# Patient Record
Sex: Female | Born: 1945 | Race: Black or African American | Hispanic: No | Smoking: Former smoker
Health system: Southern US, Community
[De-identification: ages and names within clinical notes are randomized; demographics above are authoritative.]

## PROBLEM LIST (undated history)

## (undated) DIAGNOSIS — J449 Chronic obstructive pulmonary disease, unspecified: Secondary | ICD-10-CM

## (undated) DIAGNOSIS — J45909 Unspecified asthma, uncomplicated: Secondary | ICD-10-CM

## (undated) DIAGNOSIS — K219 Gastro-esophageal reflux disease without esophagitis: Secondary | ICD-10-CM

## (undated) DIAGNOSIS — I10 Essential (primary) hypertension: Secondary | ICD-10-CM

## (undated) DIAGNOSIS — I639 Cerebral infarction, unspecified: Secondary | ICD-10-CM

## (undated) HISTORY — PX: TUBAL LIGATION: SHX77

---

## 2010-10-10 ENCOUNTER — Inpatient Hospital Stay: Payer: Self-pay | Admitting: Internal Medicine

## 2010-10-10 DIAGNOSIS — I517 Cardiomegaly: Secondary | ICD-10-CM

## 2014-11-18 DIAGNOSIS — I10 Essential (primary) hypertension: Secondary | ICD-10-CM | POA: Insufficient documentation

## 2014-11-18 DIAGNOSIS — I639 Cerebral infarction, unspecified: Secondary | ICD-10-CM | POA: Insufficient documentation

## 2014-11-19 DIAGNOSIS — J4520 Mild intermittent asthma, uncomplicated: Secondary | ICD-10-CM | POA: Insufficient documentation

## 2014-11-19 DIAGNOSIS — E782 Mixed hyperlipidemia: Secondary | ICD-10-CM | POA: Insufficient documentation

## 2014-11-19 DIAGNOSIS — G451 Carotid artery syndrome (hemispheric): Secondary | ICD-10-CM | POA: Insufficient documentation

## 2015-05-09 DIAGNOSIS — Z87891 Personal history of nicotine dependence: Secondary | ICD-10-CM | POA: Diagnosis not present

## 2015-05-09 DIAGNOSIS — J4520 Mild intermittent asthma, uncomplicated: Secondary | ICD-10-CM | POA: Diagnosis not present

## 2015-05-09 DIAGNOSIS — Z8673 Personal history of transient ischemic attack (TIA), and cerebral infarction without residual deficits: Secondary | ICD-10-CM | POA: Diagnosis not present

## 2015-05-09 DIAGNOSIS — Z880 Allergy status to penicillin: Secondary | ICD-10-CM | POA: Diagnosis not present

## 2015-05-09 DIAGNOSIS — E782 Mixed hyperlipidemia: Secondary | ICD-10-CM | POA: Diagnosis not present

## 2015-05-09 DIAGNOSIS — Z79899 Other long term (current) drug therapy: Secondary | ICD-10-CM | POA: Diagnosis not present

## 2015-05-09 DIAGNOSIS — R0600 Dyspnea, unspecified: Secondary | ICD-10-CM | POA: Diagnosis not present

## 2015-05-09 DIAGNOSIS — Z7951 Long term (current) use of inhaled steroids: Secondary | ICD-10-CM | POA: Diagnosis not present

## 2015-05-09 DIAGNOSIS — I6330 Cerebral infarction due to thrombosis of unspecified cerebral artery: Secondary | ICD-10-CM | POA: Diagnosis not present

## 2015-05-09 DIAGNOSIS — I10 Essential (primary) hypertension: Secondary | ICD-10-CM | POA: Diagnosis not present

## 2015-05-09 DIAGNOSIS — R0609 Other forms of dyspnea: Secondary | ICD-10-CM | POA: Diagnosis not present

## 2015-05-09 DIAGNOSIS — Z6841 Body Mass Index (BMI) 40.0 and over, adult: Secondary | ICD-10-CM | POA: Diagnosis not present

## 2015-05-09 DIAGNOSIS — Z7902 Long term (current) use of antithrombotics/antiplatelets: Secondary | ICD-10-CM | POA: Diagnosis not present

## 2015-05-09 DIAGNOSIS — G451 Carotid artery syndrome (hemispheric): Secondary | ICD-10-CM | POA: Diagnosis not present

## 2015-05-10 DIAGNOSIS — R0609 Other forms of dyspnea: Secondary | ICD-10-CM | POA: Diagnosis not present

## 2015-05-10 DIAGNOSIS — G4736 Sleep related hypoventilation in conditions classified elsewhere: Secondary | ICD-10-CM | POA: Diagnosis not present

## 2015-05-10 DIAGNOSIS — J4520 Mild intermittent asthma, uncomplicated: Secondary | ICD-10-CM | POA: Diagnosis not present

## 2015-05-10 DIAGNOSIS — G4733 Obstructive sleep apnea (adult) (pediatric): Secondary | ICD-10-CM | POA: Diagnosis not present

## 2015-05-18 DIAGNOSIS — R0602 Shortness of breath: Secondary | ICD-10-CM | POA: Diagnosis not present

## 2015-05-18 DIAGNOSIS — Z8673 Personal history of transient ischemic attack (TIA), and cerebral infarction without residual deficits: Secondary | ICD-10-CM | POA: Diagnosis not present

## 2015-05-18 DIAGNOSIS — I10 Essential (primary) hypertension: Secondary | ICD-10-CM | POA: Diagnosis not present

## 2015-05-18 DIAGNOSIS — I272 Other secondary pulmonary hypertension: Secondary | ICD-10-CM | POA: Diagnosis not present

## 2015-05-18 DIAGNOSIS — I2510 Atherosclerotic heart disease of native coronary artery without angina pectoris: Secondary | ICD-10-CM | POA: Diagnosis not present

## 2015-05-18 DIAGNOSIS — R0600 Dyspnea, unspecified: Secondary | ICD-10-CM | POA: Diagnosis not present

## 2015-05-18 DIAGNOSIS — E782 Mixed hyperlipidemia: Secondary | ICD-10-CM | POA: Diagnosis not present

## 2015-05-24 ENCOUNTER — Emergency Department (HOSPITAL_BASED_OUTPATIENT_CLINIC_OR_DEPARTMENT_OTHER)
Admission: EM | Admit: 2015-05-24 | Discharge: 2015-05-24 | Disposition: A | Discharge status: Home / Self Care | Payer: PPO | Attending: Emergency Medicine | Admitting: Emergency Medicine

## 2015-05-24 ENCOUNTER — Emergency Department (HOSPITAL_BASED_OUTPATIENT_CLINIC_OR_DEPARTMENT_OTHER): Payer: PPO

## 2015-05-24 ENCOUNTER — Encounter (HOSPITAL_BASED_OUTPATIENT_CLINIC_OR_DEPARTMENT_OTHER): Payer: Self-pay | Admitting: *Deleted

## 2015-05-24 DIAGNOSIS — Z8719 Personal history of other diseases of the digestive system: Secondary | ICD-10-CM | POA: Insufficient documentation

## 2015-05-24 DIAGNOSIS — R0789 Other chest pain: Secondary | ICD-10-CM | POA: Diagnosis not present

## 2015-05-24 DIAGNOSIS — I10 Essential (primary) hypertension: Secondary | ICD-10-CM | POA: Diagnosis not present

## 2015-05-24 DIAGNOSIS — Z8673 Personal history of transient ischemic attack (TIA), and cerebral infarction without residual deficits: Secondary | ICD-10-CM | POA: Diagnosis not present

## 2015-05-24 DIAGNOSIS — Z79899 Other long term (current) drug therapy: Secondary | ICD-10-CM | POA: Diagnosis not present

## 2015-05-24 DIAGNOSIS — R062 Wheezing: Secondary | ICD-10-CM | POA: Diagnosis not present

## 2015-05-24 DIAGNOSIS — R0602 Shortness of breath: Secondary | ICD-10-CM | POA: Diagnosis not present

## 2015-05-24 DIAGNOSIS — Z91040 Latex allergy status: Secondary | ICD-10-CM | POA: Diagnosis not present

## 2015-05-24 DIAGNOSIS — R05 Cough: Secondary | ICD-10-CM | POA: Diagnosis not present

## 2015-05-24 DIAGNOSIS — Z7902 Long term (current) use of antithrombotics/antiplatelets: Secondary | ICD-10-CM | POA: Diagnosis not present

## 2015-05-24 HISTORY — DX: Gastro-esophageal reflux disease without esophagitis: K21.9

## 2015-05-24 HISTORY — DX: Essential (primary) hypertension: I10

## 2015-05-24 HISTORY — DX: Cerebral infarction, unspecified: I63.9

## 2015-05-24 LAB — BASIC METABOLIC PANEL
Anion gap: 8 (ref 5–15)
BUN: 12 mg/dL (ref 6–20)
CHLORIDE: 99 mmol/L — AB (ref 101–111)
CO2: 27 mmol/L (ref 22–32)
Calcium: 8.6 mg/dL — ABNORMAL LOW (ref 8.9–10.3)
Creatinine, Ser: 0.83 mg/dL (ref 0.44–1.00)
GFR calc Af Amer: >60 mL/min (ref >60)
GFR calc non Af Amer: >60 mL/min (ref >60)
Glucose, Bld: 100 mg/dL — ABNORMAL HIGH (ref 65–99)
POTASSIUM: 3.7 mmol/L (ref 3.5–5.1)
SODIUM: 134 mmol/L — AB (ref 135–145)

## 2015-05-24 LAB — CBC WITH DIFFERENTIAL/PLATELET
Basophils Absolute: 0.1 10*3/uL (ref 0.0–0.1)
Basophils Relative: 1 %
EOS ABS: 0.3 10*3/uL (ref 0.0–0.7)
Eosinophils Relative: 5 %
HCT: 40 % (ref 36.0–46.0)
HEMOGLOBIN: 13.1 g/dL (ref 12.0–15.0)
LYMPHS PCT: 30 %
Lymphs Abs: 1.9 10*3/uL (ref 0.7–4.0)
MCH: 30.2 pg (ref 26.0–34.0)
MCHC: 32.8 g/dL (ref 30.0–36.0)
MCV: 92.2 fL (ref 78.0–100.0)
Monocytes Absolute: 0.9 10*3/uL (ref 0.1–1.0)
Monocytes Relative: 13 %
NEUTROS ABS: 3.4 10*3/uL (ref 1.7–7.7)
NEUTROS PCT: 51 %
Platelets: 365 10*3/uL (ref 150–400)
RBC: 4.34 MIL/uL (ref 3.87–5.11)
RDW: 11.8 % (ref 11.5–15.5)
WBC: 6.6 10*3/uL (ref 4.0–10.5)

## 2015-05-24 LAB — TROPONIN I

## 2015-05-24 MED ORDER — ALBUTEROL SULFATE HFA 108 (90 BASE) MCG/ACT IN AERS
2.0000 | INHALATION_SPRAY | RESPIRATORY_TRACT | Status: DC | PRN
  Administered 2015-05-24: 2 via RESPIRATORY_TRACT
  Filled 2015-05-24: qty 6.7

## 2015-05-24 MED ORDER — PREDNISONE 20 MG PO TABS: ORAL_TABLET | ORAL | Status: DC

## 2015-05-24 MED ORDER — ALBUTEROL SULFATE (2.5 MG/3ML) 0.083% IN NEBU
5.0000 mg | INHALATION_SOLUTION | Freq: Once | RESPIRATORY_TRACT | Status: AC
  Administered 2015-05-24: 5 mg via RESPIRATORY_TRACT
  Filled 2015-05-24: qty 6

## 2015-05-24 MED ORDER — METHYLPREDNISOLONE SODIUM SUCC 125 MG IJ SOLR
125.0000 mg | Freq: Once | INTRAMUSCULAR | Status: AC
  Administered 2015-05-24: 125 mg via INTRAVENOUS
  Filled 2015-05-24: qty 2

## 2015-05-24 NOTE — Progress Notes (Signed)
Patient stated that using the spacer with the albuterol MDI helped her get more of the medicine.
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
## 2015-05-24 NOTE — Discharge Instructions (Signed)
Take the prescribed medication as directed.  Continue using inhaler as needed. Follow-up with your primary care physician. Return to the ED for new or worsening symptoms.
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
## 2015-05-28 DIAGNOSIS — G4733 Obstructive sleep apnea (adult) (pediatric): Secondary | ICD-10-CM | POA: Diagnosis not present

## 2015-05-28 DIAGNOSIS — M810 Age-related osteoporosis without current pathological fracture: Secondary | ICD-10-CM | POA: Diagnosis not present

## 2015-05-28 DIAGNOSIS — G458 Other transient cerebral ischemic attacks and related syndromes: Secondary | ICD-10-CM | POA: Diagnosis not present

## 2015-05-28 DIAGNOSIS — R05 Cough: Secondary | ICD-10-CM | POA: Diagnosis not present

## 2015-05-28 DIAGNOSIS — I272 Other secondary pulmonary hypertension: Secondary | ICD-10-CM | POA: Diagnosis not present

## 2015-06-06 DIAGNOSIS — Z91040 Latex allergy status: Secondary | ICD-10-CM | POA: Diagnosis not present

## 2015-06-06 DIAGNOSIS — E782 Mixed hyperlipidemia: Secondary | ICD-10-CM | POA: Diagnosis not present

## 2015-06-06 DIAGNOSIS — I10 Essential (primary) hypertension: Secondary | ICD-10-CM | POA: Diagnosis not present

## 2015-06-06 DIAGNOSIS — I272 Other secondary pulmonary hypertension: Secondary | ICD-10-CM | POA: Diagnosis not present

## 2015-06-06 DIAGNOSIS — Z880 Allergy status to penicillin: Secondary | ICD-10-CM | POA: Diagnosis not present

## 2015-06-06 DIAGNOSIS — R0600 Dyspnea, unspecified: Secondary | ICD-10-CM | POA: Diagnosis not present

## 2015-06-06 DIAGNOSIS — Z7951 Long term (current) use of inhaled steroids: Secondary | ICD-10-CM | POA: Diagnosis not present

## 2015-06-06 DIAGNOSIS — I6330 Cerebral infarction due to thrombosis of unspecified cerebral artery: Secondary | ICD-10-CM | POA: Diagnosis not present

## 2015-06-06 DIAGNOSIS — Z87891 Personal history of nicotine dependence: Secondary | ICD-10-CM | POA: Diagnosis not present

## 2015-06-06 DIAGNOSIS — R0609 Other forms of dyspnea: Secondary | ICD-10-CM | POA: Diagnosis not present

## 2015-06-06 DIAGNOSIS — Z8673 Personal history of transient ischemic attack (TIA), and cerebral infarction without residual deficits: Secondary | ICD-10-CM | POA: Diagnosis not present

## 2015-06-06 DIAGNOSIS — Z79899 Other long term (current) drug therapy: Secondary | ICD-10-CM | POA: Diagnosis not present

## 2015-06-06 DIAGNOSIS — Z888 Allergy status to other drugs, medicaments and biological substances status: Secondary | ICD-10-CM | POA: Diagnosis not present

## 2015-06-06 DIAGNOSIS — Z7982 Long term (current) use of aspirin: Secondary | ICD-10-CM | POA: Diagnosis not present

## 2015-06-20 ENCOUNTER — Emergency Department (HOSPITAL_BASED_OUTPATIENT_CLINIC_OR_DEPARTMENT_OTHER)
Admission: EM | Admit: 2015-06-20 | Discharge: 2015-06-20 | Disposition: A | Discharge status: Home / Self Care | Payer: PPO | Attending: Emergency Medicine | Admitting: Emergency Medicine

## 2015-06-20 ENCOUNTER — Encounter (HOSPITAL_BASED_OUTPATIENT_CLINIC_OR_DEPARTMENT_OTHER): Payer: Self-pay

## 2015-06-20 DIAGNOSIS — Z7902 Long term (current) use of antithrombotics/antiplatelets: Secondary | ICD-10-CM | POA: Insufficient documentation

## 2015-06-20 DIAGNOSIS — I10 Essential (primary) hypertension: Secondary | ICD-10-CM | POA: Insufficient documentation

## 2015-06-20 DIAGNOSIS — Z8673 Personal history of transient ischemic attack (TIA), and cerebral infarction without residual deficits: Secondary | ICD-10-CM | POA: Diagnosis not present

## 2015-06-20 DIAGNOSIS — J9801 Acute bronchospasm: Secondary | ICD-10-CM | POA: Diagnosis not present

## 2015-06-20 DIAGNOSIS — R0602 Shortness of breath: Secondary | ICD-10-CM | POA: Diagnosis not present

## 2015-06-20 DIAGNOSIS — Z91040 Latex allergy status: Secondary | ICD-10-CM | POA: Diagnosis not present

## 2015-06-20 DIAGNOSIS — Z8719 Personal history of other diseases of the digestive system: Secondary | ICD-10-CM | POA: Insufficient documentation

## 2015-06-20 DIAGNOSIS — R05 Cough: Secondary | ICD-10-CM | POA: Diagnosis not present

## 2015-06-20 DIAGNOSIS — Z79899 Other long term (current) drug therapy: Secondary | ICD-10-CM | POA: Insufficient documentation

## 2015-06-20 MED ORDER — ALBUTEROL SULFATE (2.5 MG/3ML) 0.083% IN NEBU
2.5000 mg | INHALATION_SOLUTION | Freq: Once | RESPIRATORY_TRACT | Status: AC
  Administered 2015-06-20: 2.5 mg via RESPIRATORY_TRACT
  Filled 2015-06-20: qty 3

## 2015-06-20 MED ORDER — PREDNISONE 20 MG PO TABS: ORAL_TABLET | ORAL | Status: DC

## 2015-06-20 MED ORDER — ALBUTEROL SULFATE HFA 108 (90 BASE) MCG/ACT IN AERS
2.0000 | INHALATION_SPRAY | RESPIRATORY_TRACT | Status: DC | PRN
  Administered 2015-06-20: 2 via RESPIRATORY_TRACT
  Filled 2015-06-20: qty 6.7

## 2015-06-20 MED ORDER — IPRATROPIUM-ALBUTEROL 0.5-2.5 (3) MG/3ML IN SOLN
3.0000 mL | Freq: Once | RESPIRATORY_TRACT | Status: AC
  Administered 2015-06-20: 3 mL via RESPIRATORY_TRACT
  Filled 2015-06-20: qty 3

## 2015-06-20 NOTE — Discharge Instructions (Signed)
Bronchospasm, Adult  A bronchospasm is a spasm or tightening of the airways going into the lungs. During a bronchospasm breathing becomes more difficult because the airways get smaller. When this happens there can be coughing, a whistling sound when breathing (wheezing), and difficulty breathing. Bronchospasm is often associated with asthma, but not all patients who experience a bronchospasm have asthma.  CAUSES   A bronchospasm is caused by inflammation or irritation of the airways. The inflammation or irritation may be triggered by:   · Allergies (such as to animals, pollen, food, or mold). Allergens that cause bronchospasm may cause wheezing immediately after exposure or many hours later.    · Infection. Viral infections are believed to be the most common cause of bronchospasm.    · Exercise.    · Irritants (such as pollution, cigarette smoke, strong odors, aerosol sprays, and paint fumes).    · Weather changes. Winds increase molds and pollens in the air. Rain refreshes the air by washing irritants out. Cold air may cause inflammation.    · Stress and emotional upset.    SIGNS AND SYMPTOMS   · Wheezing.    · Excessive nighttime coughing.    · Frequent or severe coughing with a simple cold.    · Chest tightness.    · Shortness of breath.    DIAGNOSIS   Bronchospasm is usually diagnosed through a history and physical exam. Tests, such as chest X-rays, are sometimes done to look for other conditions.  TREATMENT   · Inhaled medicines can be given to open up your airways and help you breathe. The medicines can be given using either an inhaler or a nebulizer machine.  · Corticosteroid medicines may be given for severe bronchospasm, usually when it is associated with asthma.  HOME CARE INSTRUCTIONS   · Always have a plan prepared for seeking medical care. Know when to call your health care provider and local emergency services (911 in the [place] Know where you can access local emergency care.  · Only take medicines as  directed by your health care provider.  · If you were prescribed an inhaler or nebulizer machine, ask your health care provider to explain how to use it correctly. Always use a spacer with your inhaler if you were given one.  · It is necessary to remain calm during an attack. Try to relax and breathe more slowly.   · Control your home environment in the following ways:      Change your heating and air conditioning filter at least once a month.      Limit your use of fireplaces and wood stoves.    Do not smoke and do not allow smoking in your home.      Avoid exposure to perfumes and fragrances.      Get rid of pests (such as roaches and mice) and their droppings.      Throw away plants if you see mold on them.      Keep your house clean and dust free.      Replace carpet with wood, tile, or vinyl flooring. Carpet can trap dander and dust.      Use allergy-proof pillows, mattress covers, and box spring covers.      Wash bed sheets and blankets every week in hot water and dry them in a dryer.      Use blankets that are made of polyester or cotton.      Wash hands frequently.  SEEK MEDICAL CARE IF:   · You have muscle aches.    · You have chest pain.    · The sputum changes from clear or   white to yellow, green, gray, or bloody.    · The sputum you cough up gets thicker.    · There are problems that may be related to the medicine you are given, such as a rash, itching, swelling, or trouble breathing.    SEEK IMMEDIATE MEDICAL CARE IF:   · You have worsening wheezing and coughing even after taking your prescribed medicines.    · You have increased difficulty breathing.    · You develop severe chest pain.  MAKE SURE YOU:   · Understand these instructions.  · Will watch your condition.  · Will get help right away if you are not doing well or get worse.     This information is not intended to replace advice given to you by your health care provider. Make sure you discuss any questions you have with your health care  provider.     Document Released: 03/20/2003 Document Revised: 04/07/2014 Document Reviewed: 09/06/2012  Elsevier Interactive Patient Education ©2016 [employer]

## 2015-06-20 NOTE — ED Notes (Signed)
Pt c/o non productive cough with SOB x 2months, states has used her inhaler with no relief;

## 2015-06-20 NOTE — ED Provider Notes (Signed)
CSN: [id number]     Arrival date & time 06/20/15  0408 History   First MD Initiated Contact with Patient 06/20/15 0654     Chief Complaint  Patient presents with  . Shortness of Breath     (Consider location/radiation/quality/duration/timing/severity/associated sxs/prior Treatment) HPI This is a 70 year old female who has had a nonproductive cough and shortness of breath over the past 3 months. She was seen here a month ago for wheezing and shortness of breath and was treated with albuterol on a prednisone taper. Her breathing acutely worsened yesterday and she was not getting relief with her albuterol inhaler. She suspects her out inhaler is out. Symptoms were moderate to severe. She was evaluated by respiratory therapy who found her to have increased work of breathing and wheezing. She was given a total of 3 neb treatments with significant improvement and she has now comfortable and without wheezing. She states her chest is sore from coughing.  Past Medical History  Diagnosis Date  . Hypertension   . GERD (gastroesophageal reflux disease)   . Stroke [facility](H[facility])    Past Surgical History  Procedure Laterality Date  . Tubal ligation     No family history on file. Social History  Substance Use Topics  . Smoking status: Never Smoker   . Smokeless tobacco: None  . Alcohol Use: No   OB History    No data available     Review of Systems  All other systems reviewed and are negative.   Allergies  Latex  Home Medications   Prior to Admission medications   Medication Sig Start Date End Date Taking? Authorizing Provider  ALBUTEROL IN Inhale into the lungs.    Historical Provider, MD  AmLODIPine Besylate (NORVASC PO) Take by mouth.    Historical Provider, MD  Clopidogrel Bisulfate (PLAVIX PO) Take by mouth.    Historical Provider, MD  LOSARTAN POTASSIUM PO Take by mouth.    Historical Provider, MD  predniSONE (DELTASONE) 20 MG tablet Take 40 mg by mouth daily for 3 days, then 20mg  by  mouth daily for 3 days, then 10mg  daily for 3 days 05/24/15   Garlon HatchetLisa M Sanders, PA-C  tiotropium (SPIRIVA) 18 MCG inhalation capsule Place 18 mcg into inhaler and inhale daily.    Historical Provider, MD   BP 156/63 mmHg  Pulse 78  Temp(Src) 98.2 F (36.8 C) (Oral)  Resp 20  Ht 5\' 6"  (1.676 m)  Wt 268 lb (121.564 kg)  BMI 43.28 kg/m2  SpO2 95%   Physical Exam  General: Well-developed, well-nourished female in no acute distress; appearance consistent with age of record HENT: normocephalic; atraumatic Eyes: pupils equal, round and reactive to light; extraocular muscles intact Neck: supple Heart: regular rate and rhythm Lungs: clear to auscultation bilaterally Abdomen: soft; nondistended Extremities: No deformity; full range of motion; pulses normal Neurologic: Awake, alert and oriented; motor function intact in all extremities and symmetric; no facial droop Skin: Warm and dry Psychiatric: Normal mood and affect    ED Course  Procedures (including critical care time)   EKG Interpretation   Date/Time:  Wednesday June 20 2015 06:13:32 EDT Ventricular Rate:  82 PR Interval:  149 QRS Duration: 90 QT Interval:  385 QTC Calculation: 450 R Axis:   21 Text Interpretation:  Sinus rhythm Low voltage, precordial leads No  significant change was found Confirmed by Read DriversMOLPUS  MD, Jonny RuizJOHN ([id number]) on  06/20/2015 6:24:00 AM      MDM     Paula LibraJohn Case Vassell, MD 06/20/15  0701 

## 2015-06-26 DIAGNOSIS — I272 Other secondary pulmonary hypertension: Secondary | ICD-10-CM | POA: Diagnosis not present

## 2015-06-26 DIAGNOSIS — J449 Chronic obstructive pulmonary disease, unspecified: Secondary | ICD-10-CM | POA: Diagnosis not present

## 2015-06-26 DIAGNOSIS — J4520 Mild intermittent asthma, uncomplicated: Secondary | ICD-10-CM | POA: Diagnosis not present

## 2015-07-10 DIAGNOSIS — R0600 Dyspnea, unspecified: Secondary | ICD-10-CM | POA: Diagnosis not present

## 2015-07-10 DIAGNOSIS — R0602 Shortness of breath: Secondary | ICD-10-CM | POA: Diagnosis not present

## 2015-07-10 DIAGNOSIS — I5030 Unspecified diastolic (congestive) heart failure: Secondary | ICD-10-CM | POA: Diagnosis not present

## 2015-07-10 DIAGNOSIS — J449 Chronic obstructive pulmonary disease, unspecified: Secondary | ICD-10-CM | POA: Diagnosis not present

## 2015-07-10 DIAGNOSIS — I10 Essential (primary) hypertension: Secondary | ICD-10-CM | POA: Diagnosis not present

## 2015-07-10 DIAGNOSIS — G4733 Obstructive sleep apnea (adult) (pediatric): Secondary | ICD-10-CM | POA: Diagnosis not present

## 2015-07-10 DIAGNOSIS — I272 Other secondary pulmonary hypertension: Secondary | ICD-10-CM | POA: Diagnosis not present

## 2015-08-22 DIAGNOSIS — J449 Chronic obstructive pulmonary disease, unspecified: Secondary | ICD-10-CM | POA: Diagnosis not present

## 2015-08-22 DIAGNOSIS — I10 Essential (primary) hypertension: Secondary | ICD-10-CM | POA: Diagnosis not present

## 2015-08-22 DIAGNOSIS — E782 Mixed hyperlipidemia: Secondary | ICD-10-CM | POA: Diagnosis not present

## 2015-08-22 DIAGNOSIS — I272 Other secondary pulmonary hypertension: Secondary | ICD-10-CM | POA: Diagnosis not present

## 2015-08-22 DIAGNOSIS — I6330 Cerebral infarction due to thrombosis of unspecified cerebral artery: Secondary | ICD-10-CM | POA: Diagnosis not present

## 2015-08-22 DIAGNOSIS — I5033 Acute on chronic diastolic (congestive) heart failure: Secondary | ICD-10-CM | POA: Diagnosis not present

## 2015-09-10 DIAGNOSIS — G4733 Obstructive sleep apnea (adult) (pediatric): Secondary | ICD-10-CM | POA: Diagnosis not present

## 2015-10-05 DIAGNOSIS — K1120 Sialoadenitis, unspecified: Secondary | ICD-10-CM | POA: Diagnosis not present

## 2015-11-11 ENCOUNTER — Emergency Department (HOSPITAL_BASED_OUTPATIENT_CLINIC_OR_DEPARTMENT_OTHER)
Admission: EM | Admit: 2015-11-11 | Discharge: 2015-11-12 | Disposition: A | Discharge status: Home / Self Care | Payer: PPO | Attending: Emergency Medicine | Admitting: Emergency Medicine

## 2015-11-11 ENCOUNTER — Emergency Department (HOSPITAL_BASED_OUTPATIENT_CLINIC_OR_DEPARTMENT_OTHER): Payer: PPO

## 2015-11-11 ENCOUNTER — Encounter (HOSPITAL_BASED_OUTPATIENT_CLINIC_OR_DEPARTMENT_OTHER): Payer: Self-pay | Admitting: *Deleted

## 2015-11-11 DIAGNOSIS — I10 Essential (primary) hypertension: Secondary | ICD-10-CM | POA: Diagnosis not present

## 2015-11-11 DIAGNOSIS — J4520 Mild intermittent asthma, uncomplicated: Secondary | ICD-10-CM | POA: Diagnosis not present

## 2015-11-11 DIAGNOSIS — Z91040 Latex allergy status: Secondary | ICD-10-CM | POA: Diagnosis not present

## 2015-11-11 DIAGNOSIS — R0602 Shortness of breath: Secondary | ICD-10-CM | POA: Diagnosis not present

## 2015-11-11 LAB — CBC WITH DIFFERENTIAL/PLATELET
BASOS ABS: 0 10*3/uL (ref 0.0–0.1)
BASOS PCT: 0 %
EOS ABS: 0.2 10*3/uL (ref 0.0–0.7)
EOS PCT: 3 %
HEMATOCRIT: 37.7 % (ref 36.0–46.0)
HEMOGLOBIN: 12.3 g/dL (ref 12.0–15.0)
LYMPHS ABS: 2.4 10*3/uL (ref 0.7–4.0)
LYMPHS PCT: 35 %
MCH: 29.7 pg (ref 26.0–34.0)
MCHC: 32.6 g/dL (ref 30.0–36.0)
MCV: 91.1 fL (ref 78.0–100.0)
MONOS PCT: 11 %
Monocytes Absolute: 0.7 10*3/uL (ref 0.1–1.0)
Neutro Abs: 3.5 10*3/uL (ref 1.7–7.7)
Neutrophils Relative %: 51 %
Platelets: 289 10*3/uL (ref 150–400)
RBC: 4.14 MIL/uL (ref 3.87–5.11)
RDW: 12.4 % (ref 11.5–15.5)
WBC: 6.8 10*3/uL (ref 4.0–10.5)

## 2015-11-11 LAB — BASIC METABOLIC PANEL
ANION GAP: 6 (ref 5–15)
BUN: 13 mg/dL (ref 6–20)
CALCIUM: 8.8 mg/dL — AB (ref 8.9–10.3)
CO2: 27 mmol/L (ref 22–32)
Chloride: 105 mmol/L (ref 101–111)
Creatinine, Ser: 0.81 mg/dL (ref 0.44–1.00)
GFR calc non Af Amer: >60 mL/min (ref >60)
Glucose, Bld: 109 mg/dL — ABNORMAL HIGH (ref 65–99)
Potassium: 3.5 mmol/L (ref 3.5–5.1)
SODIUM: 138 mmol/L (ref 135–145)

## 2015-11-11 LAB — BRAIN NATRIURETIC PEPTIDE: B NATRIURETIC PEPTIDE 5: 27.8 pg/mL (ref 0.0–100.0)

## 2015-11-11 LAB — D-DIMER, QUANTITATIVE (NOT AT ARMC): D DIMER QUANT: 0.68 ug{FEU}/mL — AB (ref 0.00–0.50)

## 2015-11-11 LAB — TROPONIN I

## 2015-11-11 MED ORDER — ALBUTEROL SULFATE (2.5 MG/3ML) 0.083% IN NEBU
5.0000 mg | INHALATION_SOLUTION | Freq: Once | RESPIRATORY_TRACT | Status: AC
  Administered 2015-11-11: 5 mg via RESPIRATORY_TRACT
  Filled 2015-11-11: qty 6

## 2015-11-11 MED ORDER — IPRATROPIUM BROMIDE 0.02 % IN SOLN
0.5000 mg | Freq: Once | RESPIRATORY_TRACT | Status: AC
  Administered 2015-11-11: 0.5 mg via RESPIRATORY_TRACT
  Filled 2015-11-11: qty 2.5

## 2015-11-11 MED ORDER — IOPAMIDOL (ISOVUE-370) INJECTION 76%
100.0000 mL | Freq: Once | INTRAVENOUS | Status: AC | PRN
  Administered 2015-11-11: 100 mL via INTRAVENOUS

## 2015-11-11 MED ORDER — ALBUTEROL (5 MG/ML) CONTINUOUS INHALATION SOLN
10.0000 mg/h | INHALATION_SOLUTION | RESPIRATORY_TRACT | Status: DC
  Administered 2015-11-11: 10 mg/h via RESPIRATORY_TRACT
  Filled 2015-11-11: qty 20

## 2015-11-11 MED ORDER — ONDANSETRON HCL 4 MG/2ML IJ SOLN
4.0000 mg | Freq: Once | INTRAMUSCULAR | Status: AC
  Administered 2015-11-11: 4 mg via INTRAVENOUS
  Filled 2015-11-11: qty 2

## 2015-11-11 NOTE — ED Triage Notes (Signed)
Pt states that she felt like her neck and face were swelling last p.m. +nausea. Starting to feel the same tonight and decided to come in. +nausea as well

## 2015-11-11 NOTE — ED Notes (Signed)
EKG being done and [place], RRT in to assess.

## 2015-11-11 NOTE — ED Notes (Signed)
PA at bedside.

## 2015-11-11 NOTE — ED Provider Notes (Signed)
[facility] Provider Note   CSN: [id number] Arrival date & time: 11/11/15  2008  First Provider Contact:   First MD Initiated Contact with Patient 11/11/15 2024     By signing my name below, I, Autumn Young, attest that this documentation has been prepared under the direction and in the presence of Melburn Hake, PA-C. Electronically Signed: Angelene Giovanni, ED Scribe. 11/11/15. 8:38 PM.   History   Chief Complaint Chief Complaint  Patient presents with  . Shortness of Breath    HPI Comments: Autumn Young is a 70 y.o. female with a hx of Asthma, hypertension, GERD, and stroke who presents to the Emergency Department complaining of ongoing gradually worsening moderate shortness of breath onset 4 days ago. She notes that her symptoms are worse on exertion. She reports associated chest congestion and nausea. She states that she had associated neck swelling with facial swelling as well that occurred last night but notes it has improved today. Denies any new medications or foods. She adds that although her legs are swollen, it is gradually improving. No alleviating factors noted. She reports that she has tried albuterol inhaler with no relief. She denies that she is a current smoker. Pt states that she has not taken her Lasix for approx one month ago per advise of her PCP due to side effect. No new medications. She denies any fever, HA, neck stiffness, cough, lightheadedness, dizziness, chest pain, palpitations, abdominal pain, vomiting, or diarrhea, urinary sxs, numbness, tingling. Denies any recent hospitalization, surgery or long flights or car rides.   The history is provided by the patient. No language interpreter was used.    Past Medical History:  Diagnosis Date  . GERD (gastroesophageal reflux disease)   . Hypertension   . Stroke [facility])     There are no active problems to display for this patient.   Past Surgical History:  Procedure Laterality Date  . TUBAL  LIGATION      OB History    No data available       Home Medications    Prior to Admission medications   Medication Sig Start Date End Date Taking? Authorizing Provider  ALBUTEROL IN Inhale into the lungs.    Historical Provider, MD  AmLODIPine Besylate (NORVASC PO) Take by mouth.    Historical Provider, MD  Clopidogrel Bisulfate (PLAVIX PO) Take by mouth.    Historical Provider, MD  LOSARTAN POTASSIUM PO Take by mouth.    Historical Provider, MD  predniSONE (DELTASONE) 20 MG tablet Take 40 mg by mouth daily for 3 days, then  by mouth daily for 3 days, then  daily for 3 days 06/20/15   [name], MD  tiotropium (SPIRIVA) 18 MCG inhalation capsule Place 18 mcg into inhaler and inhale daily.    Historical Provider, MD    Family History History reviewed. No pertinent family history.  Social History Social History  Substance Use Topics  . Smoking status: Never Smoker  . Smokeless tobacco: Never Used  . Alcohol use No     Allergies   Latex   Review of Systems Review of Systems  Constitutional: Negative for fever.  HENT: Positive for congestion and facial swelling.   Respiratory: Positive for shortness of breath. Negative for cough.   Cardiovascular: Negative for chest pain.  Gastrointestinal: Positive for nausea. Negative for abdominal pain, diarrhea and vomiting.  Neurological: Negative for dizziness and light-headedness.  All other systems reviewed and are negative.    Physical Exam Updated Vital Signs  BP 166/68 (BP Location: Left Arm)   Pulse 71   Temp 97.6 F (36.4 C) (Oral)   Resp 18   Ht 5\' 6"  (1.676 m)   Wt 135.2 kg   SpO2 94%   BMI 48.10 kg/m   Physical Exam  Constitutional: She is oriented to person, place, and time. She appears well-developed and well-nourished.  Non-toxic appearance. She does not have a sickly appearance. She does not appear ill.  HENT:  Head: Normocephalic and atraumatic.  Mouth/Throat: Uvula is midline, oropharynx is  clear and moist and mucous membranes are normal. No oropharyngeal exudate, posterior oropharyngeal edema, posterior oropharyngeal erythema or tonsillar abscesses.  No facial or neck swelling noted.   Eyes: Conjunctivae are normal.  Neck: Normal range of motion. Neck supple.  Cardiovascular: Normal rate, regular rhythm, normal heart sounds and intact distal pulses.   Pulmonary/Chest: Effort normal. No accessory muscle usage or stridor. No respiratory distress. She has decreased breath sounds. She has wheezes. She has no rhonchi. She has no rales.  Decreased breath sounds throughout with mild end expiratory wheezes noted in basilar lobes.   Abdominal: Soft. Bowel sounds are normal. She exhibits no distension. There is no tenderness.  Musculoskeletal: Normal range of motion.  Mild non-pitting edema to BLE   Lymphadenopathy:    She has no cervical adenopathy.  Neurological: She is alert and oriented to person, place, and time.  Speech clear without dysarthria.  Skin: Skin is warm and dry.  Psychiatric: She has a normal mood and affect. Her behavior is normal.     ED Treatments / Results  DIAGNOSTIC STUDIES: Oxygen Saturation is 96% on RA, normal by my interpretation.    COORDINATION OF CARE: 8:31 PM- Pt advised of plan for treatment and pt agrees. Pt will receive breathing treatment. She will also receive chest x-ray for further evaluation.    Labs (all labs ordered are listed, but only abnormal results are displayed) Labs Reviewed  BASIC METABOLIC PANEL - Abnormal; Notable for the following:       Result Value   Glucose, Bld 109 (*)    Calcium 8.8 (*)    All other components within normal limits  D-DIMER, QUANTITATIVE (NOT AT [facility]) - Abnormal; Notable for the following:    D-Dimer, Quant 0.68 (*)    All other components within normal limits  CBC WITH DIFFERENTIAL/PLATELET  TROPONIN I  BRAIN NATRIURETIC PEPTIDE    EKG  EKG Interpretation  Date/Time:  Sunday November 11 2015  20:31:32 EDT Ventricular Rate:  63 PR Interval:    QRS Duration: 90 QT Interval:  416 QTC Calculation: 426 R Axis:   32 Text Interpretation:  Sinus rhythm Low voltage, precordial leads No significant change. Confirmed by Fayrene FearingJAMES  MD, MARK ([id number]) on 11/11/2015 8:35:47 PM       Radiology Dg Chest 2 View  Result Date: 11/11/2015 CLINICAL DATA:  Increased shortness of breath for 4 days. Chest soreness. EXAM: CHEST  2 VIEW COMPARISON:  05/24/2015 FINDINGS: Tortuous thoracic aorta. Mild enlargement of the cardiopericardial silhouette, without edema. Mild central airway thickening. Lung volumes normal. Mild thoracic spondylosis.  No airspace opacity identified. IMPRESSION: 1. Airway thickening is present, suggesting bronchitis or reactive airways disease. 2. Mild enlargement of the cardiopericardial silhouette, without edema. Electronically Signed   By: Gaylyn RongWalter  Liebkemann M.D.   On: 11/11/2015 20:53   Ct Angio Chest Pe W And/or Wo Contrast  Result Date: 11/11/2015 CLINICAL DATA:  Acute onset of shortness of breath, worse with exertion.  Chest congestion and nausea. Neck swelling and facial swelling. Lower extremity edema. Initial encounter. EXAM: CT ANGIOGRAPHY CHEST WITH CONTRAST TECHNIQUE: Multidetector CT imaging of the chest was performed using the standard protocol during bolus administration of intravenous contrast. Multiplanar CT image reconstructions and MIPs were obtained to evaluate the vascular anatomy. CONTRAST:  100 mL of Isovue 370 IV contrast COMPARISON:  Chest radiograph performed earlier today at 8:44 p.m. FINDINGS: There is no evidence of significant pulmonary embolus. Mild bibasilar atelectasis is noted. The lungs are otherwise clear. There is no evidence of significant focal consolidation, pleural effusion or pneumothorax. No masses are identified; no abnormal focal contrast enhancement is seen. The mediastinum is unremarkable in appearance. Scattered coronary artery calcification is  noted. No mediastinal lymphadenopathy is seen. No pericardial effusion is identified. The great vessels are grossly unremarkable in appearance. No axillary lymphadenopathy is seen. The visualized portions of the thyroid gland are unremarkable in appearance. The visualized portions of the liver and spleen are unremarkable. No acute osseous abnormalities are seen. Mild degenerative change is noted along the lower cervical spine. Review of the MIP images confirms the above findings. IMPRESSION: 1. No evidence of significant pulmonary embolus. 2. Mild bibasilar atelectasis noted.  Lungs otherwise clear. 3. Scattered coronary artery calcifications seen. Electronically Signed   By: Roanna Raider M.D.   On: 11/11/2015 23:42    Procedures Procedures (including critical care time)  Medications Ordered in ED Medications  albuterol (PROVENTIL,VENTOLIN) solution continuous neb (0 mg/hr Nebulization Stopped 11/11/15 2302)  albuterol (PROVENTIL) (2.5 MG/3ML) 0.083% nebulizer solution 5 mg (5 mg Nebulization Given 11/11/15 2046)  ipratropium (ATROVENT) nebulizer solution 0.5 mg (0.5 mg Nebulization Given 11/11/15 2157)  iopamidol (ISOVUE-370) 76 % injection 100 mL (100 mLs Intravenous Contrast Given 11/11/15 2307)  ondansetron (ZOFRAN) injection 4 mg (4 mg Intravenous Given 11/11/15 2330)     Initial Impression / Assessment and Plan / ED Course  Melburn Hake, PA-C has reviewed the triage vital signs and the nursing notes.  Pertinent labs & imaging results that were available during my care of the patient were reviewed by me and considered in my medical decision making (see chart for details).  Clinical Course    Patient presents with shortness of breath for the past 4 days with associated nausea, chest congestion and reported facial swelling that occurred last night but has improved today. History of asthma. No relief with albuterol inhaler at home. Denies fever or chest pain. VSS. Exam revealed decreased  breath sounds throughout with mild end expiratory wheezes noted and basilar lobes. No facial or neck swelling noted. Mild nonpitting edema noted to bilateral lower extremities. Remaining exam unremarkable. Patient given albuterol neb treatment in the ED.  EKG showed sinus rhythm, no significant changes from prior. Negative troponin. Chest x-ray showed airway thickening and mild enlargement of cardiac silhouette without edema. D-dimer 0.68, full order CT anterior chest for further evaluation of PE. BNP 27.8. Remaining labs unremarkable. On reevaluation patient reports her shortness of breath has improved. Reevaluation of lungs reveals mild improvement of airflow in bilateral lobes however there she still continues to have decreased breath sounds throughout. Pt given continuous neb tx of albuterol with atrovent. CT Angio negative. .Reevaluation patient reports her shortness of breath has completely resolved.  Lung exam revealed signiificant improvement of air flow in bilateral lobes, no wheezing. O2 sat 95% on RA. Patient able to tolerate PO. I suspect patient's symptoms are likely due to asthma exacerbation. I have a low suspicion for ACS,  PE, dissection, or other acute cardiac event at this time.  Plan to discharge patient home with symptomatic treatment advised her to continue using her albuterol inhaler as needed. Advised patient to follow up with PCP. Discussed return precautions.  Final Clinical Impressions(s) / ED Diagnoses   Final diagnoses:  Asthma, mild intermittent, uncomplicated   ,I personally performed the services described in this documentation, which was scribed in my presence. The recorded information has been reviewed and is accurate.   New Prescriptions New Prescriptions   No medications on file     Barrett Henle, [place] 11/12/15 0035    Rolland Porter, MD 11/13/15 1153

## 2015-11-11 NOTE — ED Notes (Signed)
Pt noted to be gagging and spitting into emesis bag, not producing any emesis. [place], PA in to see pt.

## 2015-11-12 NOTE — Discharge Instructions (Signed)
Continue taking your home medications as prescribed. Continue using your albuterol inhaler as needed for shortness of breath or wheezing. Please follow up with a primary care provider from the Resource Guide provided below in one week for reevaluation. Please return to the Emergency Department if symptoms worsen or new onset of fever, difficulty breathing, coughing up blood, vomiting, unable to keep fluids down, chest pain, lightheadedness, dizziness, syncope.

## 2015-11-28 ENCOUNTER — Ambulatory Visit: Payer: PPO | Attending: Critical Care Medicine | Admitting: Critical Care Medicine

## 2015-11-28 ENCOUNTER — Encounter: Payer: Self-pay | Admitting: Critical Care Medicine

## 2015-11-28 VITALS — BP 150/86 | HR 64 | Temp 98.3°F | Resp 18 | Wt 302.0 lb

## 2015-11-28 DIAGNOSIS — J209 Acute bronchitis, unspecified: Secondary | ICD-10-CM

## 2015-11-28 DIAGNOSIS — Z79899 Other long term (current) drug therapy: Secondary | ICD-10-CM | POA: Insufficient documentation

## 2015-11-28 DIAGNOSIS — J42 Unspecified chronic bronchitis: Secondary | ICD-10-CM | POA: Insufficient documentation

## 2015-11-28 DIAGNOSIS — J4489 Other specified chronic obstructive pulmonary disease: Secondary | ICD-10-CM

## 2015-11-28 DIAGNOSIS — J449 Chronic obstructive pulmonary disease, unspecified: Secondary | ICD-10-CM | POA: Diagnosis not present

## 2015-11-28 DIAGNOSIS — J4540 Moderate persistent asthma, uncomplicated: Secondary | ICD-10-CM

## 2015-11-28 DIAGNOSIS — G4733 Obstructive sleep apnea (adult) (pediatric): Secondary | ICD-10-CM

## 2015-11-28 MED ORDER — BUDESONIDE-FORMOTEROL FUMARATE 160-4.5 MCG/ACT IN AERO: 2.0000 | INHALATION_SPRAY | Freq: Two times a day (BID) | RESPIRATORY_TRACT | 12 refills | Status: DC

## 2015-11-28 MED ORDER — IPRATROPIUM-ALBUTEROL 0.5-2.5 (3) MG/3ML IN SOLN: 3.0000 mL | Freq: Once | RESPIRATORY_TRACT | Status: DC

## 2015-11-28 MED FILL — SYMBICORT 160-4.5 MCG INH: 160-4.5 | 20 days supply | Qty: 10 | Fill #0

## 2015-11-28 NOTE — Assessment & Plan Note (Signed)
Severe OSA on recent study Plan Start cpap with autotitration

## 2015-11-28 NOTE — Assessment & Plan Note (Signed)
Copd with AECOPD, improved airway inflammation  Plan Cont inhaled meds Taper prednisone Cont spiriva/duoneb Needs cpap

## 2015-11-28 NOTE — Progress Notes (Signed)
Subjective:    Patient ID: Autumn Young, female    DOB: 1945-06-21, 70 y.o.   MRN: [id number]  hosp f/u for asthma flare .  Dx of this x 1 year.  In ED twice in 6 mo for asthma. Also hx of osa, never got cpap   Asthma  She complains of chest tightness, cough, difficulty breathing, hoarse voice, shortness of breath, sputum production and wheezing. There is no frequent throat clearing or hemoptysis. This is a recurrent problem. The current episode started more than 1 year ago. The problem occurs 2 to 4 times per day. The problem has been gradually worsening. The cough is productive of sputum, productive of brown sputum, productive, hacking and hoarse. Associated symptoms include dyspnea on exertion, ear pain, heartburn, PND and a sore throat. Pertinent negatives include no fever, nasal congestion, orthopnea or postnasal drip. Her symptoms are aggravated by exposure to fumes, lying down, change in weather, pollen, emotional stress, climbing stairs and eating. Her symptoms are alleviated by beta-agonist and oral steroids. She reports moderate improvement on treatment. Her past medical history is significant for asthma.    Past Medical History:  Diagnosis Date  . GERD (gastroesophageal reflux disease)   . Hypertension   . Stroke [facility])      No family history on file.   Social History   Social History  . Marital status: Divorced    Spouse name: N/A  . Number of children: N/A  . Years of education: N/A   Occupational History  . Not on file.   Social History Main Topics  . Smoking status: Never Smoker  . Smokeless tobacco: Never Used  . Alcohol use No  . Drug use: No  . Sexual activity: Not on file   Other Topics Concern  . Not on file   Social History Narrative  . No narrative on file     Allergies  Allergen Reactions  . Simvastatin Swelling  . Latex     rash  . Penicillins Rash     Outpatient Medications Prior to Visit  Medication Sig Dispense Refill  . ALBUTEROL IN  Inhale into the lungs.    . AmLODIPine Besylate (NORVASC PO) Take by mouth.    . Clopidogrel Bisulfate (PLAVIX PO) Take 75 mg by mouth daily.     Marland Kitchen LOSARTAN POTASSIUM PO Take by mouth.    . predniSONE (DELTASONE) 20 MG tablet Take 40 mg by mouth daily for 3 days, then 20mg  by mouth daily for 3 days, then 10mg  daily for 3 days 12 tablet 0  . tiotropium (SPIRIVA) 18 MCG inhalation capsule Place 18 mcg into inhaler and inhale daily.     No facility-administered medications prior to visit.      Review of Systems  Constitutional: Positive for fatigue. Negative for fever.  HENT: Positive for ear pain, hoarse voice and sore throat. Negative for postnasal drip.   Respiratory: Positive for apnea, cough, sputum production, chest tightness, shortness of breath and wheezing. Negative for hemoptysis.        Hx of osa, never got cpap  Cardiovascular: Positive for dyspnea on exertion and PND.  Gastrointestinal: Positive for heartburn.       Objective:   Physical Exam Vitals:   11/28/15 1045  BP: (!) 150/86  Pulse: 64  Resp: 18  Temp: 98.3 F (36.8 C)  TempSrc: Oral  SpO2: 96%  Weight: (!) 137 kg (302 lb)    Gen: Pleasant, obese , in no distress,  normal affect  ENT: No lesions,  mouth clear,  oropharynx clear, no postnasal drip  Neck: No JVD, no TMG, no carotid bruits  Lungs: No use of accessory muscles, no dullness to percussion, distant BS, poor airflow  Cardiovascular: RRR, heart sounds normal, no murmur or gallops, no peripheral edema  Abdomen: soft and NT, no HSM,  BS normal  Musculoskeletal: No deformities, no cyanosis or clubbing  Neuro: alert, non focal  Skin: Warm, no lesions or rashes  No results found.        Assessment & Plan:  I personally reviewed all images and lab data in [facility] system as well as any outside material available during this office visit and agree with the  radiology impressions.   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  Diagnoses and all orders for this visit:  COPD with chronic bronchitis (HCC)  Acute bronchitis, unspecified organism -     Discontinue: ipratropium-albuterol (DUONEB) 0.5-2.5 (3) MG/3ML nebulizer solution 3 mL; Take 3 mLs by nebulization once.  OSA (obstructive sleep apnea) -     For home use only DME continuous positive airway pressure (CPAP)  Asthma, moderate persistent, uncomplicated  COPD (chronic obstructive pulmonary disease) with chronic bronchitis (HCC)  Other orders -     budesonide-formoterol (SYMBICORT) 160-4.5 MCG/ACT inhaler; Inhale 2 puffs into the lungs 2 (two) times daily.

## 2015-11-28 NOTE — Patient Instructions (Signed)
Stop spiriva Use albuterol as needed Use Duoneb in nebulizer every 6 hours as needed [facility] No more prednisone No other changes Return 12/26/15

## 2015-11-28 NOTE — Progress Notes (Signed)
Patient is here for HFU  Patient denies pain at this time.  Patient has not taken medicaiton today.

## 2015-12-19 ENCOUNTER — Encounter: Payer: Self-pay | Admitting: Critical Care Medicine

## 2015-12-19 ENCOUNTER — Other Ambulatory Visit: Payer: Self-pay | Admitting: Critical Care Medicine

## 2015-12-19 DIAGNOSIS — G4733 Obstructive sleep apnea (adult) (pediatric): Secondary | ICD-10-CM

## 2015-12-19 NOTE — Progress Notes (Signed)
Sleep Study from [facility]  IDENTIFICATION  File Name: [id number] Last Name: Young First Name: Autumn P. Rec. Date/Time: 09/10/2015 22:20 MR#: [id number] MSLT: No  Date of Birth: 10/14/1945 Height: 66.0 Age: 70 yrs6 mos Weight: 280.0 Sex: F BMI: 45.3   Sleep study interpreting Phys.: Heidi L. Lucina Mellow, MD Fellow: Joette Catching MD  Referring Phys.: MUDDASIR Theador Hawthorne Date Dictated: September 12, 2015  HISTORY   This is a CPAP titration study.The patient is a 8 yrs6 mos old, who  is being evaluated for a sleep-related breathing disorder.   Other Known Medical Issues:LATEX,CVA 2016,HEMISPHERIC CARTOID ARTERY  SYNDROME,MIXED HYPERLIPIDEMIA,mild intermittent asthma without  complication,exertional dyspnea,chronic cough,htn,tremor,leg  numbness,morbid obesity,gerd,night swats,ca disease,pulmonary htn.  Medications:pantroprozole,albuterol,Spiriva,Plavix,hyzaar,Advair,norvasc Epworth Sleepiness Scale (total score)1 Routine Bedtime:2300 Routine Rise Time:0430 Previous Night'sBedtime: 2235 Rise Time:0445Typical -Sleep Daytime Activity  Caffeine Intake:NO  Naps:YES/1 HRS(total minutes)  Alcohol:NO Exercise: YES/PM(time of day) Anything happen out of the ordinary:NO Do you need to be up by a certain time tomorrow?: How do you feel tonight?:GOOD Any nasal congestion?NO  PROCEDURE   A multi-channel polysomnogram was recorded digitally and stored using a  [employer] polygraph.The input montage provided multiple recording  channels of central, temporal and occipital EEG, EOG, submental EMG, arm  and leg limb surface EMG, airflow from nasal pressure and nasal/oral  thermocouple, intercostal EMG, chest and abdominal movement via  respiratory impedance plethysmography belts, end tidal CO2 via a BCI  capnograph sampled through a nasal cannula, arterial blood oxygen via a  finger probe, and EKG via a modified Lead I.The polysomnograph was  reviewed in  multiple montages.Time locked digital video was recorded  with the polysomnogram and selected segments reviewed.The study was  scored using the AASM 2016 guidelines.  STUDY RESULTS Sleep Description: The study started at 09/10/2015 22:20 and ended at 09/11/2015 7:07.At the  time of initiation of the study the heart was 62 bpm, respiratory rate was  16 bpm, end tidal CO2 was 44 torr and the oxygen saturation was 94%.  The total recording time was 371.5 minutes with a total sleep time of 327  minutes.The patient's sleep latency was 8.5 minute(s) with 36.0  minute(s) of wake time recorded after sleep onset.The REM latency was88  minutes. The sleep efficiency was 88.0%. Total wake time during the night was44.5 minute(s), which was 12.0% of  the total recording time.The sleep stage percentages are as follows:  Stage N1=6.4%, Stage N2= 62.5%, Stage N3=7.2%, and Stage R (REM  sleep)= 23.9%.The overall sleep architecture showed the majority of slow  wave sleep to be in the early part of the night and the majority of the  REM sleep to be in the latter part of the night.  Respiratory The patient was titrated to CPAP levels of5.0,6.0,7.0,9.0, 11.0,  13.0, 14.0, cm H20. At the final pressure of CPAP 14 no significant flow  limitation and apneas were noted.  The patient spent 54 minute(s) in the supine position, and 273.0 minute(s)  in the lateral position. The study included 0 minutes of REM supine  sleep.  The AHI on PAP pressure of5.0was 6.1 per hour; on PAP pressure of 6.0 was 0 per hour; on PAP pressure of7.0 was20.5 per hour; on PAP  pressure of9.0 was 9.9 per hour; on PAP pressure of 11.0 was 6.4 per  hour; on PAP pressure of 13.0 was 7.1 per hour; on PAP pressure of 14.0  was 0 per hour.  For the entire night, the patient had a total of 40 respiratory event(s)  for an AHI of 7.3 per hour. There were 9 obstructive apnea(s), 0  mixed  apnea(s), 1 central apnea(s) and 30 hypopnea(s).16 event(s) occurred in  Stage REM, 24 event(s) were noted in NREM.Total AI was1.8. These  respiratory events were associated with arousals and oxygen desaturation  to a low of 86%.The maximal end tidal CO2 during sleep was51.6 torr. A total of 0 RERAs were noted for a RDI of 7.3.Cheyne Stokes was not  noted.   The patient spent 249 minutes in NREM sleep with 24 events during NREM  sleep.The NREM AHI is5.8.   The patient spent 78 minute(s) in REM sleep, of which 0 minutes were  supine and 78 minutes were lateral. 16 event(s) occurred during REM sleep.   The REM AHI is 12.3, REM supine AHI is 0 , and REM lateral AHI is12.3.  The average O2 saturation (SpO2) for the night was 92.4.For 95.3% of the  night at saturation over 90% was monitored, for 4.6% of the night the  saturation ranged between 80% and 90% and 0.0% of the night was spent at a  saturation between 50% and 80%.The total time spent with O2 saturation  =<88% was 5.1 minutes.  The average end tidal C02 for the night was38.0 torr, with a total of  0.6 minutes (0.2% of the night) spent at level of 50 torr or above.  Limb Movements There were a total of 33 periodic limb movement events with a PLM Index of   6.1, 5 of which were associated with arousals, which calculated to 0.9  event(s) per hour during sleep.29 spontaneous arousal(s) were noted with  an index of 5.3 arousal(s) per hour of sleep.   Other Associated Events The average heart rate in sleep was 59 with the average in REM sleep 56  and NREM 59. The peak heart rate in sleep was 80. The patient had no  events of gastroesophageal reflux, cardiac arrhythmias or epileptiform  activity on routine review of the study.The patient had no events  suggestive of parasomnia disorder and no events of bruxism were noted.   IMPRESSION:  1. The patient was recently documented to have  sleep apnea (AHI = 33; [facility]  PSG 05/11/2015). In this present PAP titration study, CPAP 14 cm H20 with  expiratory pressure relief (EPR) of 3 corrected respiration.  2. Episodes of Nocturnal coughing noted.    RECOMMENDATIONS:  1. CPAP 14 with EPR (expiratory pressure relief) =3. Heated humidity for  nasal dryness. Mask: [employer]: SIMPLUS SIZE MED FULL FACE MASK.  2. The patient should be questioned about potential etiologies of  Nocturnal coughing such as GERD, asthma, post-nasal drip, or others.  _____________________________________________M.D.  Heidi L. Lucina Mellowoth, MD   Diplomate of Sleep Medicine, ABPN

## 2016-04-29 DIAGNOSIS — G5602 Carpal tunnel syndrome, left upper limb: Secondary | ICD-10-CM | POA: Diagnosis not present

## 2016-04-29 DIAGNOSIS — M79645 Pain in left finger(s): Secondary | ICD-10-CM | POA: Diagnosis not present

## 2016-06-27 DIAGNOSIS — I10 Essential (primary) hypertension: Secondary | ICD-10-CM | POA: Diagnosis not present

## 2016-06-27 DIAGNOSIS — Z79899 Other long term (current) drug therapy: Secondary | ICD-10-CM | POA: Diagnosis not present

## 2016-06-27 DIAGNOSIS — J42 Unspecified chronic bronchitis: Secondary | ICD-10-CM | POA: Diagnosis not present

## 2016-06-27 DIAGNOSIS — R05 Cough: Secondary | ICD-10-CM | POA: Diagnosis not present

## 2016-06-27 DIAGNOSIS — Z87891 Personal history of nicotine dependence: Secondary | ICD-10-CM | POA: Diagnosis not present

## 2016-06-27 DIAGNOSIS — M7989 Other specified soft tissue disorders: Secondary | ICD-10-CM | POA: Diagnosis not present

## 2016-06-27 DIAGNOSIS — R945 Abnormal results of liver function studies: Secondary | ICD-10-CM | POA: Diagnosis not present

## 2016-06-27 DIAGNOSIS — E785 Hyperlipidemia, unspecified: Secondary | ICD-10-CM | POA: Diagnosis not present

## 2016-06-27 DIAGNOSIS — R079 Chest pain, unspecified: Secondary | ICD-10-CM | POA: Diagnosis not present

## 2016-06-27 DIAGNOSIS — R600 Localized edema: Secondary | ICD-10-CM | POA: Diagnosis not present

## 2016-06-27 DIAGNOSIS — Z7902 Long term (current) use of antithrombotics/antiplatelets: Secondary | ICD-10-CM | POA: Diagnosis not present

## 2016-06-27 DIAGNOSIS — J449 Chronic obstructive pulmonary disease, unspecified: Secondary | ICD-10-CM | POA: Diagnosis not present

## 2016-06-27 DIAGNOSIS — I4581 Long QT syndrome: Secondary | ICD-10-CM | POA: Diagnosis not present

## 2016-06-27 DIAGNOSIS — I272 Pulmonary hypertension, unspecified: Secondary | ICD-10-CM | POA: Diagnosis not present

## 2016-06-27 DIAGNOSIS — Z8673 Personal history of transient ischemic attack (TIA), and cerebral infarction without residual deficits: Secondary | ICD-10-CM | POA: Diagnosis not present

## 2016-06-27 DIAGNOSIS — R0602 Shortness of breath: Secondary | ICD-10-CM | POA: Diagnosis not present

## 2016-06-27 DIAGNOSIS — G459 Transient cerebral ischemic attack, unspecified: Secondary | ICD-10-CM | POA: Diagnosis not present

## 2016-06-27 DIAGNOSIS — R7989 Other specified abnormal findings of blood chemistry: Secondary | ICD-10-CM | POA: Diagnosis not present

## 2016-06-27 DIAGNOSIS — Z7982 Long term (current) use of aspirin: Secondary | ICD-10-CM | POA: Diagnosis not present

## 2016-06-28 DIAGNOSIS — I272 Pulmonary hypertension, unspecified: Secondary | ICD-10-CM | POA: Diagnosis not present

## 2016-06-28 DIAGNOSIS — R7989 Other specified abnormal findings of blood chemistry: Secondary | ICD-10-CM | POA: Diagnosis not present

## 2016-06-28 DIAGNOSIS — I10 Essential (primary) hypertension: Secondary | ICD-10-CM | POA: Diagnosis not present

## 2016-06-28 DIAGNOSIS — R079 Chest pain, unspecified: Secondary | ICD-10-CM | POA: Diagnosis not present

## 2016-06-28 DIAGNOSIS — M7989 Other specified soft tissue disorders: Secondary | ICD-10-CM | POA: Diagnosis not present

## 2016-06-28 DIAGNOSIS — J449 Chronic obstructive pulmonary disease, unspecified: Secondary | ICD-10-CM | POA: Diagnosis not present

## 2016-06-28 DIAGNOSIS — R0602 Shortness of breath: Secondary | ICD-10-CM | POA: Diagnosis not present

## 2016-06-28 DIAGNOSIS — Z8673 Personal history of transient ischemic attack (TIA), and cerebral infarction without residual deficits: Secondary | ICD-10-CM | POA: Diagnosis not present

## 2016-07-14 DIAGNOSIS — I272 Pulmonary hypertension, unspecified: Secondary | ICD-10-CM | POA: Diagnosis not present

## 2016-07-14 DIAGNOSIS — G459 Transient cerebral ischemic attack, unspecified: Secondary | ICD-10-CM | POA: Diagnosis not present

## 2016-07-14 DIAGNOSIS — I10 Essential (primary) hypertension: Secondary | ICD-10-CM | POA: Diagnosis not present

## 2016-07-14 DIAGNOSIS — J449 Chronic obstructive pulmonary disease, unspecified: Secondary | ICD-10-CM | POA: Diagnosis not present

## 2016-10-15 DIAGNOSIS — I6330 Cerebral infarction due to thrombosis of unspecified cerebral artery: Secondary | ICD-10-CM | POA: Diagnosis not present

## 2016-10-15 DIAGNOSIS — I5032 Chronic diastolic (congestive) heart failure: Secondary | ICD-10-CM | POA: Diagnosis not present

## 2016-10-15 DIAGNOSIS — I272 Pulmonary hypertension, unspecified: Secondary | ICD-10-CM | POA: Diagnosis not present

## 2016-10-15 DIAGNOSIS — I10 Essential (primary) hypertension: Secondary | ICD-10-CM | POA: Diagnosis not present

## 2016-10-15 DIAGNOSIS — J449 Chronic obstructive pulmonary disease, unspecified: Secondary | ICD-10-CM | POA: Diagnosis not present

## 2016-12-03 ENCOUNTER — Emergency Department (HOSPITAL_BASED_OUTPATIENT_CLINIC_OR_DEPARTMENT_OTHER): Payer: PPO

## 2016-12-03 ENCOUNTER — Encounter (HOSPITAL_BASED_OUTPATIENT_CLINIC_OR_DEPARTMENT_OTHER): Payer: Self-pay | Admitting: Emergency Medicine

## 2016-12-03 ENCOUNTER — Emergency Department (HOSPITAL_BASED_OUTPATIENT_CLINIC_OR_DEPARTMENT_OTHER)
Admission: EM | Admit: 2016-12-03 | Discharge: 2016-12-03 | Disposition: A | Discharge status: Home / Self Care | Payer: PPO | Attending: Emergency Medicine | Admitting: Emergency Medicine

## 2016-12-03 DIAGNOSIS — I110 Hypertensive heart disease with heart failure: Secondary | ICD-10-CM | POA: Diagnosis not present

## 2016-12-03 DIAGNOSIS — I5032 Chronic diastolic (congestive) heart failure: Secondary | ICD-10-CM | POA: Diagnosis not present

## 2016-12-03 DIAGNOSIS — J181 Lobar pneumonia, unspecified organism: Secondary | ICD-10-CM | POA: Diagnosis not present

## 2016-12-03 DIAGNOSIS — Z7982 Long term (current) use of aspirin: Secondary | ICD-10-CM | POA: Insufficient documentation

## 2016-12-03 DIAGNOSIS — J449 Chronic obstructive pulmonary disease, unspecified: Secondary | ICD-10-CM | POA: Diagnosis not present

## 2016-12-03 DIAGNOSIS — Z91040 Latex allergy status: Secondary | ICD-10-CM | POA: Insufficient documentation

## 2016-12-03 DIAGNOSIS — Z7902 Long term (current) use of antithrombotics/antiplatelets: Secondary | ICD-10-CM | POA: Insufficient documentation

## 2016-12-03 DIAGNOSIS — J189 Pneumonia, unspecified organism: Secondary | ICD-10-CM

## 2016-12-03 DIAGNOSIS — R0602 Shortness of breath: Secondary | ICD-10-CM | POA: Diagnosis not present

## 2016-12-03 DIAGNOSIS — Z79899 Other long term (current) drug therapy: Secondary | ICD-10-CM | POA: Insufficient documentation

## 2016-12-03 DIAGNOSIS — R079 Chest pain, unspecified: Secondary | ICD-10-CM | POA: Diagnosis not present

## 2016-12-03 LAB — BASIC METABOLIC PANEL
ANION GAP: 6 (ref 5–15)
BUN: 6 mg/dL (ref 6–20)
CALCIUM: 8.3 mg/dL — AB (ref 8.9–10.3)
CO2: 30 mmol/L (ref 22–32)
Chloride: 103 mmol/L (ref 101–111)
Creatinine, Ser: 0.67 mg/dL (ref 0.44–1.00)
GFR calc non Af Amer: >60 mL/min (ref >60)
Glucose, Bld: 100 mg/dL — ABNORMAL HIGH (ref 65–99)
POTASSIUM: 3.5 mmol/L (ref 3.5–5.1)
Sodium: 139 mmol/L (ref 135–145)

## 2016-12-03 LAB — CBC
HCT: 38.2 % (ref 36.0–46.0)
HEMOGLOBIN: 12.5 g/dL (ref 12.0–15.0)
MCH: 29.8 pg (ref 26.0–34.0)
MCHC: 32.7 g/dL (ref 30.0–36.0)
MCV: 91 fL (ref 78.0–100.0)
Platelets: 251 10*3/uL (ref 150–400)
RBC: 4.2 MIL/uL (ref 3.87–5.11)
RDW: 12.4 % (ref 11.5–15.5)
WBC: 5.3 10*3/uL (ref 4.0–10.5)

## 2016-12-03 LAB — BRAIN NATRIURETIC PEPTIDE: B Natriuretic Peptide: 51.5 pg/mL (ref 0.0–100.0)

## 2016-12-03 LAB — D-DIMER, QUANTITATIVE: D-Dimer, Quant: 0.47 ug/mL-FEU (ref 0.00–0.50)

## 2016-12-03 LAB — TROPONIN I: Troponin I: <0.03 ng/mL (ref <0.03)

## 2016-12-03 MED ORDER — ACETAMINOPHEN 325 MG PO TABS
650.0000 mg | ORAL_TABLET | Freq: Once | ORAL | Status: AC
Start: 2016-12-03 — End: 2016-12-03
  Administered 2016-12-03: 650 mg via ORAL
  Filled 2016-12-03: qty 2

## 2016-12-03 MED ORDER — DOXYCYCLINE HYCLATE 100 MG PO CAPS: 100.0000 mg | ORAL_CAPSULE | Freq: Two times a day (BID) | ORAL | 0 refills | Status: DC

## 2016-12-03 MED FILL — DOXYCYCLINE HYCLATE 100 MG: 100 | 7 days supply | Qty: 14 | Fill #0

## 2016-12-03 NOTE — ED Provider Notes (Signed)
[facility]-EMERGENCY DEPT Provider Note   CSN: [id number] Arrival date & time: 12/03/16  1112     History   Chief Complaint Chief Complaint  Patient presents with  . Shortness of Breath    HPI Autumn Young is a 71 y.o. female.  The history is provided by the patient. No language interpreter was used.  Shortness of Breath    Autumn Young is a 71 y.o. female who presents to the Emergency Department complaining of sob.  She presents for evaluation of shortness of breath ongoing for the last 2 weeks. She reports 2 years of intermittent chest discomfort. Over the last 2 weeks she feels like she is sick with flulike symptoms. She has heaviness in her central chest and radiates to bilateral hands. She also reports 2 weeks of left lower extremity swelling and discomfort. No fevers, vomiting, abdominal pain, diarrhea. Past Medical History:  Diagnosis Date  . GERD (gastroesophageal reflux disease)   . Hypertension   . Stroke [facility])     Patient Active Problem List   Diagnosis Date Noted  . OSA (obstructive sleep apnea) 11/28/2015  . COPD (chronic obstructive pulmonary disease) with chronic bronchitis (HCC) 06/26/2015  . Pulmonary hypertension (HCC) 06/26/2015  . 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 Mild intermittent asthma without complication 11/19/2014  . Mixed hyperlipidemia 11/19/2014  . CVA (cerebral vascular accident) (HCC) 11/18/2014  . Essential hypertension 11/18/2014    Past Surgical History:  Procedure Laterality Date  . TUBAL LIGATION      OB History    No data available       Home Medications    Prior to Admission medications   Medication Sig Start Date End Date Taking? Authorizing Provider  losartan (COZAAR) 25 MG tablet Take 25 mg by mouth daily.   Yes [provider]  albuterol (PROVENTIL HFA) 108 (90 Base) MCG/ACT inhaler Inhale 2 puffs into the lungs every 6 (six) hours as needed. 09/03/15   [provider]  aspirin 81 MG EC  tablet Take 81 mg by mouth daily. 08/27/15   [provider]  budesonide-formoterol (SYMBICORT) 160-4.5 MCG/ACT inhaler Inhale 2 puffs into the lungs 2 (two) times daily. 11/28/15 11/27/16  Storm Frisk, MD  clopidogrel (PLAVIX) 75 MG tablet Take 75 mg by mouth daily. 11/26/15   [provider]  doxycycline (VIBRAMYCIN) 100 MG capsule Take 1 capsule (100 mg total) by mouth 2 (two) times daily. 12/03/16   Tilden Fossa, MD  losartan-hydrochlorothiazide [facility]) 50-12.5 MG tablet Take 1 tablet by mouth daily. 08/22/15 08/21/16  [provider]  pantoprazole (PROTONIX) 40 MG tablet Take 40 mg by mouth daily. 09/27/15   [provider]  Red Yeast Rice Extract (RED YEAST RICE PO) Take 1 tablet by mouth at bedtime.    [provider]    Family History History reviewed. No pertinent family history.  Social History Social History  Substance Use Topics  . Smoking status: Never Smoker  . Smokeless tobacco: Never Used  . Alcohol use No     Allergies   Simvastatin; Lasix [furosemide]; Latex; and Penicillins   Review of Systems Review of Systems  Respiratory: Positive for shortness of breath.   All other systems reviewed and are negative.    Physical Exam Updated Vital Signs BP (!) 187/79 (BP Location: Left Arm)   Pulse (!) 55   Temp 98.6 F (37 C) (Oral)   Resp 17   Ht 5\' 6"  (1.676 m)   Wt 133.4 kg (294  lb)   SpO2 96%   BMI 47.45 kg/m   Physical Exam  Constitutional: She is oriented to person, place, and time. She appears well-developed and well-nourished.  HENT:  Head: Normocephalic and atraumatic.  Cardiovascular: Normal rate and regular rhythm.   Murmur heard. Pulmonary/Chest: Effort normal and breath sounds normal. No respiratory distress.  Abdominal: Soft. There is no tenderness. There is no rebound and no guarding.  Musculoskeletal:  Bilateral lower extremity edema, left greater than right.  Neurological: She is alert and  oriented to person, place, and time.  Skin: Skin is warm and dry.  Psychiatric: She has a normal mood and affect. Her behavior is normal.  Nursing note and vitals reviewed.    ED Treatments / Results  Labs (all labs ordered are listed, but only abnormal results are displayed) Labs Reviewed  BASIC METABOLIC PANEL - Abnormal; Notable for the following:       Result Value   Glucose, Bld 100 (*)    Calcium 8.3 (*)    All other components within normal limits  CBC  TROPONIN I  D-DIMER, QUANTITATIVE (NOT AT [facility])  BRAIN NATRIURETIC PEPTIDE    EKG  EKG Interpretation  Date/Time:  Wednesday December 03 2016 11:18:51 EDT Ventricular Rate:  59 PR Interval:  166 QRS Duration: 76 QT Interval:  432 QTC Calculation: 427 R Axis:   16 Text Interpretation:  Sinus bradycardia with sinus arrhythmia Otherwise normal ECG Confirmed by Tilden Fossa [id number]) on 12/03/2016 11:20:05 AM       Radiology Dg Chest 2 View  Result Date: 12/03/2016 CLINICAL DATA:  One week of bilateral arm pain, chest pain, and shortness of breath. History of COPD, previous CVA, pulmonary hypertension, never smoked. EXAM: CHEST  2 VIEW COMPARISON:  Chest x-ray and chest CT scanning of June 27, 2016 FINDINGS: The lungs are adequately inflated. There is subtle increased density in the right infrahilar region. The central pulmonary vascularity is prominent and there is mild cephalization of the upper lobe pulmonary vascularity. The cardiac silhouette is enlarged. There is no pleural effusion. There is mild tortuosity of the descending thoracic aorta. The bony thorax exhibits no acute abnormality. IMPRESSION: Findings consistent with low-grade CHF. Hazy increased density in the right infrahilar region likely reflects subsegmental atelectasis or early pneumonia. Followup PA and lateral chest X-ray is recommended in 3-4 weeks following trial of antibiotic therapy to ensure resolution and exclude underlying malignancy.  Electronically Signed   By: David  [place] M.D.   On: 12/03/2016 11:49    Procedures Procedures (including critical care time)  Medications Ordered in ED Medications  acetaminophen (TYLENOL) tablet 650 mg (650 mg Oral Given 12/03/16 1422)     Initial Impression / Assessment and Plan / ED Course  I have reviewed the triage vital signs and the nursing notes.  Pertinent labs & imaging results that were available during my care of the patient were reviewed by me and considered in my medical decision making (see chart for details).     atient here for evaluation of shortness of breath and bilateral arm heaviness. She is in no distress in the emergency department. She does have known chronic CHF but is not currently on any diuretics due to intolerances and allergies. Chest x-ray is concerning for mild CHF with possible pneumonia. Given patient's flulike symptoms will treat for potential community acquired pneumonia. Discussed with patient dietary changes and avoiding sodium to help with her fluid status. Also discussed importance of cardiology follow-up for medication management. Outpatient  follow-up and return precautions discussed.  Presentation is not c/w ACS, dissection, PE, sepsis.   Final Clinical Impressions(s) / ED Diagnoses   Final diagnoses:  Community acquired pneumonia of right middle lobe of lung (HCC)  Chronic diastolic congestive heart failure (HCC)    New Prescriptions Discharge Medication List as of 12/03/2016  2:13 PM    START taking these medications   Details  doxycycline (VIBRAMYCIN) 100 MG capsule Take 1 capsule (100 mg total) by mouth 2 (two) times daily., Starting Wed 12/03/2016, Print         Tilden Fossaees, Nickisha Hum, MD 12/04/16 [phone]

## 2016-12-03 NOTE — ED Triage Notes (Signed)
Patient states that she has pain to her bilkateral arms and her chest x 2 days. Reports that she is also SOB - patient is SOB upon arrival to the triage.

## 2017-01-21 DIAGNOSIS — I272 Pulmonary hypertension, unspecified: Secondary | ICD-10-CM | POA: Diagnosis not present

## 2017-01-21 DIAGNOSIS — J449 Chronic obstructive pulmonary disease, unspecified: Secondary | ICD-10-CM | POA: Diagnosis not present

## 2017-01-21 DIAGNOSIS — I10 Essential (primary) hypertension: Secondary | ICD-10-CM | POA: Diagnosis not present

## 2017-05-20 DIAGNOSIS — I5033 Acute on chronic diastolic (congestive) heart failure: Secondary | ICD-10-CM | POA: Diagnosis not present

## 2017-05-20 DIAGNOSIS — I10 Essential (primary) hypertension: Secondary | ICD-10-CM | POA: Diagnosis not present

## 2017-06-23 DIAGNOSIS — E669 Obesity, unspecified: Secondary | ICD-10-CM | POA: Diagnosis not present

## 2017-06-23 DIAGNOSIS — Z6841 Body Mass Index (BMI) 40.0 and over, adult: Secondary | ICD-10-CM | POA: Diagnosis not present

## 2017-07-17 DIAGNOSIS — I110 Hypertensive heart disease with heart failure: Secondary | ICD-10-CM | POA: Diagnosis not present

## 2017-07-17 DIAGNOSIS — I10 Essential (primary) hypertension: Secondary | ICD-10-CM | POA: Diagnosis not present

## 2017-07-17 DIAGNOSIS — I5033 Acute on chronic diastolic (congestive) heart failure: Secondary | ICD-10-CM | POA: Diagnosis not present

## 2017-07-21 DIAGNOSIS — Z6841 Body Mass Index (BMI) 40.0 and over, adult: Secondary | ICD-10-CM | POA: Diagnosis not present

## 2017-07-21 DIAGNOSIS — E669 Obesity, unspecified: Secondary | ICD-10-CM | POA: Diagnosis not present

## 2017-08-04 DIAGNOSIS — G4733 Obstructive sleep apnea (adult) (pediatric): Secondary | ICD-10-CM | POA: Diagnosis not present

## 2017-08-10 ENCOUNTER — Other Ambulatory Visit: Payer: Self-pay

## 2017-08-10 ENCOUNTER — Encounter (HOSPITAL_BASED_OUTPATIENT_CLINIC_OR_DEPARTMENT_OTHER): Payer: Self-pay | Admitting: Emergency Medicine

## 2017-08-10 ENCOUNTER — Emergency Department (HOSPITAL_BASED_OUTPATIENT_CLINIC_OR_DEPARTMENT_OTHER): Payer: PPO

## 2017-08-10 ENCOUNTER — Observation Stay (HOSPITAL_BASED_OUTPATIENT_CLINIC_OR_DEPARTMENT_OTHER)
Admission: EM | Admit: 2017-08-10 | Discharge: 2017-08-13 | Disposition: A | Discharge status: Home / Self Care | Payer: PPO | Attending: Internal Medicine | Admitting: Internal Medicine

## 2017-08-10 DIAGNOSIS — Z888 Allergy status to other drugs, medicaments and biological substances status: Secondary | ICD-10-CM | POA: Diagnosis not present

## 2017-08-10 DIAGNOSIS — I110 Hypertensive heart disease with heart failure: Secondary | ICD-10-CM | POA: Diagnosis not present

## 2017-08-10 DIAGNOSIS — J441 Chronic obstructive pulmonary disease with (acute) exacerbation: Secondary | ICD-10-CM | POA: Diagnosis not present

## 2017-08-10 DIAGNOSIS — M7989 Other specified soft tissue disorders: Secondary | ICD-10-CM | POA: Diagnosis not present

## 2017-08-10 DIAGNOSIS — G4733 Obstructive sleep apnea (adult) (pediatric): Secondary | ICD-10-CM | POA: Diagnosis not present

## 2017-08-10 DIAGNOSIS — R0602 Shortness of breath: Secondary | ICD-10-CM | POA: Diagnosis not present

## 2017-08-10 DIAGNOSIS — Z880 Allergy status to penicillin: Secondary | ICD-10-CM | POA: Diagnosis not present

## 2017-08-10 DIAGNOSIS — I272 Pulmonary hypertension, unspecified: Secondary | ICD-10-CM | POA: Diagnosis present

## 2017-08-10 DIAGNOSIS — R531 Weakness: Secondary | ICD-10-CM | POA: Diagnosis not present

## 2017-08-10 DIAGNOSIS — Z87891 Personal history of nicotine dependence: Secondary | ICD-10-CM | POA: Diagnosis not present

## 2017-08-10 DIAGNOSIS — I5032 Chronic diastolic (congestive) heart failure: Secondary | ICD-10-CM | POA: Insufficient documentation

## 2017-08-10 DIAGNOSIS — Z79899 Other long term (current) drug therapy: Secondary | ICD-10-CM | POA: Diagnosis not present

## 2017-08-10 DIAGNOSIS — I10 Essential (primary) hypertension: Secondary | ICD-10-CM | POA: Diagnosis present

## 2017-08-10 DIAGNOSIS — K219 Gastro-esophageal reflux disease without esophagitis: Secondary | ICD-10-CM | POA: Insufficient documentation

## 2017-08-10 DIAGNOSIS — I700 Atherosclerosis of aorta: Secondary | ICD-10-CM | POA: Diagnosis not present

## 2017-08-10 DIAGNOSIS — Z7902 Long term (current) use of antithrombotics/antiplatelets: Secondary | ICD-10-CM | POA: Insufficient documentation

## 2017-08-10 DIAGNOSIS — Z6841 Body Mass Index (BMI) 40.0 and over, adult: Secondary | ICD-10-CM | POA: Insufficient documentation

## 2017-08-10 DIAGNOSIS — Z8673 Personal history of transient ischemic attack (TIA), and cerebral infarction without residual deficits: Secondary | ICD-10-CM | POA: Insufficient documentation

## 2017-08-10 HISTORY — DX: Unspecified asthma, uncomplicated: J45.909

## 2017-08-10 HISTORY — DX: Chronic obstructive pulmonary disease, unspecified: J44.9

## 2017-08-10 LAB — CBC WITH DIFFERENTIAL/PLATELET
Basophils Absolute: 0 10*3/uL (ref 0.0–0.1)
Basophils Relative: 0 %
Eosinophils Absolute: 0.3 10*3/uL (ref 0.0–0.7)
Eosinophils Relative: 4 %
HEMATOCRIT: 36.7 % (ref 36.0–46.0)
HEMOGLOBIN: 12.4 g/dL (ref 12.0–15.0)
LYMPHS PCT: 33 %
Lymphs Abs: 2.4 10*3/uL (ref 0.7–4.0)
MCH: 31.3 pg (ref 26.0–34.0)
MCHC: 33.8 g/dL (ref 30.0–36.0)
MCV: 92.7 fL (ref 78.0–100.0)
MONO ABS: 0.6 10*3/uL (ref 0.1–1.0)
MONOS PCT: 9 %
NEUTROS ABS: 3.8 10*3/uL (ref 1.7–7.7)
NEUTROS PCT: 54 %
Platelets: 282 10*3/uL (ref 150–400)
RBC: 3.96 MIL/uL (ref 3.87–5.11)
RDW: 12.7 % (ref 11.5–15.5)
WBC: 7.1 10*3/uL (ref 4.0–10.5)

## 2017-08-10 LAB — COMPREHENSIVE METABOLIC PANEL
ALBUMIN: 3.8 g/dL (ref 3.5–5.0)
ALK PHOS: 55 U/L (ref 38–126)
ALT: 41 U/L (ref 14–54)
ANION GAP: 5 (ref 5–15)
AST: 50 U/L — ABNORMAL HIGH (ref 15–41)
BILIRUBIN TOTAL: 0.6 mg/dL (ref 0.3–1.2)
BUN: 13 mg/dL (ref 6–20)
CALCIUM: 8.7 mg/dL — AB (ref 8.9–10.3)
CO2: 25 mmol/L (ref 22–32)
Chloride: 104 mmol/L (ref 101–111)
Creatinine, Ser: 0.82 mg/dL (ref 0.44–1.00)
GLUCOSE: 101 mg/dL — AB (ref 65–99)
Potassium: 3.8 mmol/L (ref 3.5–5.1)
Sodium: 134 mmol/L — ABNORMAL LOW (ref 135–145)
Total Protein: 7.6 g/dL (ref 6.5–8.1)

## 2017-08-10 LAB — BRAIN NATRIURETIC PEPTIDE: B Natriuretic Peptide: 44.6 pg/mL (ref 0.0–100.0)

## 2017-08-10 LAB — TROPONIN I

## 2017-08-10 MED ORDER — IPRATROPIUM-ALBUTEROL 0.5-2.5 (3) MG/3ML IN SOLN
3.0000 mL | Freq: Once | RESPIRATORY_TRACT | Status: AC
Start: 2017-08-10 — End: 2017-08-10
  Administered 2017-08-10: 3 mL via RESPIRATORY_TRACT
  Filled 2017-08-10: qty 3

## 2017-08-10 MED ORDER — IOPAMIDOL (ISOVUE-370) INJECTION 76%
100.0000 mL | Freq: Once | INTRAVENOUS | Status: AC | PRN
  Administered 2017-08-10: 100 mL via INTRAVENOUS

## 2017-08-10 MED ORDER — IPRATROPIUM-ALBUTEROL 0.5-2.5 (3) MG/3ML IN SOLN
3.0000 mL | Freq: Once | RESPIRATORY_TRACT | Status: AC
  Administered 2017-08-10: 3 mL via RESPIRATORY_TRACT
  Filled 2017-08-10: qty 3

## 2017-08-10 MED ORDER — METHYLPREDNISOLONE SODIUM SUCC 125 MG IJ SOLR
125.0000 mg | Freq: Once | INTRAMUSCULAR | Status: AC
  Administered 2017-08-10: 125 mg via INTRAVENOUS
  Filled 2017-08-10: qty 2

## 2017-08-10 NOTE — ED Notes (Signed)
ED Provider at bedside. 

## 2017-08-10 NOTE — Plan of Care (Signed)
Hospital Accept Note:  Autumn Young is a 72 y.o. female with h/o COPD, OSA, CVA/TIA and HTN accepted from [facility] with suspected COPD exacerbation. Initial workup for PE given recent travel to [place] and lower extremity edema showed negative CTA PE study. She was given nebs and steroids prior to transfer.  Shari Heritage [name] 08/10/17 8:32 PM

## 2017-08-10 NOTE — ED Notes (Signed)
Patient transported to X-ray 

## 2017-08-10 NOTE — ED Triage Notes (Signed)
Pt presents with SOB x 1 week, using inhaler with no relief. Denies chest pain. RT at triage. Reports she has to sleep sitting up at night. A/O at triage.

## 2017-08-10 NOTE — ED Provider Notes (Signed)
[facility] EMERGENCY DEPARTMENT Provider Note   CSN: [id number] Arrival date & time: 08/10/17  1734     History   Chief Complaint Chief Complaint  Patient presents with  . Shortness of Breath    HPI Autumn Young is a 72 y.o. female.  HPI 72 year old female with past medical history as below here with shortness of breath.  The patient states that she recently traveled to [place].  Upon returning last week, she has developed progressive worsening shortness of breath.  It was initially only with exertion, but has since then become at rest as well.  It seems to worsen when she lays flat.  She is also had significant wheezing that temporarily response to her breathing treatments.  She has not had any chest pain.  She has noted swelling in her bilateral legs as well.  Denies any dietary indiscretions while traveling.  Denies any history of blood clots.  Symptoms are worse with any kind of movement.  Denies any alleviating factors.  No fevers or chills.  No sputum production.  Past Medical History:  Diagnosis Date  . Asthma   . GERD (gastroesophageal reflux disease)   . Hypertension   . Stroke [facility])     Patient Active Problem List   Diagnosis Date Noted  . COPD exacerbation (HCC) 08/10/2017  . OSA (obstructive sleep apnea) 11/28/2015  . COPD (chronic obstructive pulmonary disease) with chronic bronchitis (HCC) 06/26/2015  . Pulmonary hypertension (HCC) 06/26/2015  . 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 Mild intermittent asthma without complication 11/19/2014  . Mixed hyperlipidemia 11/19/2014  . CVA (cerebral vascular accident) (HCC) 11/18/2014  . Essential hypertension 11/18/2014    Past Surgical History:  Procedure Laterality Date  . TUBAL LIGATION       OB History   None      Home Medications    Prior to Admission medications   Medication Sig Start Date End Date Taking? Authorizing Provider  albuterol (PROVENTIL HFA) 108 (90 Base) MCG/ACT  inhaler Inhale 2 puffs into the lungs every 6 (six) hours as needed. 09/03/15   [provider]  aspirin 81 MG EC tablet Take 81 mg by mouth daily. 08/27/15   [provider]  budesonide-formoterol (SYMBICORT) 160-4.5 MCG/ACT inhaler Inhale 2 puffs into the lungs 2 (two) times daily. 11/28/15 11/27/16  Storm Frisk, MD  clopidogrel (PLAVIX) 75 MG tablet Take 75 mg by mouth daily. 11/26/15   [provider]  doxycycline (VIBRAMYCIN) 100 MG capsule Take 1 capsule (100 mg total) by mouth 2 (two) times daily. 12/03/16   Tilden Fossa, MD  losartan (COZAAR) 25 MG tablet Take 25 mg by mouth daily.    [provider]  losartan-hydrochlorothiazide (HYZAAR) 50-12.5 MG tablet Take 1 tablet by mouth daily. 08/22/15 08/21/16  [provider]  pantoprazole (PROTONIX) 40 MG tablet Take 40 mg by mouth daily. 09/27/15   [provider]  Red Yeast Rice Extract (RED YEAST RICE PO) Take 1 tablet by mouth at bedtime.    [provider]    Family History No family history on file.  Social History Social History   Tobacco Use  . Smoking status: Never Smoker  . Smokeless tobacco: Never Used  Substance Use Topics  . Alcohol use: No  . Drug use: No     Allergies   Simvastatin; Lasix [furosemide]; Latex; and Penicillins   Review of Systems Review of Systems  Constitutional: Positive for fatigue.  Respiratory: Positive for cough, shortness  of breath and wheezing.   Neurological: Positive for weakness.  All other systems reviewed and are negative.    Physical Exam Updated Vital Signs BP (!) 166/83   Pulse 71   Temp 98.1 F (36.7 C) (Oral)   Resp 17   Ht  (1.676 m)   Wt 128.4 kg (283 lb)   SpO2 90%   BMI 45.68 kg/m   Physical Exam  Constitutional: She is oriented to person, place, and time. She appears well-developed and well-nourished. No distress.  HENT:  Head: Normocephalic and atraumatic.  Eyes: Conjunctivae are normal.    Neck: Neck supple.  Cardiovascular: Normal rate, regular rhythm and normal heart sounds. Exam reveals no friction rub.  No murmur heard. Pulmonary/Chest: Effort normal. No respiratory distress. She has decreased breath sounds. She has no wheezes. She has no rales.  Abdominal: She exhibits no distension.  Musculoskeletal:       Right lower leg: She exhibits edema.       Left lower leg: She exhibits edema.  Neurological: She is alert and oriented to person, place, and time. She exhibits normal muscle tone.  Skin: Skin is warm. Capillary refill takes less than 2 seconds.  Psychiatric: She has a normal mood and affect.  Nursing note and vitals reviewed.    ED Treatments / Results  Labs (all labs ordered are listed, but only abnormal results are displayed) Labs Reviewed  COMPREHENSIVE METABOLIC PANEL - Abnormal; Notable for the following components:      Result Value   Sodium 134 (*)    Glucose, Bld 101 (*)    Calcium 8.7 (*)    AST 50 (*)    All other components within normal limits  CBC WITH DIFFERENTIAL/PLATELET  TROPONIN I  BRAIN NATRIURETIC PEPTIDE    EKG EKG Interpretation  Date/Time:  Monday Aug 10 2017 18:01:19 EDT Ventricular Rate:  64 PR Interval:    QRS Duration: 86 QT Interval:  432 QTC Calculation: 446 R Axis:   28 Text Interpretation:  Sinus rhythm Atrial premature complex Low voltage, precordial leads No significant change since last tracing Confirmed by Shaune Pollack [phone]) on 08/10/2017 6:03:51 PM   Radiology Dg Chest 2 View  Result Date: 08/10/2017 CLINICAL DATA:  Pt c/o sob x 1 week and swelling in lower extremities x 1 day. Hx of htn and stroke. EXAM: CHEST - 2 VIEW COMPARISON:  Chest x-ray dated 12/03/2016. FINDINGS: Stable cardiomegaly. Overall cardiomediastinal silhouette is stable given the slightly oblique patient positioning. Lungs are clear. No pleural effusion or pneumothorax seen. No acute or suspicious osseous finding. IMPRESSION: No active  cardiopulmonary disease. No evidence of pneumonia or pulmonary edema. Stable cardiomegaly. Electronically Signed   By: Bary Richard M.D.   On: 08/10/2017 18:29   Ct Angio Chest Pe W And/or Wo Contrast  Result Date: 08/10/2017 CLINICAL DATA:  Shortness of breath and bilateral lower extremity swelling EXAM: CT ANGIOGRAPHY CHEST WITH CONTRAST TECHNIQUE: Multidetector CT imaging of the chest was performed using the standard protocol during bolus administration of intravenous contrast. Multiplanar CT image reconstructions and MIPs were obtained to evaluate the vascular anatomy. CONTRAST:  ISOVUE-370 IOPAMIDOL (ISOVUE-370) INJECTION 76% COMPARISON:  CTA chest 06/27/2016 FINDINGS: Cardiovascular: --Pulmonary arteries: Satisfactory contrast bolus. Beam attenuation caused by patient body habitus limits assessment beyond the proximal segmental level.There is no pulmonary embolus. The main pulmonary artery is within normal limits for size. --Aorta: Limited opacification of the aorta due to bolus timing optimization for the pulmonary arteries. Conventional  3 vessel aortic branching pattern. The aortic course and caliber are normal. There is mildaortic atherosclerosis. --Heart: Normal size. No pericardial effusion. Mediastinum/Nodes: Small hiatal hernia. No mediastinal adenopathy. No axillary adenopathy. The visualized thyroid gland is normal. Lungs/Pleura: No pulmonary nodules or masses. No pleural effusion or pneumothorax. No focal airspace consolidation. No focal pleural abnormality. Upper Abdomen: Contrast bolus timing is not optimized for evaluation of the abdominal organs. Within this limitation, the visualized organs of the upper abdomen are normal. Musculoskeletal: No chest wall abnormality. No acute or significant osseous findings. Review of the MIP images confirms the above findings. IMPRESSION: 1. Beam attenuation caused by patient body habitus. Image quality, limiting assessment beyond the proximal  segmental pulmonary arteries. Within that limitation, there is no proximal pulmonary embolus. 2. No other acute thoracic abnormality. 3. Small hiatal hernia. 4. Mild aortic atherosclerosis (ICD10-I70.0). Electronically Signed   By: Deatra Robinson M.D.   On: 08/10/2017 19:15    Procedures Procedures (including critical care time)  Medications Ordered in ED Medications  ipratropium-albuterol (DUONEB) 0.5-2.5 (3) MG/3ML nebulizer solution 3 mL (3 mLs Nebulization Given 08/10/17 1838)  iopamidol (ISOVUE-370) 76 % injection 100 mL (100 mLs Intravenous Contrast Given 08/10/17 1851)  methylPREDNISolone sodium succinate (SOLU-MEDROL) 125 mg/2 mL injection 125 mg (125 mg Intravenous Given 08/10/17 2009)  ipratropium-albuterol (DUONEB) 0.5-2.5 (3) MG/3ML nebulizer solution 3 mL (3 mLs Nebulization Given 08/10/17 2018)     Initial Impression / Assessment and Plan / ED Course  I have reviewed the triage vital signs and the nursing notes.  Pertinent labs & imaging results that were available during my care of the patient were reviewed by me and considered in my medical decision making (see chart for details).     72 yo F with h/o COPD, obesity, here with cough and SOB. On exam, pt tachypneic, with diminished breath sounds bilaterally. She also has b/l pitting edema. DDx broad. Lab work reassuring. CXR clear. Suspect COPD exacerbation, possibly component of pulmonary edema, also must consider PE given her recent prolonged travel.  CT Angio neg. Pt with persistent wheezing, mild hypoxia upon ambualting in the ED. No CP, EKG is non-ischemic. Suspect COPD exacerbation. Will treat with nebs, steroids, and admit. She is also hypervolemic but has allergy to lasix, no pulm edema on CXR - will defer to hospitalist.  Final Clinical Impressions(s) / ED Diagnoses   Final diagnoses:  COPD exacerbation [facility])    ED Discharge Orders    None       Shaune Pollack, MD 08/11/17 [phone]

## 2017-08-10 NOTE — ED Notes (Signed)
Ambulated patient per physician order. Patient resting oxygen saturation was 91% with heart rate of 65. Patient had to stop walking due to increased WOB and tachypnea. Patient returned to room and placed back on monitor. Patient oxygen saturations were 89% upon returning to room.

## 2017-08-11 ENCOUNTER — Other Ambulatory Visit: Payer: Self-pay

## 2017-08-11 ENCOUNTER — Encounter (HOSPITAL_BASED_OUTPATIENT_CLINIC_OR_DEPARTMENT_OTHER): Payer: Self-pay | Admitting: Emergency Medicine

## 2017-08-11 DIAGNOSIS — I272 Pulmonary hypertension, unspecified: Secondary | ICD-10-CM | POA: Diagnosis not present

## 2017-08-11 DIAGNOSIS — I5032 Chronic diastolic (congestive) heart failure: Secondary | ICD-10-CM | POA: Diagnosis not present

## 2017-08-11 DIAGNOSIS — G4733 Obstructive sleep apnea (adult) (pediatric): Secondary | ICD-10-CM | POA: Diagnosis not present

## 2017-08-11 DIAGNOSIS — Z880 Allergy status to penicillin: Secondary | ICD-10-CM | POA: Diagnosis not present

## 2017-08-11 DIAGNOSIS — Z7902 Long term (current) use of antithrombotics/antiplatelets: Secondary | ICD-10-CM | POA: Diagnosis not present

## 2017-08-11 DIAGNOSIS — I110 Hypertensive heart disease with heart failure: Secondary | ICD-10-CM | POA: Diagnosis not present

## 2017-08-11 DIAGNOSIS — I700 Atherosclerosis of aorta: Secondary | ICD-10-CM | POA: Diagnosis not present

## 2017-08-11 DIAGNOSIS — I10 Essential (primary) hypertension: Secondary | ICD-10-CM | POA: Diagnosis not present

## 2017-08-11 DIAGNOSIS — Z79899 Other long term (current) drug therapy: Secondary | ICD-10-CM | POA: Diagnosis not present

## 2017-08-11 DIAGNOSIS — Z888 Allergy status to other drugs, medicaments and biological substances status: Secondary | ICD-10-CM | POA: Diagnosis not present

## 2017-08-11 DIAGNOSIS — Z8673 Personal history of transient ischemic attack (TIA), and cerebral infarction without residual deficits: Secondary | ICD-10-CM | POA: Diagnosis not present

## 2017-08-11 DIAGNOSIS — K219 Gastro-esophageal reflux disease without esophagitis: Secondary | ICD-10-CM | POA: Diagnosis not present

## 2017-08-11 DIAGNOSIS — Z6841 Body Mass Index (BMI) 40.0 and over, adult: Secondary | ICD-10-CM | POA: Diagnosis not present

## 2017-08-11 DIAGNOSIS — J441 Chronic obstructive pulmonary disease with (acute) exacerbation: Secondary | ICD-10-CM | POA: Diagnosis not present

## 2017-08-11 DIAGNOSIS — Z87891 Personal history of nicotine dependence: Secondary | ICD-10-CM | POA: Diagnosis not present

## 2017-08-11 MED ORDER — PREDNISONE 20 MG PO TABS
40.0000 mg | ORAL_TABLET | Freq: Every day | ORAL | Status: DC
  Administered 2017-08-11 – 2017-08-13 (×3): 40 mg via ORAL
  Filled 2017-08-11 (×3): qty 2

## 2017-08-11 MED ORDER — MOMETASONE FURO-FORMOTEROL FUM 200-5 MCG/ACT IN AERO
2.0000 | INHALATION_SPRAY | Freq: Two times a day (BID) | RESPIRATORY_TRACT | Status: DC
  Administered 2017-08-11 – 2017-08-13 (×5): 2 via RESPIRATORY_TRACT
  Filled 2017-08-11: qty 8.8

## 2017-08-11 MED ORDER — ONDANSETRON HCL 4 MG PO TABS: 4.0000 mg | ORAL_TABLET | Freq: Four times a day (QID) | ORAL | Status: DC | PRN

## 2017-08-11 MED ORDER — TIOTROPIUM BROMIDE MONOHYDRATE 18 MCG IN CAPS
18.0000 ug | ORAL_CAPSULE | Freq: Every day | RESPIRATORY_TRACT | Status: DC
  Administered 2017-08-11 – 2017-08-13 (×3): 18 ug via RESPIRATORY_TRACT
  Filled 2017-08-11: qty 5

## 2017-08-11 MED ORDER — CLOPIDOGREL BISULFATE 75 MG PO TABS
75.0000 mg | ORAL_TABLET | Freq: Every day | ORAL | Status: DC
  Administered 2017-08-11 – 2017-08-13 (×3): 75 mg via ORAL
  Filled 2017-08-11 (×3): qty 1

## 2017-08-11 MED ORDER — ENOXAPARIN SODIUM 80 MG/0.8ML ~~LOC~~ SOLN
70.0000 mg | Freq: Every day | SUBCUTANEOUS | Status: DC
  Administered 2017-08-11 – 2017-08-13 (×3): 70 mg via SUBCUTANEOUS
  Filled 2017-08-11 (×3): qty 0.8

## 2017-08-11 MED ORDER — LOSARTAN POTASSIUM 50 MG PO TABS
50.0000 mg | ORAL_TABLET | Freq: Every day | ORAL | Status: DC
  Administered 2017-08-11 – 2017-08-13 (×3): 50 mg via ORAL
  Filled 2017-08-11 (×3): qty 1

## 2017-08-11 MED ORDER — IPRATROPIUM-ALBUTEROL 0.5-2.5 (3) MG/3ML IN SOLN
3.0000 mL | Freq: Once | RESPIRATORY_TRACT | Status: AC
  Administered 2017-08-11: 3 mL via RESPIRATORY_TRACT
  Filled 2017-08-11: qty 3

## 2017-08-11 MED ORDER — AZITHROMYCIN 250 MG PO TABS
250.0000 mg | ORAL_TABLET | Freq: Every day | ORAL | Status: DC
  Administered 2017-08-12 – 2017-08-13 (×2): 250 mg via ORAL
  Filled 2017-08-11 (×2): qty 1

## 2017-08-11 MED ORDER — ALBUTEROL SULFATE (2.5 MG/3ML) 0.083% IN NEBU: 2.5000 mg | INHALATION_SOLUTION | RESPIRATORY_TRACT | Status: DC | PRN

## 2017-08-11 MED ORDER — AZITHROMYCIN 250 MG PO TABS
500.0000 mg | ORAL_TABLET | Freq: Every day | ORAL | Status: AC
  Administered 2017-08-11: 500 mg via ORAL
  Filled 2017-08-11: qty 2

## 2017-08-11 MED ORDER — CHLORHEXIDINE GLUCONATE 0.12 % MT SOLN
15.0000 mL | Freq: Two times a day (BID) | OROMUCOSAL | Status: DC
  Administered 2017-08-11 – 2017-08-13 (×5): 15 mL via OROMUCOSAL
  Filled 2017-08-11 (×3): qty 15

## 2017-08-11 MED ORDER — PANTOPRAZOLE SODIUM 40 MG PO TBEC
40.0000 mg | DELAYED_RELEASE_TABLET | Freq: Two times a day (BID) | ORAL | Status: DC
  Administered 2017-08-11 – 2017-08-13 (×5): 40 mg via ORAL
  Filled 2017-08-11 (×5): qty 1

## 2017-08-11 MED ORDER — ONDANSETRON HCL 4 MG/2ML IJ SOLN: 4.0000 mg | Freq: Four times a day (QID) | INTRAMUSCULAR | Status: DC | PRN

## 2017-08-11 MED ORDER — CHLORTHALIDONE 25 MG PO TABS
25.0000 mg | ORAL_TABLET | Freq: Every day | ORAL | Status: DC
  Administered 2017-08-11 – 2017-08-13 (×3): 25 mg via ORAL
  Filled 2017-08-11 (×3): qty 1

## 2017-08-11 MED ORDER — ORAL CARE MOUTH RINSE
15.0000 mL | Freq: Two times a day (BID) | OROMUCOSAL | Status: DC
  Administered 2017-08-12 (×2): 15 mL via OROMUCOSAL

## 2017-08-11 MED ORDER — ACETAMINOPHEN 325 MG PO TABS: 650.0000 mg | ORAL_TABLET | Freq: Four times a day (QID) | ORAL | Status: DC | PRN

## 2017-08-11 MED ORDER — ALBUTEROL SULFATE (2.5 MG/3ML) 0.083% IN NEBU
2.5000 mg | INHALATION_SOLUTION | Freq: Three times a day (TID) | RESPIRATORY_TRACT | Status: DC
  Filled 2017-08-11: qty 3

## 2017-08-11 MED ORDER — ACETAMINOPHEN 650 MG RE SUPP: 650.0000 mg | Freq: Four times a day (QID) | RECTAL | Status: DC | PRN

## 2017-08-11 NOTE — H&P (Signed)
History and Physical    Autumn Young ZOX:[id number] DOB: 01/08/1946 DOA: 08/10/2017  PCP: Patient, No Pcp Per  Patient coming from: Home  I have personally briefly reviewed patient's old medical records in [facility]  Chief Complaint: SOB  HPI: Autumn Young is a 72 y.o. female with medical history significant of COPD, OSA, HTN, stroke, mild PAH.  Patient presents to the ED at [facility] with c/o SOB.  The patient states that she recently traveled to [place].  Upon returning last week, she has developed progressive worsening shortness of breath.  It was initially only with exertion, but has since then become at rest as well.  It seems to worsen when she lays flat.  She is also had significant wheezing that temporarily response to her breathing treatments.  She has not had any chest pain.  She has noted swelling in her bilateral legs as well.  Denies any dietary indiscretions while traveling.  Denies any history of blood clots.  Symptoms are worse with any kind of movement.  Denies any alleviating factors.  No fevers or chills.  No sputum production.   ED Course: CTA neg for PE.  BNP only 40.  No pulm edema noted on imaging.  Poor air movement initially, improved after steroids and neb treatments.  Patient admitted to hospital for presumed COPD exacerbation.   Review of Systems: As per HPI otherwise 10 point review of systems negative.   Past Medical History:  Diagnosis Date  . Asthma   . COPD (chronic obstructive pulmonary disease) (HCC)   . GERD (gastroesophageal reflux disease)   . Hypertension   . Stroke [facility])     Past Surgical History:  Procedure Laterality Date  . TUBAL LIGATION       reports that she has never smoked. She has never used smokeless tobacco. She reports that she does not drink alcohol or use drugs.  Allergies  Allergen Reactions  . Simvastatin Swelling  . Lasix [Furosemide] Swelling  . Latex     rash  . Penicillins Rash    Has patient had a PCN  reaction causing immediate rash, facial/tongue/throat swelling, SOB or lightheadedness with hypotension: Unknown Has patient had a PCN reaction causing severe rash involving mucus membranes or skin necrosis: No Has patient had a PCN reaction that required hospitalization: No Has patient had a PCN reaction occurring within the last 10 years: No If all of the above answers are "NO", then may proceed with Cephalosporin use.     Family History  Problem Relation Age of Onset  . Cancer Mother   . Hypertension Father   . Cancer Sister   . Diabetes Sister   . Hypertension Sister   . Heart disease Sister   . Cancer Brother   . Hypertension Brother      Prior to Admission medications   Medication Sig Start Date End Date Taking? Authorizing Provider  albuterol (PROVENTIL HFA) 108 (90 Base) MCG/ACT inhaler Inhale 2 puffs into the lungs every 6 (six) hours as needed for wheezing.  09/03/15  Yes [provider]  budesonide-formoterol (SYMBICORT) 160-4.5 MCG/ACT inhaler Inhale 2 puffs into the lungs 2 (two) times daily. 11/28/15 08/11/17 Yes Storm Frisk, MD  chlorthalidone (HYGROTON) 25 MG tablet Take 25 mg by mouth daily.   Yes [provider]  clopidogrel (PLAVIX) 75 MG tablet Take 75 mg by mouth daily. 11/26/15  Yes [provider]  losartan (COZAAR) 50 MG tablet Take 50 mg by mouth daily.  Yes [provider]  pantoprazole (PROTONIX) 40 MG tablet Take 40 mg by mouth 2 (two) times daily.  09/27/15  Yes [provider]    Physical Exam: Vitals:   08/11/17 0053 08/11/17 0100 08/11/17 0145 08/11/17 0150  BP:  (!) 168/98 (!) 178/96 (!) 182/86  Pulse:  72  77  Resp:  (!) 22  (!) 22  Temp:    98.3 F (36.8 C)  TempSrc:    Oral  SpO2: 92% 94%  97%  Weight:    (!) 140.1 kg (308 lb 13.8 oz)  Height:     (1.676 m)    Constitutional: NAD, calm, comfortable Eyes: PERRL, lids and conjunctivae normal ENMT: Mucous membranes are moist. Posterior  pharynx clear of any exudate or lesions.Normal dentition.  Neck: normal, supple, no masses, no thyromegaly Respiratory: Prolonged expiratory phase, few wheezes Cardiovascular: Regular rate and rhythm, no murmurs / rubs / gallops. BLE 2+ edema. 2+ pedal pulses. No carotid bruits.  Abdomen: no tenderness, no masses palpated. No hepatosplenomegaly. Bowel sounds positive.  Musculoskeletal: no clubbing / cyanosis. No joint deformity upper and lower extremities. Good ROM, no contractures. Normal muscle tone.  Skin: no rashes, lesions, ulcers. No induration Neurologic: CN 2-12 grossly intact. Sensation intact, DTR normal. Strength 5/5 in all 4.  Psychiatric: Normal judgment and insight. Alert and oriented x 3. Normal mood.    Labs on Admission: I have personally reviewed following labs and imaging studies  CBC: Recent Labs  Lab 08/10/17 1751  WBC 7.1  NEUTROABS 3.8  HGB 12.4  HCT 36.7  MCV 92.7  PLT 282   Basic Metabolic Panel: Recent Labs  Lab 08/10/17 1751  NA 134*  K 3.8  CL 104  CO2 25  GLUCOSE 101*  BUN 13  CREATININE 0.82  CALCIUM 8.7*   GFR: Estimated Creatinine Clearance: 88.4 mL/min (by C-G formula based on SCr of 0.82 mg/dL). Liver Function Tests: Recent Labs  Lab 08/10/17 1751  AST 50*  ALT 41  ALKPHOS 55  BILITOT 0.6  PROT 7.6  ALBUMIN 3.8   No results for input(s): LIPASE, AMYLASE in the last 168 hours. No results for input(s): AMMONIA in the last 168 hours. Coagulation Profile: No results for input(s): INR, PROTIME in the last 168 hours. Cardiac Enzymes: Recent Labs  Lab 08/10/17 1751  TROPONINI <0.03   BNP (last 3 results) No results for input(s): PROBNP in the last 8760 hours. HbA1C: No results for input(s): HGBA1C in the last 72 hours. CBG: No results for input(s): GLUCAP in the last 168 hours. Lipid Profile: No results for input(s): CHOL, HDL, LDLCALC, TRIG, CHOLHDL, LDLDIRECT in the last 72 hours. Thyroid Function Tests: No results for  input(s): TSH, T4TOTAL, FREET4, T3FREE, THYROIDAB in the last 72 hours. Anemia Panel: No results for input(s): VITAMINB12, FOLATE, FERRITIN, TIBC, IRON, RETICCTPCT in the last 72 hours. Urine analysis: No results found for: COLORURINE, APPEARANCEUR, LABSPEC, PHURINE, GLUCOSEU, HGBUR, BILIRUBINUR, KETONESUR, PROTEINUR, UROBILINOGEN, NITRITE, LEUKOCYTESUR  Radiological Exams on Admission: Dg Chest 2 View  Result Date: 08/10/2017 CLINICAL DATA:  Pt c/o sob x 1 week and swelling in lower extremities x 1 day. Hx of htn and stroke. EXAM: CHEST - 2 VIEW COMPARISON:  Chest x-ray dated 12/03/2016. FINDINGS: Stable cardiomegaly. Overall cardiomediastinal silhouette is stable given the slightly oblique patient positioning. Lungs are clear. No pleural effusion or pneumothorax seen. No acute or suspicious osseous finding. IMPRESSION: No active cardiopulmonary disease. No evidence of pneumonia or pulmonary edema. Stable cardiomegaly.  Electronically Signed   By: Bary Richard M.D.   On: 08/10/2017 18:29   Ct Angio Chest Pe W And/or Wo Contrast  Result Date: 08/10/2017 CLINICAL DATA:  Shortness of breath and bilateral lower extremity swelling EXAM: CT ANGIOGRAPHY CHEST WITH CONTRAST TECHNIQUE: Multidetector CT imaging of the chest was performed using the standard protocol during bolus administration of intravenous contrast. Multiplanar CT image reconstructions and MIPs were obtained to evaluate the vascular anatomy. CONTRAST:  ISOVUE-370 IOPAMIDOL (ISOVUE-370) INJECTION 76% COMPARISON:  CTA chest 06/27/2016 FINDINGS: Cardiovascular: --Pulmonary arteries: Satisfactory contrast bolus. Beam attenuation caused by patient body habitus limits assessment beyond the proximal segmental level.There is no pulmonary embolus. The main pulmonary artery is within normal limits for size. --Aorta: Limited opacification of the aorta due to bolus timing optimization for the pulmonary arteries. Conventional 3 vessel aortic branching  pattern. The aortic course and caliber are normal. There is mildaortic atherosclerosis. --Heart: Normal size. No pericardial effusion. Mediastinum/Nodes: Small hiatal hernia. No mediastinal adenopathy. No axillary adenopathy. The visualized thyroid gland is normal. Lungs/Pleura: No pulmonary nodules or masses. No pleural effusion or pneumothorax. No focal airspace consolidation. No focal pleural abnormality. Upper Abdomen: Contrast bolus timing is not optimized for evaluation of the abdominal organs. Within this limitation, the visualized organs of the upper abdomen are normal. Musculoskeletal: No chest wall abnormality. No acute or significant osseous findings. Review of the MIP images confirms the above findings. IMPRESSION: 1. Beam attenuation caused by patient body habitus. Image quality, limiting assessment beyond the proximal segmental pulmonary arteries. Within that limitation, there is no proximal pulmonary embolus. 2. No other acute thoracic abnormality. 3. Small hiatal hernia. 4. Mild aortic atherosclerosis (ICD10-I70.0). Electronically Signed   By: Deatra Robinson M.D.   On: 08/10/2017 19:15    EKG: Independently reviewed.  Assessment/Plan Principal Problem:   COPD exacerbation (HCC) Active Problems:   Essential hypertension   Pulmonary hypertension (HCC)   OSA (obstructive sleep apnea)    1. COPD exacerbation - 1. COPD pathway 2. Prednisone 3. Dulera 4. Spiriva 5. PRN albuterol 2. PAH - 1. Mild historically on cath in 2017 2. Still seems mild with preserved EF and RV systolic function on echo performed just a couple of weeks ago at [facility] (care everywhere). 3. Will leave on losartan and chlorthalidone 3. HTN - cont home meds as above 4. OSA - cont CPAP  DVT prophylaxis: Lovenox Code Status: Full Family Communication: No family in room Disposition Plan: Home after admit Consults called: None Admission status: Place in obs   GARDNER, Heywood Iles. DO [facility] Pager [phone]  If 7AM-7PM, please contact day team taking care of patient [email or web address] Password [facility]  08/11/2017, 4:48 AM

## 2017-08-11 NOTE — Progress Notes (Signed)
Patient seen and evaluated earlier this a.m. by my associate.  Patient improves improvement of respiratory condition.  I will continue current plan laid out in H&P  General: Patient no acute distress, alert and awake Vascular: No cyanosis, S1-S2 present Pulmonary: No increased work of breathing, equal chest rise  Will reassess next am.  Penny Pia, MD

## 2017-08-11 NOTE — Progress Notes (Signed)
Nutrition Brief Note  RD consulted per COPD protocol.   Wt Readings from Last 15 Encounters:  08/11/17 (!) 306 lb 12.8 oz (139.2 kg)  12/03/16 294 lb (133.4 kg)  11/28/15 (!) 302 lb (137 kg)  11/11/15 298 lb (135.2 kg)  06/20/15 268 lb (121.6 kg)  05/24/15 280 lb (127 kg)   Patient with PMH significant for COPD, OSA, HTN, and stroke. Presents this admission with COPD exacerbation. Pt denies any recent wt loss or loss in appetite, Tolerated eggs, pancakes, and toast this morning. Does not wish to have supplementation. No skin breakdown recorded.   Body mass index is 49.52 kg/m. Patient meets criteria for morbid obesity based on current BMI.   Current diet order is heart healthy, patient is consuming approximately 100% of meals at this time. Labs and medications reviewed.   No nutrition interventions warranted at this time. If nutrition issues arise, please consult RD.   Vanessa Kick RD, LDN Clinical Nutrition Pager # [phone]

## 2017-08-11 NOTE — Progress Notes (Signed)
Rx Brief note: Lovenox  Wt=140 kg, BMI=49, CrCl~88 ml/min  Rx adjusted Lovenox to 70 mg daily for DVT prophylaxis (~0.5 mg/kg) in pt with BMI>30  Thanks Lorenza Evangelist 08/11/2017 4:25 AM

## 2017-08-12 DIAGNOSIS — I5033 Acute on chronic diastolic (congestive) heart failure: Secondary | ICD-10-CM

## 2017-08-12 DIAGNOSIS — I10 Essential (primary) hypertension: Secondary | ICD-10-CM | POA: Diagnosis not present

## 2017-08-12 DIAGNOSIS — G4733 Obstructive sleep apnea (adult) (pediatric): Secondary | ICD-10-CM | POA: Diagnosis not present

## 2017-08-12 DIAGNOSIS — J441 Chronic obstructive pulmonary disease with (acute) exacerbation: Secondary | ICD-10-CM | POA: Diagnosis not present

## 2017-08-12 DIAGNOSIS — I5032 Chronic diastolic (congestive) heart failure: Secondary | ICD-10-CM | POA: Diagnosis not present

## 2017-08-12 MED ORDER — FELODIPINE ER 2.5 MG PO TB24
2.5000 mg | ORAL_TABLET | Freq: Every day | ORAL | Status: DC
  Administered 2017-08-12 – 2017-08-13 (×2): 2.5 mg via ORAL
  Filled 2017-08-12 (×2): qty 1

## 2017-08-12 MED ORDER — HYDRALAZINE HCL 20 MG/ML IJ SOLN
5.0000 mg | INTRAMUSCULAR | Status: DC | PRN
  Administered 2017-08-12 – 2017-08-13 (×2): 5 mg via INTRAVENOUS
  Filled 2017-08-12 (×2): qty 1

## 2017-08-12 MED ORDER — ALBUTEROL SULFATE (2.5 MG/3ML) 0.083% IN NEBU: 2.5000 mg | INHALATION_SOLUTION | RESPIRATORY_TRACT | Status: DC | PRN

## 2017-08-12 NOTE — Progress Notes (Addendum)
PROGRESS NOTE   Autumn Young  ZOX:[id number]    DOB: 1946/02/22    DOA: 08/10/2017  PCP: Patient, No Pcp Per   I have briefly reviewed patients previous medical records in [facility].  Brief Narrative:  72 year old female, lives with family, uses cane at times to ambulate, PMH of asthma/COPD, OSA on CPAP, HTN, stroke, GERD presented to [facility] ED due to progressive lower extremity edema, dyspnea, wheezing, dry cough and weight gain and was exposed to her grandkids with URTI.  Admitted for possible COPD exacerbation.   Assessment & Plan:   Principal Problem:   COPD exacerbation (HCC) Active Problems:   Essential hypertension   Pulmonary hypertension (HCC)   OSA (obstructive sleep apnea)   COPD exacerbation: Remote former smoker.  Treated with oral azithromycin, prednisone, Spiriva, albuterol inhalers and Dulera.  CTA chest: Limited but no PE noted.  Improving.  Dyspnea on exertion is likely multifactorial due to severe OSA, OHS, COPD and chronic diastolic CHF.  OSA/OHS: Continue nightly CPAP.  Essential hypertension: Uncontrolled on current home regimen of losartan 50 mg daily and chlorthalidone 25 mg daily.  Supposed to be on felodipine 10 mg daily based on cardiology follow-up, will add back at reduced dose 2.5 mg daily.    History of stroke: Continue Plavix.  Aspirin discontinued during cardiology follow-up 05/20/2017.  Patient refused statins.  GERD: Continue PPI.  Morbid obesity/Body mass index is 49.52 kg/m.   Chronic diastolic CHF: TTE 07/20/2017: LVEF 65-70%, mild LVH, grade 1 diastolic dysfunction.  Presented with weight of 283 which has gone up to 306 pounds. She seems to have progressively gained weight based on outpatient cardiology follow-up from February.  Her dyspnea has improved despite no IV diuretics.  Lasix listed as allergy.?  Consideration to add a second diuretic.    DVT prophylaxis: Lovenox Code Status: Full Family Communication: None at  bedside Disposition: To home pending clinical improvement, possibly in 1 to 2 days.   Consultants:  None  Procedures:  CPAP  Antimicrobials:  Oral azithromycin   Subjective: Feels much better.  Dyspnea improved but breathing not yet at baseline.  Mild intermittent dry cough.  No chest pain.  No fever or chills.  Extremity edema improving.  States that she had swelling of all her limbs and had difficulty putting on her shoes.  ROS: As above.  Objective:  Vitals:   08/12/17 0547 08/12/17 0759 08/12/17 0907 08/12/17 1438  BP: (!) 166/77  (!) 173/82 (!) 187/73  Pulse: (!) 55  (!) 57 (!) 59  Resp: 20   17  Temp: (!) 97.4 F (36.3 C)   97.6 F (36.4 C)  TempSrc: Oral   Oral  SpO2: 94% 97%  97%  Weight:      Height:        Examination:  General exam: Pleasant elderly female, moderately built and morbidly obese, sitting up comfortably at edge of bed. Respiratory system: Occasional basal crackles but otherwise clear to auscultation. Respiratory effort normal. Cardiovascular system: S1 & S2 heard, RRR. No JVD, murmurs, rubs, gallops or clicks.  1+ pitting bilateral leg edema. Gastrointestinal system: Abdomen is nondistended, soft and nontender. No organomegaly or masses felt. Normal bowel sounds heard. Central nervous system: Alert and oriented. No focal neurological deficits. Extremities: Symmetric 5 x 5 power. Skin: No rashes, lesions or ulcers Psychiatry: Judgement and insight appear normal. Mood & affect appropriate.     Data Reviewed: I have personally reviewed following labs and imaging studies  CBC:  Recent Labs  Lab 08/10/17 1751  WBC 7.1  NEUTROABS 3.8  HGB 12.4  HCT 36.7  MCV 92.7  PLT 282   Basic Metabolic Panel: Recent Labs  Lab 08/10/17 1751  NA 134*  K 3.8  CL 104  CO2 25  GLUCOSE 101*  BUN 13  CREATININE 0.82  CALCIUM 8.7*   Liver Function Tests: Recent Labs  Lab 08/10/17 1751  AST 50*  ALT 41  ALKPHOS 55  BILITOT 0.6  PROT 7.6   ALBUMIN 3.8   Cardiac Enzymes: Recent Labs  Lab 08/10/17 1751  TROPONINI <0.03       Radiology Studies: Dg Chest 2 View  Result Date: 08/10/2017 CLINICAL DATA:  Pt c/o sob x 1 week and swelling in lower extremities x 1 day. Hx of htn and stroke. EXAM: CHEST - 2 VIEW COMPARISON:  Chest x-ray dated 12/03/2016. FINDINGS: Stable cardiomegaly. Overall cardiomediastinal silhouette is stable given the slightly oblique patient positioning. Lungs are clear. No pleural effusion or pneumothorax seen. No acute or suspicious osseous finding. IMPRESSION: No active cardiopulmonary disease. No evidence of pneumonia or pulmonary edema. Stable cardiomegaly. Electronically Signed   By: Bary Richard M.D.   On: 08/10/2017 18:29   Ct Angio Chest Pe W And/or Wo Contrast  Result Date: 08/10/2017 CLINICAL DATA:  Shortness of breath and bilateral lower extremity swelling EXAM: CT ANGIOGRAPHY CHEST WITH CONTRAST TECHNIQUE: Multidetector CT imaging of the chest was performed using the standard protocol during bolus administration of intravenous contrast. Multiplanar CT image reconstructions and MIPs were obtained to evaluate the vascular anatomy. CONTRAST:  ISOVUE-370 IOPAMIDOL (ISOVUE-370) INJECTION 76% COMPARISON:  CTA chest 06/27/2016 FINDINGS: Cardiovascular: --Pulmonary arteries: Satisfactory contrast bolus. Beam attenuation caused by patient body habitus limits assessment beyond the proximal segmental level.There is no pulmonary embolus. The main pulmonary artery is within normal limits for size. --Aorta: Limited opacification of the aorta due to bolus timing optimization for the pulmonary arteries. Conventional 3 vessel aortic branching pattern. The aortic course and caliber are normal. There is mildaortic atherosclerosis. --Heart: Normal size. No pericardial effusion. Mediastinum/Nodes: Small hiatal hernia. No mediastinal adenopathy. No axillary adenopathy. The visualized thyroid gland is normal.  Lungs/Pleura: No pulmonary nodules or masses. No pleural effusion or pneumothorax. No focal airspace consolidation. No focal pleural abnormality. Upper Abdomen: Contrast bolus timing is not optimized for evaluation of the abdominal organs. Within this limitation, the visualized organs of the upper abdomen are normal. Musculoskeletal: No chest wall abnormality. No acute or significant osseous findings. Review of the MIP images confirms the above findings. IMPRESSION: 1. Beam attenuation caused by patient body habitus. Image quality, limiting assessment beyond the proximal segmental pulmonary arteries. Within that limitation, there is no proximal pulmonary embolus. 2. No other acute thoracic abnormality. 3. Small hiatal hernia. 4. Mild aortic atherosclerosis (ICD10-I70.0). Electronically Signed   By: Deatra Robinson M.D.   On: 08/10/2017 19:15        Scheduled Meds: . azithromycin  250 mg Oral Daily  . chlorhexidine  15 mL Mouth Rinse BID  . chlorthalidone  25 mg Oral Daily  . clopidogrel  75 mg Oral Daily  . enoxaparin (LOVENOX) injection  70 mg Subcutaneous Daily  . losartan  50 mg Oral Daily  . mouth rinse  15 mL Mouth Rinse q12n4p  . mometasone-formoterol  2 puff Inhalation BID  . pantoprazole  40 mg Oral BID  . predniSONE  40 mg Oral Q breakfast  . tiotropium  18 mcg Inhalation Daily  Continuous Infusions:   LOS: 0 days     Marcellus Scott, MD, FACP, [facility] Pager [phone]  If 7PM-7AM, please contact night-coverage [email or web address] Password [email or web address] 08/12/2017, 4:00 PM

## 2017-08-13 DIAGNOSIS — I5033 Acute on chronic diastolic (congestive) heart failure: Secondary | ICD-10-CM | POA: Diagnosis not present

## 2017-08-13 DIAGNOSIS — I5032 Chronic diastolic (congestive) heart failure: Secondary | ICD-10-CM | POA: Diagnosis not present

## 2017-08-13 DIAGNOSIS — I10 Essential (primary) hypertension: Secondary | ICD-10-CM | POA: Diagnosis not present

## 2017-08-13 DIAGNOSIS — J441 Chronic obstructive pulmonary disease with (acute) exacerbation: Secondary | ICD-10-CM | POA: Diagnosis not present

## 2017-08-13 DIAGNOSIS — G4733 Obstructive sleep apnea (adult) (pediatric): Secondary | ICD-10-CM | POA: Diagnosis not present

## 2017-08-13 LAB — BASIC METABOLIC PANEL
Anion gap: 11 (ref 5–15)
BUN: 14 mg/dL (ref 6–20)
CO2: 26 mmol/L (ref 22–32)
Calcium: 9.5 mg/dL (ref 8.9–10.3)
Chloride: 101 mmol/L (ref 101–111)
Creatinine, Ser: 0.87 mg/dL (ref 0.44–1.00)
GFR calc Af Amer: >60 mL/min (ref >60)
GLUCOSE: 108 mg/dL — AB (ref 65–99)
POTASSIUM: 3.6 mmol/L (ref 3.5–5.1)
Sodium: 138 mmol/L (ref 135–145)

## 2017-08-13 MED ORDER — PREDNISONE 10 MG PO TABS: ORAL_TABLET | ORAL | 0 refills | Status: DC

## 2017-08-13 MED ORDER — ALBUTEROL SULFATE HFA 108 (90 BASE) MCG/ACT IN AERS: 2.0000 | INHALATION_SPRAY | Freq: Four times a day (QID) | RESPIRATORY_TRACT | 0 refills | Status: DC | PRN

## 2017-08-13 MED ORDER — ALBUTEROL SULFATE (2.5 MG/3ML) 0.083% IN NEBU: 2.5000 mg | INHALATION_SOLUTION | RESPIRATORY_TRACT | 0 refills | Status: DC | PRN

## 2017-08-13 MED ORDER — BUDESONIDE-FORMOTEROL FUMARATE 160-4.5 MCG/ACT IN AERO: 2.0000 | INHALATION_SPRAY | Freq: Two times a day (BID) | RESPIRATORY_TRACT | 0 refills | Status: DC

## 2017-08-13 MED ORDER — TIOTROPIUM BROMIDE MONOHYDRATE 18 MCG IN CAPS: 18.0000 ug | ORAL_CAPSULE | Freq: Every day | RESPIRATORY_TRACT | 0 refills | Status: DC

## 2017-08-13 MED ORDER — FELODIPINE ER 5 MG PO TB24: 5.0000 mg | ORAL_TABLET | Freq: Every day | ORAL | 0 refills | Status: DC

## 2017-08-13 MED ORDER — AZITHROMYCIN 250 MG PO TABS: 250.0000 mg | ORAL_TABLET | Freq: Every day | ORAL | 0 refills | Status: DC

## 2017-08-13 MED FILL — predniSONE 10 MG TABS: 10 | 6 days supply | Qty: 12 | Fill #0

## 2017-08-13 MED FILL — ALBUTEROL SUL 2.5 MG/3 ML S: (2.5 MG/3ML | 4 days supply | Qty: 75 | Fill #0

## 2017-08-13 MED FILL — SYMBICORT 160-4.5 MCG INH: 160-4.5 | 30 days supply | Qty: 10 | Fill #0

## 2017-08-13 MED FILL — AZITHROMYCIN 250 MG TABLET: 250 | 2 days supply | Qty: 2 | Fill #0

## 2017-08-13 MED FILL — PROAIR HFA 90 MCG INHALER: 108 (90 BAS | 25 days supply | Qty: 9 | Fill #0

## 2017-08-13 MED FILL — FELODIPINE ER 5 MG TABLET: 5 | 30 days supply | Qty: 30 | Fill #0

## 2017-08-13 MED FILL — SPIRIVA 18 MCG CP-HANDIHALE: 18 | 30 days supply | Qty: 30 | Fill #0

## 2017-08-13 NOTE — Care Management Note (Signed)
Case Management Note  Patient Details  Name: Autumn Young MRN: [id number] Date of Birth: Jul 04, 1945  Subjective/Objective:                  Does not have PCP in this area/pt does not have card with her at this time. Instructed patient and daughter to call the customer service number on the back of her card as soon as she gets home and get a list of primary care providers in her network.  Explain to the customer service that she need an hosital f/u appointment within on week and they should help her get the appointment made.  [facility] can assign her a pcp.  Action/Plan: Patient will call customer service to find an in network md today.  Expected Discharge Date:                  Expected Discharge Plan:     In-House Referral:     Discharge planning Services     Post Acute Care Choice:    Choice offered to:     DME Arranged:    DME Agency:     HH Arranged:    HH Agency:     Status of Service:     If discussed at [employer], dates discussed:    Additional Comments:  Golda Acre, RN 08/13/2017, 12:02 PM

## 2017-08-13 NOTE — Progress Notes (Signed)
SATURATION QUALIFICATIONS:  Patient Saturations on Room Air at Rest = 99%  Patient Saturations on Room Air while Ambulating = 95%  Patient Saturations on 0 Liters of oxygen while Ambulating = NA%  Please briefly explain why patient needs home oxygen: Pt does not need home o2

## 2017-08-13 NOTE — Discharge Summary (Addendum)
Physician Discharge Summary  Autumn Young ZOX:[id number] DOB: 26-Aug-1945  PCP: Patient, No Pcp Per  Admit date: 08/10/2017 Discharge date: 08/13/2017  Recommendations for Outpatient Follow-up:  1. New PCP in 1 week.  To be seen with repeat labs (CBC & BMP).  Case management has assisted patient with finding a new PCP. 2. Dr. Elson Clan, Cardiology in 1 week.  Home Health: None Equipment/Devices: None  Discharge Condition: Improved and stable CODE STATUS: Full Diet recommendation: Heart healthy diet  Discharge Diagnoses:  Principal Problem:   COPD exacerbation (HCC) Active Problems:   Essential hypertension   Pulmonary hypertension (HCC)   OSA (obstructive sleep apnea)   Brief Summary: 72 year old female, lives with family, uses cane at times to ambulate, PMH of asthma/COPD, OSA on CPAP, HTN, stroke, GERD presented to [facility] ED due to progressive lower extremity edema, dyspnea, wheezing, dry cough and weight gain and was exposed to her grandkids with URTI.  Admitted for possible COPD exacerbation.   Assessment & Plan:   COPD exacerbation: Remote former smoker.  Treated with oral azithromycin, prednisone, Spiriva, albuterol inhalers and Dulera.  CTA chest: Limited but no PE noted. Dyspnea on exertion is likely multifactorial due to severe OSA, OHS, COPD and chronic diastolic CHF.  Clinically improved.  Exacerbation seems to have resolved.  Patient requests prescription for her breathing treatments.  OSA/OHS: Continue nightly CPAP.  Essential hypertension: Uncontrolled on current home regimen of losartan 50 mg daily and chlorthalidone 25 mg daily.  Supposed to be on felodipine 10 mg daily based on cardiology follow-up, but was not on this PTA.  Started felodipine at 2.5 mg daily and will increase to 5 mg daily at discharge.  Close outpatient follow-up with PCP or cardiology and may need further adjustments.  History of stroke: Continue Plavix.  Aspirin discontinued during  Cardiology follow-up 05/20/2017.  Patient refused statins.  GERD: Continue PPI.  Morbid obesity/Body mass index is 49.52 kg/m.   Needs to lose weight.  Chronic diastolic CHF: TTE 07/20/2017: LVEF 65-70%, mild LVH, grade 1 diastolic dysfunction.  Presented with weight of 283 which had gone up to 306 pounds. She seems to have progressively gained weight based on outpatient cardiology follow-up from February.   Weight is down to 300 pounds on day of discharge.  Diuresing well on home diuretic regimen, put out 2300 mL of urine (may not include unmeasured urine output) yesterday and -1.7 L since admission.  Reports significant allergy to Lasix.  Recommended outpatient close follow-up with Cardiology and she verbalized understanding.    Consultants:  None  Procedures:  CPAP    Discharge Instructions  Discharge Instructions    (HEART FAILURE PATIENTS) Call MD:  Anytime you have any of the following symptoms: 1) 3 pound weight gain in 24 hours or 5 pounds in 1 week 2) shortness of breath, with or without a dry hacking cough 3) swelling in the hands, feet or stomach 4) if you have to sleep on extra pillows at night in order to breathe.   Complete by:  As directed    Call MD for:  difficulty breathing, headache or visual disturbances   Complete by:  As directed    Call MD for:  extreme fatigue   Complete by:  As directed    Call MD for:  persistant dizziness or light-headedness   Complete by:  As directed    Call MD for:  temperature >100.4   Complete by:  As directed    Diet - low sodium  heart healthy   Complete by:  As directed    Increase activity slowly   Complete by:  As directed        Medication List    TAKE these medications   albuterol 108 (90 Base) MCG/ACT inhaler Commonly known as:  PROVENTIL HFA Inhale 2 puffs into the lungs every 6 (six) hours as needed for wheezing or shortness of breath. What changed:  reasons to take this   albuterol (2.5 MG/3ML) 0.083%  nebulizer solution Commonly known as:  PROVENTIL Inhale 3 mLs (2.5 mg total) into the lungs every 4 (four) hours as needed for wheezing or shortness of breath. What changed:  You were already taking a medication with the same name, and this prescription was added. Make sure you understand how and when to take each.   azithromycin 250 MG tablet Commonly known as:  ZITHROMAX Take 1 tablet (250 mg total) by mouth daily. Start taking on:  08/14/2017   budesonide-formoterol 160-4.5 MCG/ACT inhaler Commonly known as:  SYMBICORT Inhale 2 puffs into the lungs 2 (two) times daily.   chlorthalidone 25 MG tablet Commonly known as:  HYGROTON Take 25 mg by mouth daily.   clopidogrel 75 MG tablet Commonly known as:  PLAVIX Take 75 mg by mouth daily.   felodipine 5 MG 24 hr tablet Commonly known as:  PLENDIL Take 1 tablet (5 mg total) by mouth daily. Start taking on:  08/14/2017   losartan 50 MG tablet Commonly known as:  COZAAR Take 50 mg by mouth daily.   pantoprazole 40 MG tablet Commonly known as:  PROTONIX Take 40 mg by mouth 2 (two) times daily.   predniSONE 10 MG tablet Commonly known as:  DELTASONE Take 3 tabs daily for 2 days, then 2 tabs daily for 2 days, then 1 tab daily for 2 days, then stop. Start taking on:  08/14/2017   tiotropium 18 MCG inhalation capsule Commonly known as:  SPIRIVA Place 1 capsule (18 mcg total) into inhaler and inhale daily. Start taking on:  08/14/2017      Follow-up Information    Family Physician. Schedule an appointment as soon as possible for a visit in 1 week(s).   Why:  To be seen with repeat labs (CBC & BMP).       Pulikkotil, Judith Part, MD. Schedule an appointment as soon as possible for a visit in 1 week(s).   Specialty:  Internal Medicine Contact information: [address] [place] [address] [phone]          Allergies  Allergen Reactions  . Simvastatin Swelling  . Lasix [Furosemide] Swelling   . Latex     rash  . Penicillins Rash    Has patient had a PCN reaction causing immediate rash, facial/tongue/throat swelling, SOB or lightheadedness with hypotension: Unknown Has patient had a PCN reaction causing severe rash involving mucus membranes or skin necrosis: No Has patient had a PCN reaction that required hospitalization: No Has patient had a PCN reaction occurring within the last 10 years: No If all of the above answers are "NO", then may proceed with Cephalosporin use.       Procedures/Studies: Dg Chest 2 View  Result Date: 08/10/2017 CLINICAL DATA:  Pt c/o sob x 1 week and swelling in lower extremities x 1 day. Hx of htn and stroke. EXAM: CHEST - 2 VIEW COMPARISON:  Chest x-ray dated 12/03/2016. FINDINGS: Stable cardiomegaly. Overall cardiomediastinal silhouette is stable given the slightly oblique patient positioning. Lungs  are clear. No pleural effusion or pneumothorax seen. No acute or suspicious osseous finding. IMPRESSION: No active cardiopulmonary disease. No evidence of pneumonia or pulmonary edema. Stable cardiomegaly. Electronically Signed   By: Bary Richard M.D.   On: 08/10/2017 18:29   Ct Angio Chest Pe W And/or Wo Contrast  Result Date: 08/10/2017 CLINICAL DATA:  Shortness of breath and bilateral lower extremity swelling EXAM: CT ANGIOGRAPHY CHEST WITH CONTRAST TECHNIQUE: Multidetector CT imaging of the chest was performed using the standard protocol during bolus administration of intravenous contrast. Multiplanar CT image reconstructions and MIPs were obtained to evaluate the vascular anatomy. CONTRAST:  ISOVUE-370 IOPAMIDOL (ISOVUE-370) INJECTION 76% COMPARISON:  CTA chest 06/27/2016 FINDINGS: Cardiovascular: --Pulmonary arteries: Satisfactory contrast bolus. Beam attenuation caused by patient body habitus limits assessment beyond the proximal segmental level.There is no pulmonary embolus. The main pulmonary artery is within normal limits for size. --Aorta:  Limited opacification of the aorta due to bolus timing optimization for the pulmonary arteries. Conventional 3 vessel aortic branching pattern. The aortic course and caliber are normal. There is mildaortic atherosclerosis. --Heart: Normal size. No pericardial effusion. Mediastinum/Nodes: Small hiatal hernia. No mediastinal adenopathy. No axillary adenopathy. The visualized thyroid gland is normal. Lungs/Pleura: No pulmonary nodules or masses. No pleural effusion or pneumothorax. No focal airspace consolidation. No focal pleural abnormality. Upper Abdomen: Contrast bolus timing is not optimized for evaluation of the abdominal organs. Within this limitation, the visualized organs of the upper abdomen are normal. Musculoskeletal: No chest wall abnormality. No acute or significant osseous findings. Review of the MIP images confirms the above findings. IMPRESSION: 1. Beam attenuation caused by patient body habitus. Image quality, limiting assessment beyond the proximal segmental pulmonary arteries. Within that limitation, there is no proximal pulmonary embolus. 2. No other acute thoracic abnormality. 3. Small hiatal hernia. 4. Mild aortic atherosclerosis (ICD10-I70.0). Electronically Signed   By: Deatra Robinson M.D.   On: 08/10/2017 19:15      Subjective: Patient was interviewed and examined along with RN in room.  States that she feels much better.  Reports dyspnea resolved and her breathing is almost back to baseline.  No chest pain, dizziness or lightheadedness reported.  No cough reported.  Indicates that her lower extremity edema continues to gradually improve.  As per RN, no desaturation even with ambulation on room air.  Discharge Exam:  Vitals:   08/13/17 0531 08/13/17 0650 08/13/17 0714 08/13/17 0956  BP: (!) 179/82     Pulse: (!) 53     Resp: 20     Temp: 97.7 F (36.5 C)     TempSrc: Oral     SpO2: 94%  96% 96%  Weight:  (!) 136.4 kg (300 lb 9.6 oz)    Height:        General exam:  Pleasant elderly female, moderately built and morbidly obese, sitting up comfortably at edge of bed. Respiratory system:  Clear to auscultation. Respiratory effort normal. Cardiovascular system: S1 & S2 heard, RRR. No JVD, murmurs, rubs, gallops or clicks.  Trace bilateral leg edema. Gastrointestinal system: Abdomen is nondistended, soft and nontender. No organomegaly or masses felt. Normal bowel sounds heard. Central nervous system: Alert and oriented. No focal neurological deficits. Extremities: Symmetric 5 x 5 power. Skin: No rashes, lesions or ulcers Psychiatry: Judgement and insight appear normal. Mood & affect appropriate.       The results of significant diagnostics from this hospitalization (including imaging, microbiology, ancillary and laboratory) are listed below for reference.  Microbiology: No results found for this or any previous visit (from the past 240 hour(s)).   Labs: CBC: Recent Labs  Lab 08/10/17 1751  WBC 7.1  NEUTROABS 3.8  HGB 12.4  HCT 36.7  MCV 92.7  PLT 282   Basic Metabolic Panel: Recent Labs  Lab 08/10/17 1751 08/13/17 0606  NA 134* 138  K 3.8 3.6  CL 104 101  CO2 25 26  GLUCOSE 101* 108*  BUN 13 14  CREATININE 0.82 0.87  CALCIUM 8.7* 9.5   Liver Function Tests: Recent Labs  Lab 08/10/17 1751  AST 50*  ALT 41  ALKPHOS 55  BILITOT 0.6  PROT 7.6  ALBUMIN 3.8   BNP (last 3 results) Recent Labs    12/03/16 1125 08/10/17 1751  BNP 51.5 44.6   Cardiac Enzymes: Recent Labs  Lab 08/10/17 1751  TROPONINI <0.03   I discussed in detail with patient's daughter.  Updated care and answered questions.  Time coordinating discharge: 35 minutes  SIGNED:  Marcellus Scott, MD, FACP, [facility]. [facility] Pager [phone]  If 7PM-7AM, please contact night-coverage [email or web address] Password [facility] 08/13/2017, 12:19 PM

## 2017-08-13 NOTE — Discharge Instructions (Signed)
Please get your medications reviewed and adjusted by your Primary MD. ° °Please request your Primary MD to go over all Hospital Tests and Procedure/Radiological results at the follow up, please get all Hospital records sent to your Prim MD by signing hospital release before you go home. ° °If you had Pneumonia of Lung problems at the Hospital: °Please get a 2 view Chest X ray done in 6-8 weeks after hospital discharge or sooner if instructed by your Primary MD. ° °If you have Congestive Heart Failure: °Please call your Cardiologist or Primary MD anytime you have any of the following symptoms:  °1) 3 pound weight gain in 24 hours or 5 pounds in 1 week  °2) shortness of breath, with or without a dry hacking cough  °3) swelling in the hands, feet or stomach  °4) if you have to sleep on extra pillows at night in order to breathe ° °Follow cardiac low salt diet and 1.5 lit/day fluid restriction. ° °If you have diabetes °Accuchecks 4 times/day, Once in AM empty stomach and then before each meal. °Log in all results and show them to your primary doctor at your next visit. °If any glucose reading is under 80 or above 300 call your primary MD immediately. ° °If you have Seizure/Convulsions/Epilepsy: °Please do not drive, operate heavy machinery, participate in activities at heights or participate in high speed sports until you have seen by Primary MD or a Neurologist and advised to do so again. ° °If you had Gastrointestinal Bleeding: °Please ask your Primary MD to check a complete blood count within one week of discharge or at your next visit. Your endoscopic/colonoscopic biopsies that are pending at the time of discharge, will also need to followed by your Primary MD. ° °Get Medicines reviewed and adjusted. °Please take all your medications with you for your next visit with your Primary MD ° °Please request your Primary MD to go over all hospital tests and procedure/radiological results at the follow up, please ask your  Primary MD to get all Hospital records sent to his/her office. ° °If you experience worsening of your admission symptoms, develop shortness of breath, life threatening emergency, suicidal or homicidal thoughts you must seek medical attention immediately by calling 911 or calling your MD immediately  if symptoms less severe. ° °You must read complete instructions/literature along with all the possible adverse reactions/side effects for all the Medicines you take and that have been prescribed to you. Take any new Medicines after you have completely understood and accpet all the possible adverse reactions/side effects.  ° °Do not drive or operate heavy machinery when taking Pain medications.  ° °Do not take more than prescribed Pain, Sleep and Anxiety Medications ° °Special Instructions: If you have smoked or chewed Tobacco  in the last 2 yrs please stop smoking, stop any regular Alcohol  and or any Recreational drug use. ° °Wear Seat belts while driving. ° °Please note °You were cared for by a hospitalist during your hospital stay. If you have any questions about your discharge medications or the care you received while you were in the hospital after you are discharged, you can call the unit and asked to speak with the hospitalist on call if the hospitalist that took care of you is not available. Once you are discharged, your primary care physician will handle any further medical issues. Please note that NO REFILLS for any discharge medications will be authorized once you are discharged, as it is imperative that you   return to your primary care physician (or establish a relationship with a primary care physician if you do not have one) for your aftercare needs so that they can reassess your need for medications and monitor your lab values.  You can reach the hospitalist office at phone [phone] or fax [phone]   If you do not have a primary care physician, you can call [phone] for a physician  referral.   Chronic Obstructive Pulmonary Disease Exacerbation Chronic obstructive pulmonary disease (COPD) is a long-term (chronic) condition that affects the lungs. COPD is a general term that can be used to describe many different lung problems that cause lung swelling (inflammation) and limit airflow, including chronic bronchitis and emphysema. COPD exacerbations are episodes when breathing symptoms become much worse and require extra treatment. COPD exacerbations are usually caused by infections. Without treatment, COPD exacerbations can be severe and even life threatening. Frequent COPD exacerbations can cause further damage to the lungs. What are the causes? This condition may be caused by:  Respiratory infections, including viral and bacterial infections.  Exposure to smoke.  Exposure to air pollution, chemical fumes, or dust.  Things that give you an allergic reaction (allergens).  Not taking your usual COPD medicines as directed.  Underlying medical problems, such as congestive heart failure or infections not involving the lungs.  In many cases, the cause (trigger) of this condition is not known. What increases the risk? The following factors may make you more likely to develop this condition:  Smoking cigarettes.  Old age.  Frequent prior COPD exacerbations.  What are the signs or symptoms? Symptoms of this condition include:  Increased coughing.  Increased production of mucus from your lungs (sputum).  Increased wheezing.  Increased shortness of breath.  Rapid or labored breathing.  Chest tightness.  Less energy than usual.  Sleep disruption from symptoms.  Confusion or increased sleepiness.  Often these symptoms happen or get worse even with the use of medicines. How is this diagnosed? This condition is diagnosed based on:  Your medical history.  A physical exam.  You may also have tests, including:  A chest X-ray.  Blood tests.  Lung  (pulmonary) function tests.  How is this treated? Treatment for this condition depends on the severity and cause of the symptoms. You may need to be admitted to a hospital for treatment. Some of the treatments commonly used to treat COPD exacerbations are:  Antibiotic medicines. These may be used for severe exacerbations caused by a lung infection, such as pneumonia.  Bronchodilators. These are inhaled medicines that expand the air passages and allow increased airflow.  Steroid medicines. These act to reduce inflammation in the airways. They may be given with an inhaler, taken by mouth, or given through an IV tube inserted into one of your veins.  Supplemental oxygen therapy.  Airway clearing techniques, such as noninvasive ventilation (NIV) and positive expiratory pressure (PEP). These provide respiratory support through a mask or other noninvasive device. An example of this would be using a continuous positive airway pressure (CPAP) machine to improve delivery of oxygen into your lungs.  Follow these instructions at home: Medicines  Take over-the-counter and prescription medicines only as told by your health care provider. It is important to use correct technique with inhaled medicines.  If you were prescribed an antibiotic medicine or oral steroid, take it as told by your health care provider. Do not stop taking the medicine even if you start to feel better. Lifestyle  Eat a healthy  diet.  Exercise regularly.  Get plenty of sleep.  Avoid exposure to all substances that irritate the airway, especially to tobacco smoke.  Wash your hands often with soap and water to reduce the risk of infection. If soap and water are not available, use hand sanitizer.  During flu season, avoid enclosed spaces that are crowded with people. General instructions  Drink enough fluid to keep your urine clear or pale yellow (unless you have a medical condition that requires fluid restriction).  Use a  cool mist vaporizer. This humidifies the air and makes it easier for you to clear your chest when you cough.  If you have a home nebulizer and oxygen, continue to use them as told by your health care provider.  Keep all follow-up visits as told by your health care provider. This is important. How is this prevented?  Stay up-to-date on pneumococcal and influenza (flu) vaccines. A flu shot is recommended every year to help prevent exacerbations.  Do not use any products that contain nicotine or tobacco, such as cigarettes and e-cigarettes. Quitting smoking is very important in preventing COPD from getting worse and in preventing exacerbations from happening as often. If you need help quitting, ask your health care provider.  Follow all instructions for pulmonary rehabilitation after a recent exacerbation. This can help prevent future exacerbations.  Work with your health care provider to develop and follow an action plan. This tells you what steps to take when you experience certain symptoms. Contact a health care provider if:  You have a worsening of your regular COPD symptoms. Get help right away if:  You have worsening shortness of breath, even when resting.  You have trouble talking.  You have severe chest pain.  You cough up blood.  You have a fever.  You have weakness, vomit repeatedly, or faint.  You feel confused.  You are not able to sleep because of your symptoms.  You have trouble doing daily activities. Summary  COPD exacerbations are episodes when breathing symptoms become much worse and require extra treatment above your normal treatment.  Exacerbations can be severe and even life threatening. Frequent COPD exacerbations can cause further damage to your lungs.  COPD exacerbations are usually triggered by infections such as the flu, colds, and even pneumonia.  Treatment for this condition depends on the severity and cause of the symptoms. You may need to be  admitted to a hospital for treatment.  Quitting smoking is very important to prevent COPD from getting worse and to prevent exacerbations from happening as often. This information is not intended to replace advice given to you by your health care provider. Make sure you discuss any questions you have with your health care provider. Document Released: 01/12/2007 Document Revised: 04/21/2016 Document Reviewed: 04/21/2016 Elsevier Interactive Patient Education  2018 [employer]    Heart Failure Heart failure is a condition in which the heart has trouble pumping blood because it has become weak or stiff. This means that the heart does not pump blood efficiently for the body to work well. For some people with heart failure, fluid may back up into the lungs and there may be swelling (edema) in the lower legs. Heart failure is usually a long-term (chronic) condition. It is important for you to take good care of yourself and follow the treatment plan from your health care provider. What are the causes? This condition is caused by some health problems, including:  High blood pressure (hypertension). Hypertension causes the heart muscle  to work harder than normal. High blood pressure eventually causes the heart to become stiff and weak.  Coronary artery disease (CAD). CAD is the buildup of cholesterol and fat (plaques) in the arteries of the heart.  Heart attack (myocardial infarction). Injured tissue, which is caused by the heart attack, does not contract as well and the heart's ability to pump blood is weakened.  Abnormal heart valves. When the heart valves do not open and close properly, the heart muscle must pump harder to keep the blood flowing.  Heart muscle disease (cardiomyopathy or myocarditis). Heart muscle disease is damage to the heart muscle from a variety of causes, such as drug or alcohol abuse, infections, or unknown causes. These can increase the risk of heart failure.  Lung  disease. When the lungs do not work properly, the heart must work harder.  What increases the risk? Risk of heart failure increases as a person ages. This condition is also more likely to develop in people who:  Are overweight.  Are female.  Smoke or chew tobacco.  Abuse alcohol or illegal drugs.  Have taken medicines that can damage the heart, such as chemotherapy drugs.  Have diabetes. ? High blood sugar (glucose) is associated with high fat (lipid) levels in the blood. ? Diabetes can also damage tiny blood vessels that carry nutrients to the heart muscle.  Have abnormal heart rhythms.  Have thyroid problems.  Have low blood counts (anemia).  What are the signs or symptoms? Symptoms of this condition include:  Shortness of breath with activity, such as when climbing stairs.  Persistent cough.  Swelling of the feet, ankles, legs, or abdomen.  Unexplained weight gain.  Difficulty breathing when lying flat (orthopnea).  Waking from sleep because of the need to sit up and get more air.  Rapid heartbeat.  Fatigue and loss of energy.  Feeling light-headed, dizzy, or close to fainting.  Loss of appetite.  Nausea.  Increased urination during the night (nocturia).  Confusion.  How is this diagnosed? This condition is diagnosed based on:  Medical history, symptoms, and a physical exam.  Diagnostic tests, which may include: ? Echocardiogram. ? Electrocardiogram (ECG). ? Chest X-ray. ? Blood tests. ? Exercise stress test. ? Radionuclide scans. ? Cardiac catheterization and angiogram.  How is this treated? Treatment for this condition is aimed at managing the symptoms of heart failure. Medicines, behavioral changes, or other treatments may be necessary to treat heart failure. Medicines These may include:  Angiotensin-converting enzyme (ACE) inhibitors. This type of medicine blocks the effects of a blood protein called angiotensin-converting enzyme. ACE  inhibitors relax (dilate) the blood vessels and help to lower blood pressure.  Angiotensin receptor blockers (ARBs). This type of medicine blocks the actions of a blood protein called angiotensin. ARBs dilate the blood vessels and help to lower blood pressure.  Water pills (diuretics). Diuretics cause the kidneys to remove salt and water from the blood. The extra fluid is removed through urination, leaving a lower volume of blood that the heart has to pump.  Beta blockers. These improve heart muscle strength and they prevent the heart from beating too quickly.  Digoxin. This increases the force of the heartbeat.  Healthy behavior changes These may include:  Reaching and maintaining a healthy weight.  Stopping smoking or chewing tobacco.  Eating heart-healthy foods.  Limiting or avoiding alcohol.  Stopping use of street drugs (illegal drugs).  Physical activity.  Other treatments These may include:  Surgery to open blocked coronary arteries  or repair damaged heart valves.  Placement of a biventricular pacemaker to improve heart muscle function (cardiac resynchronization therapy). This device paces both the right ventricle and left ventricle.  Placement of a device to treat serious abnormal heart rhythms (implantable cardioverter defibrillator, or ICD).  Placement of a device to improve the pumping ability of the heart (left ventricular assist device, or LVAD).  Heart transplant. This can cure heart failure, and it is considered for certain patients who do not improve with other therapies.  Follow these instructions at home: Medicines  Take over-the-counter and prescription medicines only as told by your health care provider. Medicines are important in reducing the workload of your heart, slowing the progression of heart failure, and improving your symptoms. ? Do not stop taking your medicine unless your health care provider told you to do that. ? Do not skip any dose of  medicine. ? Refill your prescriptions before you run out of medicine. You need your medicines every day. Eating and drinking   Eat heart-healthy foods. Talk with a dietitian to make an eating plan that is right for you. ? Choose foods that contain no trans fat and are low in saturated fat and cholesterol. Healthy choices include fresh or frozen fruits and vegetables, fish, lean meats, legumes, fat-free or low-fat dairy products, and whole-grain or high-fiber foods. ? Limit salt (sodium) if directed by your health care provider. Sodium restriction may reduce symptoms of heart failure. Ask a dietitian to recommend heart-healthy seasonings. ? Use healthy cooking methods instead of frying. Healthy methods include roasting, grilling, broiling, baking, poaching, steaming, and stir-frying.  Limit your fluid intake if directed by your health care provider. Fluid restriction may reduce symptoms of heart failure. Lifestyle  Stop smoking or using chewing tobacco. Nicotine and tobacco can damage your heart and your blood vessels. Do not use nicotine gum or patches before talking to your health care provider.  Limit alcohol intake to no more than 1 drink per day for non-pregnant women and 2 drinks per day for men. One drink equals 12 oz of beer, 5 oz of wine, or 1 oz of hard liquor. ? Drinking more than that is harmful to your heart. Tell your health care provider if you drink alcohol several times a week. ? Talk with your health care provider about whether any level of alcohol use is safe for you. ? If your heart has already been damaged by alcohol or you have severe heart failure, drinking alcohol should be stopped completely.  Stop use of illegal drugs.  Lose weight if directed by your health care provider. Weight loss may reduce symptoms of heart failure.  Do moderate physical activity if directed by your health care provider. People who are elderly and people with severe heart failure should consult  with a health care provider for physical activity recommendations. Monitor important information  Weigh yourself every day. Keeping track of your weight daily helps you to notice excess fluid sooner. ? Weigh yourself every morning after you urinate and before you eat breakfast. ? Wear the same amount of clothing each time you weigh yourself. ? Record your daily weight. Provide your health care provider with your weight record.  Monitor and record your blood pressure as told by your health care provider.  Check your pulse as told by your health care provider. Dealing with extreme temperatures  If the weather is extremely hot: ? Avoid vigorous physical activity. ? Use air conditioning or fans or seek a cooler location. ?  Avoid caffeine and alcohol. ? Wear loose-fitting, lightweight, and light-colored clothing.  If the weather is extremely cold: ? Avoid vigorous physical activity. ? Layer your clothes. ? Wear mittens or gloves, a hat, and a scarf when you go outside. ? Avoid alcohol. General instructions  Manage other health conditions such as hypertension, diabetes, thyroid disease, or abnormal heart rhythms as told by your health care provider.  Learn to manage stress. If you need help to do this, ask your health care provider.  Plan rest periods when fatigued.  Get ongoing education and support as needed.  Participate in or seek rehabilitation as needed to maintain or improve independence and quality of life.  Stay up to date with immunizations. Keeping current on pneumococcal and influenza immunizations is especially important to prevent respiratory infections.  Keep all follow-up visits as told by your health care provider. This is important. Contact a health care provider if:  You have a rapid weight gain.  You have increasing shortness of breath that is unusual for you.  You are unable to participate in your usual physical activities.  You tire easily.  You cough  more than normal, especially with physical activity.  You have any swelling or more swelling in areas such as your hands, feet, ankles, or abdomen.  You are unable to sleep because it is hard to breathe.  You feel like your heart is beating quickly (palpitations).  You become dizzy or light-headed when you stand up. Get help right away if:  You have difficulty breathing.  You notice or your family notices a change in your awareness, such as having trouble staying awake or having difficulty with concentration.  You have pain or discomfort in your chest.  You have an episode of fainting (syncope). This information is not intended to replace advice given to you by your health care provider. Make sure you discuss any questions you have with your health care provider. Document Released: 03/17/2005 Document Revised: 11/20/2015 Document Reviewed: 10/10/2015 Elsevier Interactive Patient Education  [employer].

## 2017-08-21 DIAGNOSIS — Z6841 Body Mass Index (BMI) 40.0 and over, adult: Secondary | ICD-10-CM | POA: Diagnosis not present

## 2017-08-21 DIAGNOSIS — E669 Obesity, unspecified: Secondary | ICD-10-CM | POA: Diagnosis not present

## 2017-08-21 DIAGNOSIS — Z09 Encounter for follow-up examination after completed treatment for conditions other than malignant neoplasm: Secondary | ICD-10-CM | POA: Diagnosis not present

## 2017-08-21 DIAGNOSIS — J4520 Mild intermittent asthma, uncomplicated: Secondary | ICD-10-CM | POA: Diagnosis not present

## 2017-09-04 DIAGNOSIS — G4733 Obstructive sleep apnea (adult) (pediatric): Secondary | ICD-10-CM | POA: Diagnosis not present

## 2017-09-17 DIAGNOSIS — I5030 Unspecified diastolic (congestive) heart failure: Secondary | ICD-10-CM | POA: Diagnosis not present

## 2017-09-17 DIAGNOSIS — J4520 Mild intermittent asthma, uncomplicated: Secondary | ICD-10-CM | POA: Diagnosis not present

## 2017-09-17 DIAGNOSIS — M7989 Other specified soft tissue disorders: Secondary | ICD-10-CM | POA: Diagnosis not present

## 2017-09-17 DIAGNOSIS — I10 Essential (primary) hypertension: Secondary | ICD-10-CM | POA: Diagnosis not present

## 2017-09-17 DIAGNOSIS — Z23 Encounter for immunization: Secondary | ICD-10-CM | POA: Diagnosis not present

## 2017-10-04 DIAGNOSIS — G4733 Obstructive sleep apnea (adult) (pediatric): Secondary | ICD-10-CM | POA: Diagnosis not present

## 2017-10-20 DIAGNOSIS — G4733 Obstructive sleep apnea (adult) (pediatric): Secondary | ICD-10-CM | POA: Diagnosis not present

## 2017-11-04 DIAGNOSIS — G4733 Obstructive sleep apnea (adult) (pediatric): Secondary | ICD-10-CM | POA: Diagnosis not present

## 2017-12-05 DIAGNOSIS — G4733 Obstructive sleep apnea (adult) (pediatric): Secondary | ICD-10-CM | POA: Diagnosis not present

## 2017-12-11 DIAGNOSIS — E669 Obesity, unspecified: Secondary | ICD-10-CM | POA: Diagnosis not present

## 2017-12-11 DIAGNOSIS — Z6841 Body Mass Index (BMI) 40.0 and over, adult: Secondary | ICD-10-CM | POA: Diagnosis not present

## 2018-01-04 DIAGNOSIS — G4733 Obstructive sleep apnea (adult) (pediatric): Secondary | ICD-10-CM | POA: Diagnosis not present

## 2018-01-12 DIAGNOSIS — E669 Obesity, unspecified: Secondary | ICD-10-CM | POA: Diagnosis not present

## 2018-01-12 DIAGNOSIS — Z6841 Body Mass Index (BMI) 40.0 and over, adult: Secondary | ICD-10-CM | POA: Diagnosis not present

## 2018-01-14 DIAGNOSIS — G4733 Obstructive sleep apnea (adult) (pediatric): Secondary | ICD-10-CM | POA: Diagnosis not present

## 2018-01-24 IMAGING — CT CT ANGIO CHEST
2 of 8 series · 18 of 36 positions shown · IV contrast (isovue)
Comparison: Chest radiograph performed earlier today at [DATE] p.m.

CLINICAL DATA: Acute onset of shortness of breath, worse with
exertion. Chest congestion and nausea. Neck swelling and facial
swelling. Lower extremity edema. Initial encounter.

EXAM:
CT ANGIOGRAPHY CHEST WITH CONTRAST
TECHNIQUE: Multidetector CT imaging of the chest was performed using the
standard protocol during bolus administration of intravenous
contrast. Multiplanar CT image reconstructions and MIPs were
obtained to evaluate the vascular anatomy.
CONTRAST:  100 mL of Isovue 370 IV contrast

[Series 6: pe coronal mpr · coronal · 0.51mm/px · 1 of 151 slices shown]
[im 76/151  mediastinal]
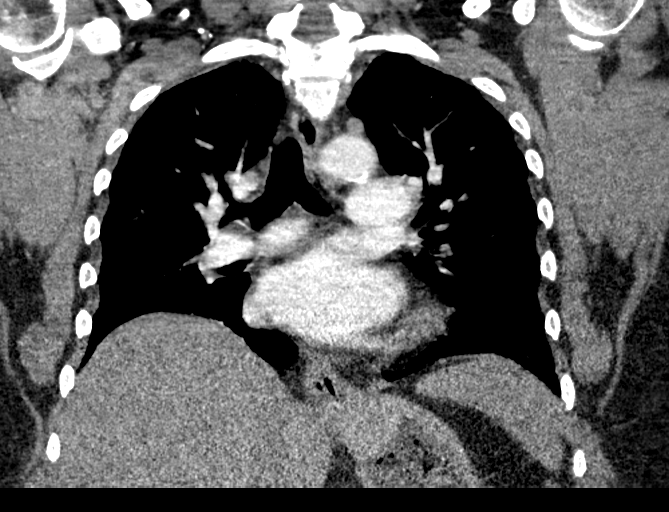

[Series 10: pe thins · axial · 0.86mm/px · z∈[+603,+833]mm · 17 of 258 slices shown]
[im 14/258  lung]
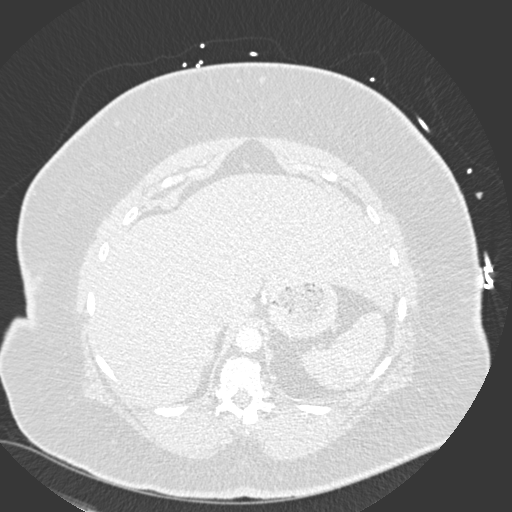
[im 28/258  mediastinal]
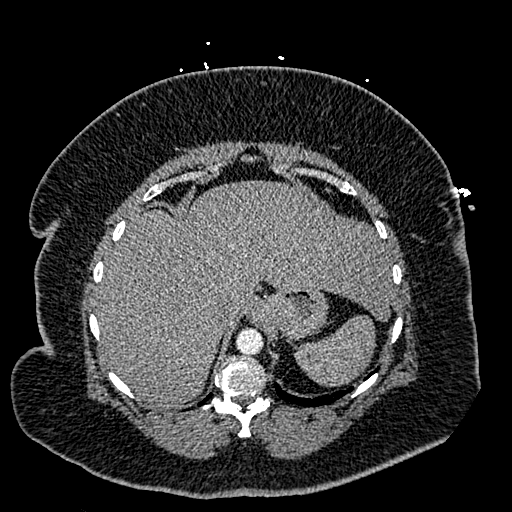
[im 41/258  lung]
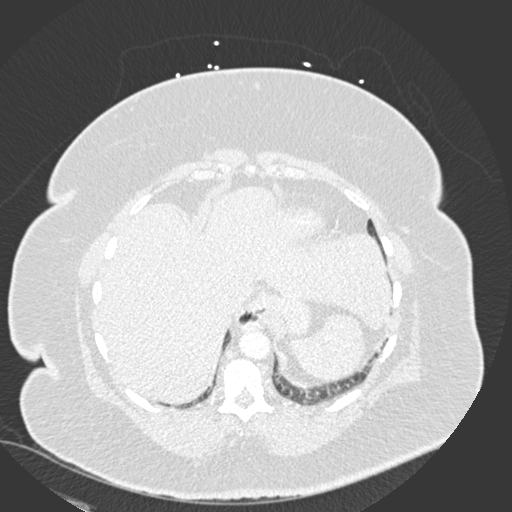
[im 55/258  mediastinal]
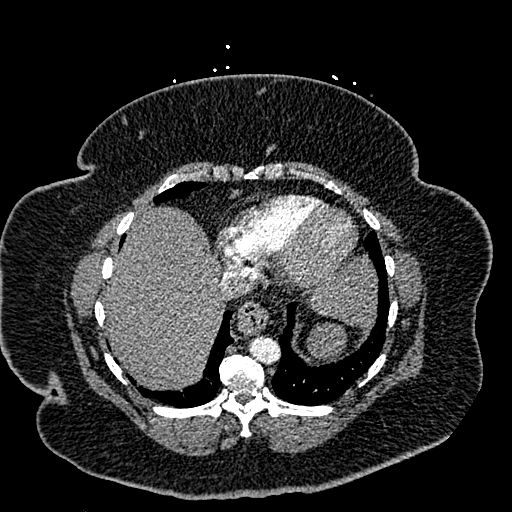
[im 68/258  lung]
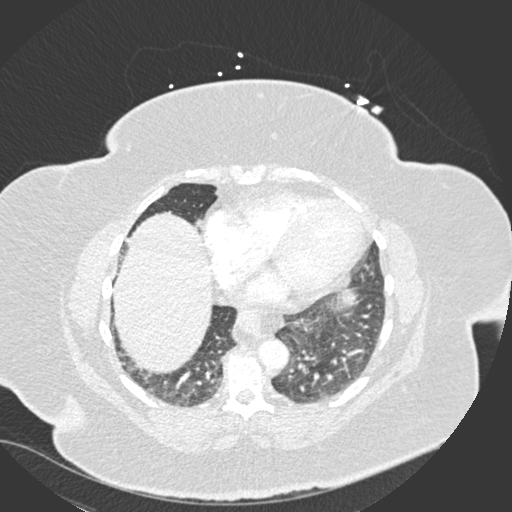
[im 82/258  mediastinal]
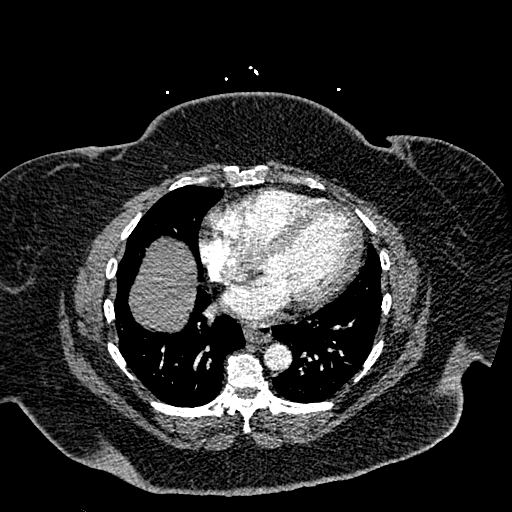
[im 95/258  lung]
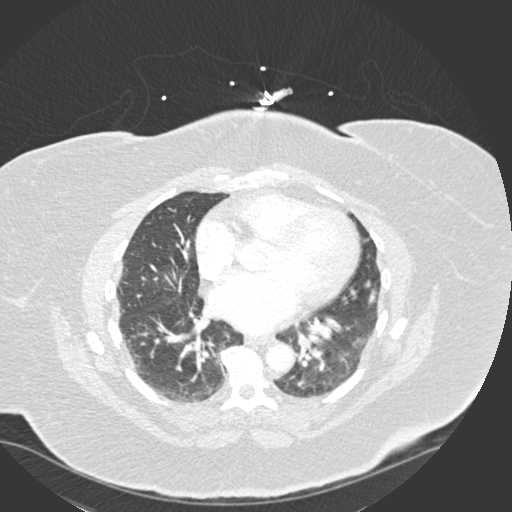
[im 109/258  mediastinal]
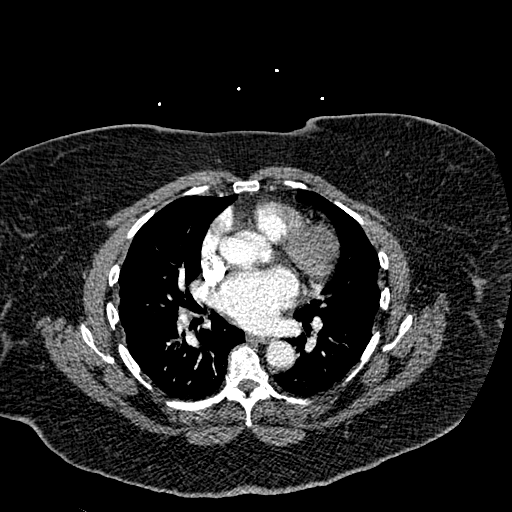
[im 136/258  lung]
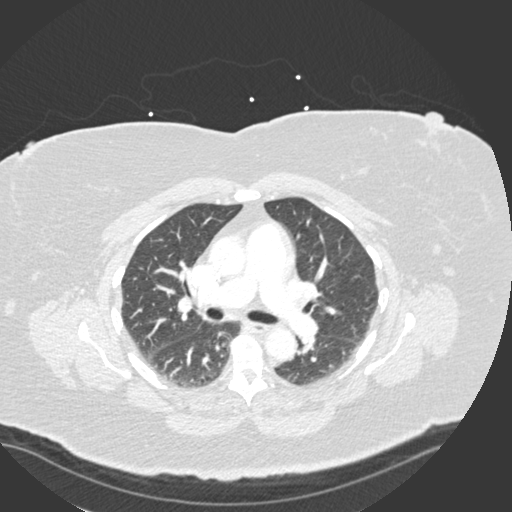
[im 149/258  mediastinal]
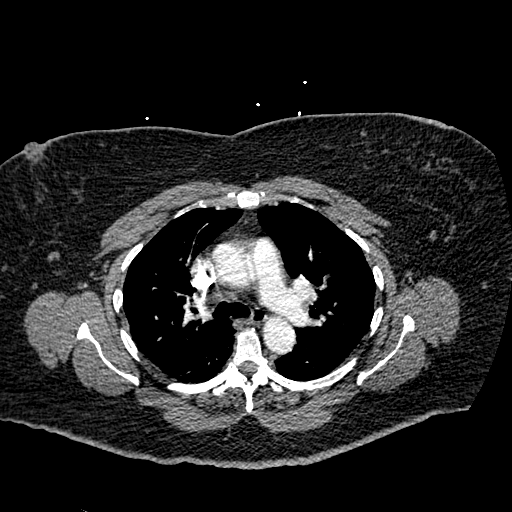
[im 163/258  lung]
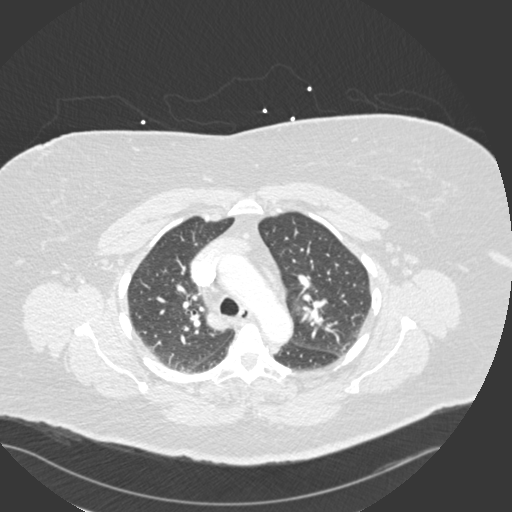
[im 176/258  mediastinal]
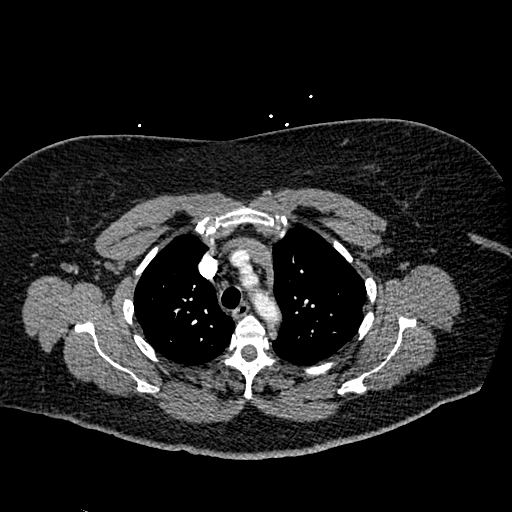
[im 190/258  lung]
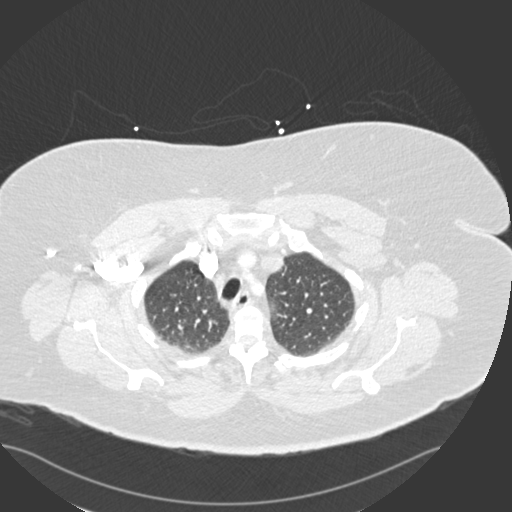
[im 203/258  mediastinal]
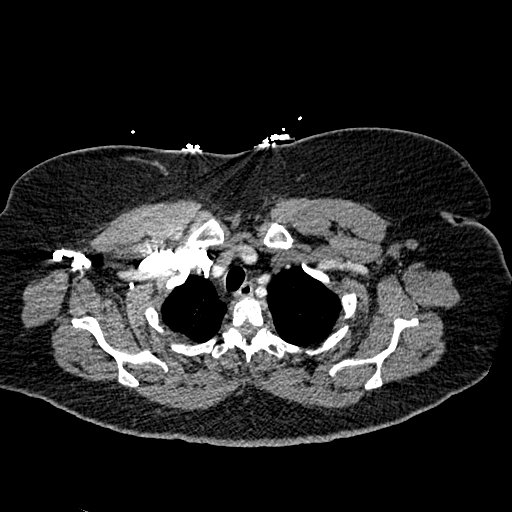
[im 217/258  lung]
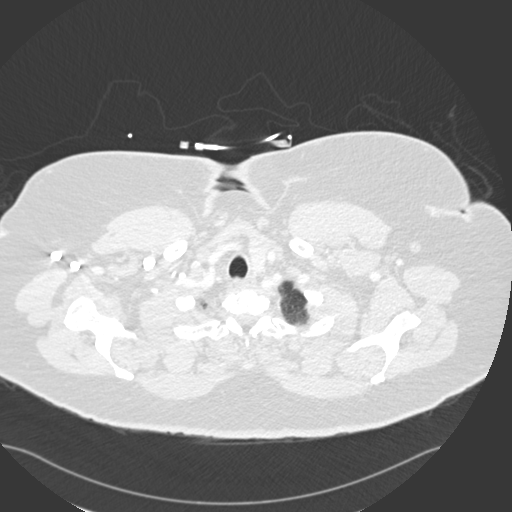
[im 230/258  mediastinal]
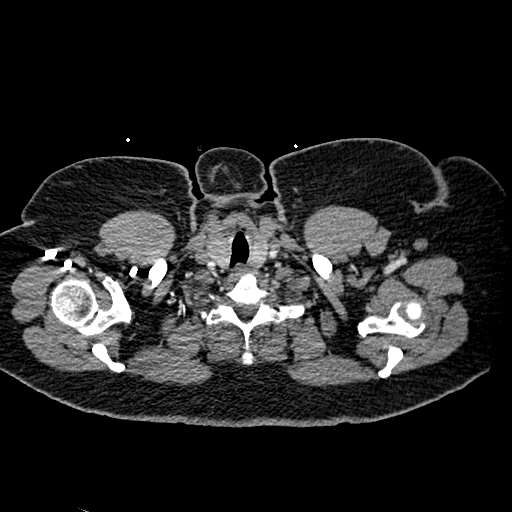
[im 244/258  lung]
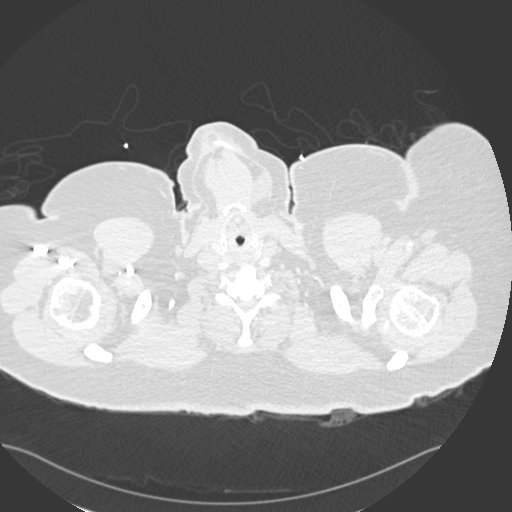

[18 of 36 positions shown; findings below may reference images not displayed]

FINDINGS: There is no evidence of significant pulmonary embolus.

Mild bibasilar atelectasis is noted. The lungs are otherwise clear.
There is no evidence of significant focal consolidation, pleural
effusion or pneumothorax. No masses are identified; no abnormal
focal contrast enhancement is seen.

The mediastinum is unremarkable in appearance. Scattered coronary
artery calcification is noted. No mediastinal lymphadenopathy is
seen. No pericardial effusion is identified. The great vessels are
grossly unremarkable in appearance. No axillary lymphadenopathy is
seen. The visualized portions of the thyroid gland are unremarkable
in appearance.

The visualized portions of the liver and spleen are unremarkable.

No acute osseous abnormalities are seen. Mild degenerative change is
noted along the lower cervical spine.

Review of the MIP images confirms the above findings.
IMPRESSION: 1. No evidence of significant pulmonary embolus.
2. Mild bibasilar atelectasis noted.  Lungs otherwise clear.
3. Scattered coronary artery calcifications seen.

## 2018-02-04 DIAGNOSIS — G4733 Obstructive sleep apnea (adult) (pediatric): Secondary | ICD-10-CM | POA: Diagnosis not present

## 2018-03-06 DIAGNOSIS — G4733 Obstructive sleep apnea (adult) (pediatric): Secondary | ICD-10-CM | POA: Diagnosis not present

## 2021-06-06 ENCOUNTER — Other Ambulatory Visit: Payer: Self-pay

## 2021-06-06 ENCOUNTER — Encounter (HOSPITAL_COMMUNITY): Payer: Self-pay

## 2021-06-06 ENCOUNTER — Emergency Department (HOSPITAL_COMMUNITY): Payer: Medicare HMO

## 2021-06-06 ENCOUNTER — Observation Stay (HOSPITAL_COMMUNITY)
Admission: EM | Admit: 2021-06-06 | Discharge: 2021-06-09 | Disposition: A | Discharge status: Home / Self Care | Payer: Medicare HMO | Attending: Internal Medicine | Admitting: Internal Medicine

## 2021-06-06 DIAGNOSIS — I10 Essential (primary) hypertension: Secondary | ICD-10-CM | POA: Diagnosis not present

## 2021-06-06 DIAGNOSIS — Z8673 Personal history of transient ischemic attack (TIA), and cerebral infarction without residual deficits: Secondary | ICD-10-CM | POA: Insufficient documentation

## 2021-06-06 DIAGNOSIS — D649 Anemia, unspecified: Secondary | ICD-10-CM | POA: Diagnosis not present

## 2021-06-06 DIAGNOSIS — K449 Diaphragmatic hernia without obstruction or gangrene: Secondary | ICD-10-CM | POA: Insufficient documentation

## 2021-06-06 DIAGNOSIS — R001 Bradycardia, unspecified: Secondary | ICD-10-CM | POA: Insufficient documentation

## 2021-06-06 DIAGNOSIS — R112 Nausea with vomiting, unspecified: Secondary | ICD-10-CM | POA: Diagnosis not present

## 2021-06-06 DIAGNOSIS — R55 Syncope and collapse: Secondary | ICD-10-CM | POA: Diagnosis not present

## 2021-06-06 DIAGNOSIS — Z91040 Latex allergy status: Secondary | ICD-10-CM | POA: Diagnosis not present

## 2021-06-06 DIAGNOSIS — R0789 Other chest pain: Secondary | ICD-10-CM | POA: Diagnosis not present

## 2021-06-06 DIAGNOSIS — Z7951 Long term (current) use of inhaled steroids: Secondary | ICD-10-CM | POA: Diagnosis not present

## 2021-06-06 DIAGNOSIS — I2510 Atherosclerotic heart disease of native coronary artery without angina pectoris: Secondary | ICD-10-CM | POA: Insufficient documentation

## 2021-06-06 DIAGNOSIS — R519 Headache, unspecified: Secondary | ICD-10-CM | POA: Diagnosis present

## 2021-06-06 DIAGNOSIS — R202 Paresthesia of skin: Secondary | ICD-10-CM | POA: Insufficient documentation

## 2021-06-06 DIAGNOSIS — Z7902 Long term (current) use of antithrombotics/antiplatelets: Secondary | ICD-10-CM | POA: Diagnosis not present

## 2021-06-06 DIAGNOSIS — K5730 Diverticulosis of large intestine without perforation or abscess without bleeding: Secondary | ICD-10-CM | POA: Diagnosis not present

## 2021-06-06 DIAGNOSIS — J45909 Unspecified asthma, uncomplicated: Secondary | ICD-10-CM | POA: Insufficient documentation

## 2021-06-06 DIAGNOSIS — Z79899 Other long term (current) drug therapy: Secondary | ICD-10-CM | POA: Diagnosis not present

## 2021-06-06 DIAGNOSIS — Z20822 Contact with and (suspected) exposure to covid-19: Secondary | ICD-10-CM | POA: Diagnosis not present

## 2021-06-06 DIAGNOSIS — E782 Mixed hyperlipidemia: Secondary | ICD-10-CM | POA: Diagnosis not present

## 2021-06-06 DIAGNOSIS — J449 Chronic obstructive pulmonary disease, unspecified: Secondary | ICD-10-CM | POA: Diagnosis not present

## 2021-06-06 DIAGNOSIS — R109 Unspecified abdominal pain: Secondary | ICD-10-CM | POA: Diagnosis not present

## 2021-06-06 DIAGNOSIS — G4733 Obstructive sleep apnea (adult) (pediatric): Secondary | ICD-10-CM | POA: Diagnosis present

## 2021-06-06 LAB — CBC WITH DIFFERENTIAL/PLATELET
Abs Immature Granulocytes: 0.02 10*3/uL (ref 0.00–0.07)
Basophils Absolute: 0 10*3/uL (ref 0.0–0.1)
Basophils Relative: 1 %
Eosinophils Absolute: 0.2 10*3/uL (ref 0.0–0.5)
Eosinophils Relative: 3 %
HCT: 37.2 % (ref 36.0–46.0)
Hemoglobin: 12 g/dL (ref 12.0–15.0)
Immature Granulocytes: 0 %
Lymphocytes Relative: 25 %
Lymphs Abs: 1.6 10*3/uL (ref 0.7–4.0)
MCH: 29.3 pg (ref 26.0–34.0)
MCHC: 32.3 g/dL (ref 30.0–36.0)
MCV: 90.7 fL (ref 80.0–100.0)
Monocytes Absolute: 0.6 10*3/uL (ref 0.1–1.0)
Monocytes Relative: 9 %
Neutro Abs: 3.8 10*3/uL (ref 1.7–7.7)
Neutrophils Relative %: 62 %
Platelets: 277 10*3/uL (ref 150–400)
RBC: 4.1 MIL/uL (ref 3.87–5.11)
RDW: 12.5 % (ref 11.5–15.5)
WBC: 6.2 10*3/uL (ref 4.0–10.5)
nRBC: 0 % (ref 0.0–0.2)

## 2021-06-06 LAB — COMPREHENSIVE METABOLIC PANEL
ALT: 25 U/L (ref 0–44)
AST: 38 U/L (ref 15–41)
Albumin: 3.4 g/dL — ABNORMAL LOW (ref 3.5–5.0)
Alkaline Phosphatase: 57 U/L (ref 38–126)
Anion gap: 9 (ref 5–15)
BUN: 16 mg/dL (ref 8–23)
CO2: 29 mmol/L (ref 22–32)
Calcium: 8.6 mg/dL — ABNORMAL LOW (ref 8.9–10.3)
Chloride: 100 mmol/L (ref 98–111)
Creatinine, Ser: 0.98 mg/dL (ref 0.44–1.00)
GFR, Estimated: 59 mL/min — ABNORMAL LOW (ref >60)
Glucose, Bld: 106 mg/dL — ABNORMAL HIGH (ref 70–99)
Potassium: 3.4 mmol/L — ABNORMAL LOW (ref 3.5–5.1)
Sodium: 138 mmol/L (ref 135–145)
Total Bilirubin: 0.6 mg/dL (ref 0.3–1.2)
Total Protein: 7.6 g/dL (ref 6.5–8.1)

## 2021-06-06 LAB — TROPONIN I (HIGH SENSITIVITY)
Troponin I (High Sensitivity): 8 ng/L (ref <18)
Troponin I (High Sensitivity): 11 ng/L (ref <18)

## 2021-06-06 LAB — RESP PANEL BY RT-PCR (FLU A&B, COVID) ARPGX2
Influenza A by PCR: NEGATIVE
Influenza B by PCR: NEGATIVE
SARS Coronavirus 2 by RT PCR: NEGATIVE

## 2021-06-06 LAB — LIPASE, BLOOD: Lipase: 23 U/L (ref 11–51)

## 2021-06-06 MED ORDER — DIPHENHYDRAMINE HCL 50 MG/ML IJ SOLN
25.0000 mg | Freq: Once | INTRAMUSCULAR | Status: AC
  Administered 2021-06-06: 25 mg via INTRAVENOUS
  Filled 2021-06-06: qty 1

## 2021-06-06 MED ORDER — IOHEXOL 350 MG/ML SOLN
75.0000 mL | Freq: Once | INTRAVENOUS | Status: AC | PRN
  Administered 2021-06-06: 22:00:00 75 mL via INTRAVENOUS

## 2021-06-06 MED ORDER — PROCHLORPERAZINE EDISYLATE 10 MG/2ML IJ SOLN
10.0000 mg | Freq: Once | INTRAMUSCULAR | Status: AC
  Administered 2021-06-06: 10 mg via INTRAVENOUS
  Filled 2021-06-06: qty 2

## 2021-06-06 MED ORDER — ONDANSETRON HCL 4 MG/2ML IJ SOLN
4.0000 mg | Freq: Once | INTRAMUSCULAR | Status: AC
  Administered 2021-06-06: 19:00:00 4 mg via INTRAVENOUS
  Filled 2021-06-06: qty 2

## 2021-06-06 NOTE — ED Triage Notes (Addendum)
Per EMS, Pt, from home, c/o sudden onset dizziness, n/v, and headache around 1600.  Pain score 6/10.  Pt reports "my head was just shaking back and forth uncontrollably.  It last a couple minutes and I was yelling for help.  There was a tingling in the side of my head."   ? ?4mg  Zofran given en route. ? ?Pt sts her face was tingling in her chin and moved up her face and into L head.  Tinging has subsided.  ?

## 2021-06-06 NOTE — ED Notes (Signed)
Patient transported to CT 

## 2021-06-06 NOTE — ED Provider Notes (Signed)
[facility] EMERGENCY DEPARTMENT Provider Note   CSN: [id number] Arrival date & time: 06/06/21  1713     History  Chief Complaint  Patient presents with   Dizziness   Emesis   Headache    Autumn Young is a 76 y.o. female.  HPI      76 year old female with a history of asthma, COPD, hypertension, CVA, mild nonobstructive CAD, normal LV function on [facility] 10/2019, who presents with concern for dizziness, nausea and headache.  Was sitting in the chair with the baby, then passed out, when woke up head started moving  Then threw up 3 times, threw up in the ambulance twice Headache from back of head  4PM When vomiting felt fullness in chest, dyspnea WHen laid down on the left side has been having vertigo, world going around and around for one month after had tooth pulled 2 weeks ago Ever since then feeling badly  Having dizziness now, like spinning (this has been going on longer) No change in vision Numbness in left hand started today, tingling left side of face No drooping of the face Was dizzy when got up, felt like walking ok despite dizziness.   Before that totally normal state of health, has been tired a lot, hasn't been able to sleep well, waking up in the middle of the night 130AM-5PM, not sleeping during day, very tired  Reports she flatlined the last time she received contrast, however on chart review it appears that she had had ventricular fibrillation during her cardiac catheterization. They did not list anaphylaxis to contrast or describe this in her notes.   Past Medical History:  Diagnosis Date   Asthma    COPD (chronic obstructive pulmonary disease) (HCC)    GERD (gastroesophageal reflux disease)    Hypertension    Stroke ([place])      Home Medications Prior to Admission medications   Medication Sig Start Date End Date Taking? Authorizing Provider  albuterol (PROVENTIL HFA) 108 (90 Base) MCG/ACT inhaler Inhale 2 puffs into the lungs every 6  (six) hours as needed for wheezing or shortness of breath. 08/13/17   Hongalgi, Lenis Dickinson, MD  albuterol (PROVENTIL) (2.5 MG/3ML) 0.083% nebulizer solution Inhale 3 mLs (2.5 mg total) into the lungs every 4 (four) hours as needed for wheezing or shortness of breath. 08/13/17 08/13/18  Hongalgi, Lenis Dickinson, MD  azithromycin (ZITHROMAX) 250 MG tablet Take 1 tablet (250 mg total) by mouth daily. 08/14/17   Hongalgi, Lenis Dickinson, MD  budesonide-formoterol (SYMBICORT) 160-4.5 MCG/ACT inhaler Inhale 2 puffs into the lungs 2 (two) times daily. 08/13/17 08/13/18  Hongalgi, Lenis Dickinson, MD  chlorthalidone (HYGROTON) 25 MG tablet Take 25 mg by mouth daily.    [provider]  clopidogrel (PLAVIX) 75 MG tablet Take 75 mg by mouth daily. 11/26/15   [provider]  felodipine (PLENDIL) 5 MG 24 hr tablet Take 1 tablet (5 mg total) by mouth daily. 08/14/17   Hongalgi, Lenis Dickinson, MD  losartan (COZAAR) 50 MG tablet Take 50 mg by mouth daily.    [provider]  pantoprazole (PROTONIX) 40 MG tablet Take 40 mg by mouth 2 (two) times daily.  09/27/15   [provider]  predniSONE (DELTASONE) 10 MG tablet Take 3 tabs daily for 2 days, then 2 tabs daily for 2 days, then 1 tab daily for 2 days, then stop. 08/14/17   Hongalgi, Lenis Dickinson, MD  tiotropium (SPIRIVA) 18 MCG inhalation capsule Place 1 capsule (18 mcg  total) into inhaler and inhale daily. 08/14/17   Hongalgi, Lenis Dickinson, MD      Allergies    Simvastatin, Lasix [furosemide], Latex, and Penicillins    Review of Systems   Review of Systems See above  Physical Exam Updated Vital Signs BP (!) 139/55    Pulse 62    Temp 97.6 F (36.4 C) (Oral)    Resp 16    Ht 5\' 6"  (1.676 m)    Wt 136.1 kg    SpO2 91%    BMI 48.42 kg/m  Physical Exam Vitals and nursing note reviewed.  Constitutional:      General: She is not in acute distress.    Appearance: She is well-developed. She is not diaphoretic.  HENT:     Head: Normocephalic and atraumatic.  Eyes:      General: No visual field deficit.    Conjunctiva/sclera: Conjunctivae normal.  Cardiovascular:     Rate and Rhythm: Normal rate and regular rhythm.     Heart sounds: Normal heart sounds. No murmur heard.   No friction rub. No gallop.  Pulmonary:     Effort: Pulmonary effort is normal. No respiratory distress.     Breath sounds: Normal breath sounds. No wheezing or rales.  Abdominal:     General: There is no distension.     Palpations: Abdomen is soft.     Tenderness: There is no abdominal tenderness. There is no guarding.  Musculoskeletal:        General: No tenderness.     Cervical back: Normal range of motion.  Skin:    General: Skin is warm and dry.     Findings: No erythema or rash.  Neurological:     Mental Status: She is alert and oriented to person, place, and time.     GCS: GCS eye subscore is 4. GCS verbal subscore is 5. GCS motor subscore is 6.     Cranial Nerves: No cranial nerve deficit, dysarthria or facial asymmetry.     Sensory: Sensory deficit (left fingers) present.     Motor: No weakness.     Coordination: Coordination normal.     Gait: Gait normal.    ED Results / Procedures / Treatments   Labs (all labs ordered are listed, but only abnormal results are displayed) Labs Reviewed  COMPREHENSIVE METABOLIC PANEL - Abnormal; Notable for the following components:      Result Value   Potassium 3.4 (*)    Glucose, Bld 106 (*)    Calcium 8.6 (*)    Albumin 3.4 (*)    GFR, Estimated 59 (*)    All other components within normal limits  RESP PANEL BY RT-PCR (FLU A&B, COVID) ARPGX2  CBC WITH DIFFERENTIAL/PLATELET  LIPASE, BLOOD  TROPONIN I (HIGH SENSITIVITY)  TROPONIN I (HIGH SENSITIVITY)    EKG EKG Interpretation  Date/Time:  Thursday June 06 2021 17:20:08 EST Ventricular Rate:  59 PR Interval:  188 QRS Duration: 91 QT Interval:  437 QTC Calculation: 433 R Axis:   10 Text Interpretation: Sinus rhythm Low voltage, precordial leads No significant change  since last tracing Confirmed by Gareth Morgan [phone]) on 06/06/2021 5:23:47 PM  Radiology CT ANGIO HEAD NECK W WO CM  Result Date: 06/06/2021 CLINICAL DATA:  Initial evaluation for acute head trauma, syncope. EXAM: CT ANGIOGRAPHY HEAD AND NECK TECHNIQUE: Multidetector CT imaging of the head and neck was performed using the standard protocol during bolus administration of intravenous contrast. Multiplanar CT image reconstructions and MIPs  were obtained to evaluate the vascular anatomy. Carotid stenosis measurements (when applicable) are obtained utilizing NASCET criteria, using the distal internal carotid diameter as the denominator. RADIATION DOSE REDUCTION: This exam was performed according to the departmental dose-optimization program which includes automated exposure control, adjustment of the mA and/or kV according to patient size and/or use of iterative reconstruction technique. CONTRAST:  41mL OMNIPAQUE IOHEXOL 350 MG/ML SOLN COMPARISON:  Prior Study from 11/20/2014. FINDINGS: CT HEAD FINDINGS Brain: Age-related cerebral atrophy with mild chronic small vessel ischemic disease. No acute intracranial hemorrhage. No acute large vessel territory infarct. No mass lesion or midline shift. No hydrocephalus or extra-axial fluid collection. Vascular: No hyperdense vessel. Scattered vascular calcifications noted within the carotid siphons. Skull: Scalp soft tissues within normal limits.  Calvarium intact. Sinuses: Paranasal sinuses are clear. Trace right mastoid effusion noted. Orbits: Globes orbital soft tissues demonstrate no acute finding. Review of the MIP images confirms the above findings CTA NECK FINDINGS Aortic arch: Visualized aortic arch normal in caliber with normal 3 vessel morphology. Mild plaque about the arch and origin of the great vessels without hemodynamically significant stenosis. Right carotid system: Right common and internal carotid arteries mildly tortuous but widely patent without stenosis  or dissection. Left carotid system: Left common and internal carotid arteries are somewhat tortuous but widely patent without stenosis or dissection. Vertebral arteries: Both vertebral arteries arise from the subclavian arteries. No proximal subclavian artery stenosis. Both vertebral arteries widely patent without stenosis, dissection or occlusion. Skeleton: No discrete or worrisome osseous lesions. Moderate spondylosis present at C5-6 and C6-7. Other neck: No other acute soft tissue abnormality within the neck. Upper chest: Visualized upper chest demonstrates no acute finding. Review of the MIP images confirms the above findings CTA HEAD FINDINGS Anterior circulation: Both internal carotid arteries widely patent to the termini without stenosis. A1 segments widely patent. Normal anterior communicating artery complex. Both anterior cerebral arteries widely patent to their distal aspects without stenosis. No M1 stenosis or occlusion. Normal MCA bifurcations. Distal MCA branches well perfused and symmetric. Posterior circulation: Both vertebral arteries patent to the vertebrobasilar junction without stenosis. Both PICA patent at their origins. Basilar patent to its distal aspect without stenosis. Superior cerebral arteries patent bilaterally. Left PCA supplied via the basilar. Fetal type origin of the right PCA. Both PCAs well perfused or distal aspects. Venous sinuses: Patent allowing for timing the contrast bolus. Anatomic variants: Fetal type origin of the right PCA.  No aneurysm. Review of the MIP images confirms the above findings IMPRESSION: 1. Negative CTA of the head and neck. No large vessel occlusion or other acute vascular abnormality. Mild for age atheromatous disease without hemodynamically significant stenosis. 2. Mild diffuse tortuosity of the major arterial vasculature of the head and neck, which can be seen in the setting of chronic underlying hypertension. 3. No other acute intracranial abnormality.  4. Age-related cerebral atrophy with mild chronic small vessel ischemic disease. Electronically Signed   By: Jeannine Boga M.D.   On: 06/06/2021 22:45   DG Chest Portable 1 View  Result Date: 06/06/2021 CLINICAL DATA:  Shortness of breath EXAM: PORTABLE CHEST 1 VIEW COMPARISON:  08/10/2017 FINDINGS: Borderline cardiomegaly. No focal opacity or pleural effusion. No pneumothorax. IMPRESSION: No active disease.  Borderline to mild cardiomegaly Electronically Signed   By: Donavan Foil M.D.   On: 06/06/2021 18:26    Procedures Procedures    Medications Ordered in ED Medications  ondansetron (ZOFRAN) injection 4 mg (4 mg Intravenous Given 06/06/21 1848)  iohexol (OMNIPAQUE) 350 MG/ML injection 75 mL (75 mLs Intravenous Contrast Given 06/06/21 2219)  prochlorperazine (COMPAZINE) injection 10 mg (10 mg Intravenous Given 06/06/21 2336)  diphenhydrAMINE (BENADRYL) injection 25 mg (25 mg Intravenous Given 06/06/21 2335)    ED Course/ Medical Decision Making/ A&P                           Medical Decision Making Amount and/or Complexity of Data Reviewed Labs: ordered. Radiology: ordered.  Risk Prescription drug management.     76 year old female with a history of asthma, COPD, hypertension, CVA, mild nonobstructive CAD, normal LV function on [facility] 10/2019, who presents with concern for syncope, headache, nausea, vomiting, chest discomfort.    DDx includes includes cardiac arrhythmia, SAH, CVA, MI, PE, electrolyte abnormality, hypovolemia including dehydration and anemia/GI bleed, infection.  Do not see significant neurological dysfunction on exam to say risks of tPA would outweigh benefits or see clear signs of stroke on exam, however description of numbness to face and hand that resolved include TIA in differential.  History of her being conscious with head moving back and forth not consistent with seizure. Given concern for headache and dizziness, did order CTA to evaluate for aneurysm, ICH,  occlusion.   She is also describing some chest discomfort "fullness" like her nausea is coming from her chest, ordered troponin, CXR. History is not consistent with aortic dissection, pulses normal bilaterally.  Not having significant dyspnea, no tachycardia or hypoxia and doubt PE.   CTA completed and shows no evidence of aneurysm or LVO.    Labs obtained and interpreted by me without acute significant electrolyte abnormalities, anemia, and troponin negative x2 with low suspicion for ACS.  Will plan on MRI brain WO to evalute for signs of CVA given numbness described to face, left hand, continued left finger numbness as well as headache and vertigo.   Given high risk with syncope, chest discomfort will plan on admission for continued syncope evaluation, MRI to evaluate for signs of stroke/consider TIA.  Given headache cocktail for headache/nausea.           Final Clinical Impression(s) / ED Diagnoses Final diagnoses:  Syncope, unspecified syncope type  Paresthesias  Chest discomfort    Rx / DC Orders ED Discharge Orders     None         Gareth Morgan, MD 06/07/21 0030

## 2021-06-07 ENCOUNTER — Observation Stay (HOSPITAL_COMMUNITY): Payer: Medicare HMO

## 2021-06-07 ENCOUNTER — Observation Stay (HOSPITAL_BASED_OUTPATIENT_CLINIC_OR_DEPARTMENT_OTHER): Payer: Medicare HMO

## 2021-06-07 DIAGNOSIS — J449 Chronic obstructive pulmonary disease, unspecified: Secondary | ICD-10-CM

## 2021-06-07 DIAGNOSIS — R202 Paresthesia of skin: Secondary | ICD-10-CM | POA: Diagnosis not present

## 2021-06-07 DIAGNOSIS — R55 Syncope and collapse: Secondary | ICD-10-CM

## 2021-06-07 DIAGNOSIS — E782 Mixed hyperlipidemia: Secondary | ICD-10-CM

## 2021-06-07 DIAGNOSIS — Z8673 Personal history of transient ischemic attack (TIA), and cerebral infarction without residual deficits: Secondary | ICD-10-CM | POA: Diagnosis not present

## 2021-06-07 DIAGNOSIS — I10 Essential (primary) hypertension: Secondary | ICD-10-CM | POA: Diagnosis not present

## 2021-06-07 DIAGNOSIS — G4733 Obstructive sleep apnea (adult) (pediatric): Secondary | ICD-10-CM

## 2021-06-07 LAB — BASIC METABOLIC PANEL
Anion gap: 9 (ref 5–15)
BUN: 13 mg/dL (ref 8–23)
CO2: 29 mmol/L (ref 22–32)
Calcium: 8.4 mg/dL — ABNORMAL LOW (ref 8.9–10.3)
Chloride: 98 mmol/L (ref 98–111)
Creatinine, Ser: 0.95 mg/dL (ref 0.44–1.00)
GFR, Estimated: >60 mL/min (ref >60)
Glucose, Bld: 101 mg/dL — ABNORMAL HIGH (ref 70–99)
Potassium: 3.1 mmol/L — ABNORMAL LOW (ref 3.5–5.1)
Sodium: 136 mmol/L (ref 135–145)

## 2021-06-07 LAB — LIPID PANEL
Cholesterol: 197 mg/dL (ref 0–200)
HDL: 37 mg/dL — ABNORMAL LOW (ref >40)
LDL Cholesterol: 129 mg/dL — ABNORMAL HIGH (ref 0–99)
Total CHOL/HDL Ratio: 5.3 RATIO
Triglycerides: 153 mg/dL — ABNORMAL HIGH (ref <150)
VLDL: 31 mg/dL (ref 0–40)

## 2021-06-07 LAB — FOLATE: Folate: 8.8 ng/mL (ref >5.9)

## 2021-06-07 LAB — VITAMIN B12: Vitamin B-12: 217 pg/mL (ref 180–914)

## 2021-06-07 LAB — ECHOCARDIOGRAM COMPLETE
AR max vel: 2.4 cm2
AV Peak grad: 19.8 mmHg
Ao pk vel: 2.23 m/s
Area-P 1/2: 2.49 cm2
Height: 66 in
S' Lateral: 2.7 cm
Weight: 4800 oz

## 2021-06-07 LAB — HEMOGLOBIN A1C
Hgb A1c MFr Bld: 5.8 % — ABNORMAL HIGH (ref 4.8–5.6)
Mean Plasma Glucose: 119.76 mg/dL

## 2021-06-07 LAB — T4, FREE: Free T4: 0.75 ng/dL (ref 0.61–1.12)

## 2021-06-07 LAB — TSH: TSH: 10.269 u[IU]/mL — ABNORMAL HIGH (ref 0.350–4.500)

## 2021-06-07 MED ORDER — POTASSIUM CHLORIDE CRYS ER 20 MEQ PO TBCR
40.0000 meq | EXTENDED_RELEASE_TABLET | Freq: Two times a day (BID) | ORAL | Status: AC
  Administered 2021-06-07 (×2): 40 meq via ORAL
  Filled 2021-06-07 (×2): qty 2

## 2021-06-07 MED ORDER — ENOXAPARIN SODIUM 40 MG/0.4ML IJ SOSY
40.0000 mg | PREFILLED_SYRINGE | INTRAMUSCULAR | Status: DC
  Administered 2021-06-07 – 2021-06-09 (×3): 40 mg via SUBCUTANEOUS
  Filled 2021-06-07 (×3): qty 0.4

## 2021-06-07 MED ORDER — ONDANSETRON HCL 4 MG/2ML IJ SOLN: 4.0000 mg | Freq: Four times a day (QID) | INTRAMUSCULAR | Status: DC | PRN

## 2021-06-07 MED ORDER — VITAMIN B-12 1000 MCG PO TABS
1000.0000 ug | ORAL_TABLET | Freq: Every day | ORAL | Status: DC
  Administered 2021-06-08 – 2021-06-09 (×2): 1000 ug via ORAL
  Filled 2021-06-07 (×2): qty 1

## 2021-06-07 MED ORDER — CLOPIDOGREL BISULFATE 75 MG PO TABS
75.0000 mg | ORAL_TABLET | Freq: Every day | ORAL | Status: DC
  Administered 2021-06-07 – 2021-06-09 (×3): 75 mg via ORAL
  Filled 2021-06-07 (×3): qty 1

## 2021-06-07 MED ORDER — CYANOCOBALAMIN 1000 MCG/ML IJ SOLN
1000.0000 ug | Freq: Once | INTRAMUSCULAR | Status: AC
  Administered 2021-06-07: 1000 ug via INTRAMUSCULAR
  Filled 2021-06-07: qty 1

## 2021-06-07 MED ORDER — IOHEXOL 300 MG/ML  SOLN
100.0000 mL | Freq: Once | INTRAMUSCULAR | Status: AC | PRN
  Administered 2021-06-07: 100 mL via INTRAVENOUS

## 2021-06-07 MED ORDER — ACETAMINOPHEN 325 MG PO TABS
650.0000 mg | ORAL_TABLET | ORAL | Status: DC | PRN
Start: 2021-06-07 — End: 2021-06-09
  Administered 2021-06-07 (×2): 650 mg via ORAL
  Filled 2021-06-07 (×2): qty 2

## 2021-06-07 NOTE — Assessment & Plan Note (Signed)
Continue antihypertensive when med rec is complete ?

## 2021-06-07 NOTE — Progress Notes (Signed)
EEG complete - results pending 

## 2021-06-07 NOTE — Procedures (Signed)
Patient Name: Autumn Young  ?MRN: [id number]  ?Epilepsy Attending: Charlsie Quest  ?Referring Physician/Provider: Anselm Jungling, DO ?Date: 06/07/2021  ?Duration: 22.42 mins ? ?Patient history: 76 year old female who presented with syncope with loss of consciousness, head jerking, left-sided paresthesias and weakness.  EEG to evaluate for seizure. ? ?Level of alertness: Awake, asleep ? ?AEDs during EEG study: None ? ?Technical aspects: This EEG study was done with scalp electrodes positioned according to the 10-20 International system of electrode placement. Electrical activity was acquired at a sampling rate of 500Hz  and reviewed with a high frequency filter of 70Hz  and a low frequency filter of 1Hz . EEG data were recorded continuously and digitally stored.  ? ?Description: The posterior dominant rhythm consists of 8-9 Hz activity of moderate voltage (25-35 uV) seen predominantly in posterior head regions, symmetric and reactive to eye opening and eye closing. Sleep was characterized by vertex waves, sleep spindles (12 to 14 Hz), maximal frontocentral region. Hyperventilation and photic stimulation were not performed.    ? ?IMPRESSION: ?This study is within normal limits. No seizures or epileptiform discharges were seen throughout the recording. ? ?  ? ?

## 2021-06-07 NOTE — Assessment & Plan Note (Signed)
Has declined statin in the past.  °

## 2021-06-07 NOTE — Progress Notes (Signed)
Echocardiogram ?2D Echocardiogram has been performed. ? ?[name] ?06/07/2021, 10:05 AM ?

## 2021-06-07 NOTE — H&P (Signed)
?History and Physical  ? ? ?Patient: Autumn Young:[id number] DOB: [birth year] ?DOA: 06/06/2021 ?DOS: the patient was seen and examined on 06/07/2021 ?PCP: Patient, No Pcp Per (Inactive)  ?Patient coming from: Home ? ?Chief Complaint:  ?Chief Complaint  ?Patient presents with  ? Dizziness  ? Emesis  ? Headache  ? ?HPI: Autumn Young is a 75 y.o. female with medical history significant of CVA on Plavix, CAD, hypertension, pulmonary hypertension, COPD, asthma, OSA not on CPAP who presents with concerns of syncope. ? ?Patient says that she was sitting in a chair today holding her 25-year-old around 4pm when she suddenly had loss of consciousness.  States she woke up feeling like her head was moving from side to side and had an occipital headache.  Felt left lower extremity weakness and tingling of her left hand up to the left side of her face.  Denies any vision changes.  States she heard herself calling for help to her husband in the other room.  She then laid herself on the floor.  Felt like her heart was racing.  She is unsure whether she had urinary or bowel incontinence with the episode.  However, she had an episode of diarrhea here in the ED.  She also reports feeling ill for the past month with nausea with intermittent vomiting and subjective fever.  No significant weight loss and has intentionally been trying to lose weight. ? ?In the ED, she was afebrile and normotensive and bradycardic in the 50s at times on room air.  CBC shows no leukocytosis or anemia.  BMP otherwise unremarkable.  Troponin of 11.  EKG showing normal sinus rhythm. ?Chest x-ray was negative.  Negative flu and COVID PCR. ? ?CT head and CTA of the head and neck was obtained with no large vessel occlusion or other vascular abnormalities.  ? ? ?Review of Systems: As mentioned in the history of present illness. All other systems reviewed and are negative. ?Past Medical History:  ?Diagnosis Date  ? Asthma   ? COPD (chronic obstructive pulmonary  disease) (HCC)   ? GERD (gastroesophageal reflux disease)   ? Hypertension   ? Stroke [facility])   ? ?Past Surgical History:  ?Procedure Laterality Date  ? TUBAL LIGATION    ? ?Social History:  reports that she has never smoked. She has never used smokeless tobacco. She reports that she does not drink alcohol and does not use drugs. ? ?Allergies  ?Allergen Reactions  ? Shellfish Allergy Anaphylaxis  ? Simvastatin Swelling  ? Lasix [Furosemide] Swelling  ? Latex   ?  rash  ? Penicillins Rash  ?  Has patient had a PCN reaction causing immediate rash, facial/tongue/throat swelling, SOB or lightheadedness with hypotension: Unknown ?Has patient had a PCN reaction causing severe rash involving mucus membranes or skin necrosis: No ?Has patient had a PCN reaction that required hospitalization: No ?Has patient had a PCN reaction occurring within the last 10 years: No ?If all of the above answers are "NO", then may proceed with Cephalosporin use. ?  ? ? ?Family History  ?Problem Relation Age of Onset  ? Cancer Mother   ? Hypertension Father   ? Cancer Sister   ? Diabetes Sister   ? Hypertension Sister   ? Heart disease Sister   ? Cancer Brother   ? Hypertension Brother   ? ? ?Prior to Admission medications   ?Medication Sig Start Date End Date Taking? Authorizing Provider  ?albuterol (PROVENTIL HFA) 108 (90 Base) MCG/ACT  inhaler Inhale 2 puffs into the lungs every 6 (six) hours as needed for wheezing or shortness of breath. 08/13/17   Hongalgi, Maximino GreenlandAnand D, MD  ?albuterol (PROVENTIL) (2.5 MG/3ML) 0.083% nebulizer solution Inhale 3 mLs (2.5 mg total) into the lungs every 4 (four) hours as needed for wheezing or shortness of breath. 08/13/17 08/13/18  Hongalgi, Maximino GreenlandAnand D, MD  ?azithromycin (ZITHROMAX) 250 MG tablet Take 1 tablet (250 mg total) by mouth daily. 08/14/17   Hongalgi, Maximino GreenlandAnand D, MD  ?budesonide-formoterol (SYMBICORT) 160-4.5 MCG/ACT inhaler Inhale 2 puffs into the lungs 2 (two) times daily. 08/13/17 08/13/18  Hongalgi, Maximino GreenlandAnand D, MD   ?chlorthalidone (HYGROTON) 25 MG tablet Take 25 mg by mouth daily.    [provider]  ?clopidogrel (PLAVIX) 75 MG tablet Take 75 mg by mouth daily. 11/26/15   [provider]  ?felodipine (PLENDIL) 5 MG 24 hr tablet Take 1 tablet (5 mg total) by mouth daily. 08/14/17   Hongalgi, Maximino GreenlandAnand D, MD  ?losartan (COZAAR) 50 MG tablet Take 50 mg by mouth daily.    [provider]  ?pantoprazole (PROTONIX) 40 MG tablet Take 40 mg by mouth 2 (two) times daily.  09/27/15   [provider]  ?predniSONE (DELTASONE) 10 MG tablet Take 3 tabs daily for 2 days, then 2 tabs daily for 2 days, then 1 tab daily for 2 days, then stop. 08/14/17   Hongalgi, Maximino GreenlandAnand D, MD  ?tiotropium (SPIRIVA) 18 MCG inhalation capsule Place 1 capsule (18 mcg total) into inhaler and inhale daily. 08/14/17   Hongalgi, Maximino GreenlandAnand D, MD  ? ? ?Physical Exam: ?Vitals:  ? 06/06/21 2130 06/06/21 2220 06/06/21 2300 06/07/21 0000  ?BP: (!) 135/55 (!) 143/62 (!) 144/59 (!) 139/55  ?Pulse: (!) 56 67 (!) 58 62  ?Resp: 16 (!) 31 15 16   ?Temp:      ?TempSrc:      ?SpO2: 93% 97% 91% 91%  ?Weight:      ?Height:      ? ?Constitutional: NAD, calm, comfortable, fatigue appearing obese elderly female laying asleep in bed ?Eyes: lids and conjunctivae normal ?ENMT: Mucous membranes are moist.  ?Neck: normal, supple ?Respiratory: clear to auscultation bilaterally, no wheezing, no crackles. Normal respiratory effort. No accessory muscle use.  ?Cardiovascular: Regular rate and rhythm, no murmurs / rubs / gallops. No extremity edema.  ?Abdomen: no tenderness,  Bowel sounds positive.  ?Musculoskeletal: no clubbing / cyanosis. No joint deformity upper and lower extremities. Good ROM, no contractures. Normal muscle tone.  ?Skin: no rashes, lesions, ulcers.  ?Neurologic: CN 2-12 grossly intact.No facial asymmetry. No nystagmus. Symmetric Sensation intact, Strength 5/5 in all 4.  ?Psychiatric: Normal judgment and insight. Alert and oriented x 3. Normal mood. ?Data  Reviewed: ? ?See HPI ? ?Assessment and Plan: ?Syncope ?-presented with episode of questionable syncope with LOC with head jerking, headache, left sided paresthesia and weakness.Differential includes complex migraine, CVA/TIA, seizure, vitamin deficiency ?-CT head and CTA head and neck negative for LVO. MRI brain pending  ?-obtain Echocardiogram ?-obtain Vitamin B12, folic, TSH- has had decrease appetite with nausea for past month  ?-obtain EEG  ?-Consider Neurology consult in the morning ?-continue home Plavix  ? ?History of CVA (cerebrovascular accident) ?Continue Plavix. DAPT d/c by cardiology in 2019. ? ?OSA (obstructive sleep apnea) ?Not compliant with CPAP ? ?Mixed hyperlipidemia ?Has declined statin in the past  ? ?Essential hypertension ?Continue antihypertensive when med rec is complete ? ?COPD (chronic obstructive pulmonary disease) with chronic bronchitis (HCC) ?Continue home bronchodilator  once med rec is complete ? ? ? ? ? Advance Care Planning:   Code Status: Full Code  ? ?Consults: none ? ?Family Communication: no family at bedside ? ?Severity of Illness: ?The appropriate patient status for this patient is OBSERVATION. Observation status is judged to be reasonable and necessary in order to provide the required intensity of service to ensure the patient's safety. The patient's presenting symptoms, physical exam findings, and initial radiographic and laboratory data in the context of their medical condition is felt to place them at decreased risk for further clinical deterioration. Furthermore, it is anticipated that the patient will be medically stable for discharge from the hospital within 2 midnights of admission.  ? ?Author: Anselm Jungling, DO ?06/07/2021 2:20 AM ? ?For on call review [email or web address].  ?

## 2021-06-07 NOTE — Assessment & Plan Note (Signed)
Not compliant with CPAP 

## 2021-06-07 NOTE — Progress Notes (Signed)
Autumn Young is a 76 y.o. female with medical history significant of CVA on Plavix, CAD, hypertension, pulmonary hypertension, COPD, asthma, OSA not on CPAP who presents with concerns of syncope. ?Pt reports that she had her 5 year grand child in her lap, felt nauseated and started throwing up , started shaking her head and reports that she was going in and out of consciousness.  ?She also reported some nausea, vomiting and diarrhea.  ?She was admitted for evaluation of syncope.  ?Stroke work up in progress.  ?Monitor on telemetry.  ?Check CT abdomen and pelvis. ? ? ?Kathlen Mody, MD ?

## 2021-06-07 NOTE — Assessment & Plan Note (Signed)
-  presented with episode of questionable syncope with LOC with head jerking, headache, left sided paresthesia and weakness.Differential includes complex migraine, CVA/TIA, seizure, vitamin deficiency ?-CT head and CTA head and neck negative for LVO. MRI brain pending  ?-obtain Echocardiogram ?-obtain Vitamin B12, folic, TSH- has had decrease appetite with nausea for past month  ?-obtain EEG  ?-Consider Neurology consult in the morning ?-continue home Plavix  ?

## 2021-06-07 NOTE — Assessment & Plan Note (Addendum)
Continue home bronchodilator once med rec is complete ?

## 2021-06-07 NOTE — ED Notes (Signed)
Pt to MRI

## 2021-06-07 NOTE — Assessment & Plan Note (Signed)
Continue Plavix. DAPT d/c by cardiology in 2019. ?

## 2021-06-07 NOTE — ED Notes (Signed)
EEG tech at bedside. 

## 2021-06-08 ENCOUNTER — Observation Stay (HOSPITAL_BASED_OUTPATIENT_CLINIC_OR_DEPARTMENT_OTHER): Payer: Medicare HMO

## 2021-06-08 DIAGNOSIS — R202 Paresthesia of skin: Secondary | ICD-10-CM | POA: Diagnosis not present

## 2021-06-08 DIAGNOSIS — I10 Essential (primary) hypertension: Secondary | ICD-10-CM | POA: Diagnosis not present

## 2021-06-08 DIAGNOSIS — J449 Chronic obstructive pulmonary disease, unspecified: Secondary | ICD-10-CM | POA: Diagnosis not present

## 2021-06-08 DIAGNOSIS — R55 Syncope and collapse: Secondary | ICD-10-CM | POA: Diagnosis not present

## 2021-06-08 DIAGNOSIS — I441 Atrioventricular block, second degree: Secondary | ICD-10-CM

## 2021-06-08 DIAGNOSIS — Z8673 Personal history of transient ischemic attack (TIA), and cerebral infarction without residual deficits: Secondary | ICD-10-CM | POA: Diagnosis not present

## 2021-06-08 DIAGNOSIS — R531 Weakness: Secondary | ICD-10-CM

## 2021-06-08 LAB — CBC WITH DIFFERENTIAL/PLATELET
Abs Immature Granulocytes: 0.01 10*3/uL (ref 0.00–0.07)
Basophils Absolute: 0 10*3/uL (ref 0.0–0.1)
Basophils Relative: 1 %
Eosinophils Absolute: 0.2 10*3/uL (ref 0.0–0.5)
Eosinophils Relative: 5 %
HCT: 35.8 % — ABNORMAL LOW (ref 36.0–46.0)
Hemoglobin: 11.6 g/dL — ABNORMAL LOW (ref 12.0–15.0)
Immature Granulocytes: 0 %
Lymphocytes Relative: 40 %
Lymphs Abs: 1.7 10*3/uL (ref 0.7–4.0)
MCH: 29.4 pg (ref 26.0–34.0)
MCHC: 32.4 g/dL (ref 30.0–36.0)
MCV: 90.6 fL (ref 80.0–100.0)
Monocytes Absolute: 0.5 10*3/uL (ref 0.1–1.0)
Monocytes Relative: 11 %
Neutro Abs: 1.9 10*3/uL (ref 1.7–7.7)
Neutrophils Relative %: 43 %
Platelets: 260 10*3/uL (ref 150–400)
RBC: 3.95 MIL/uL (ref 3.87–5.11)
RDW: 12.4 % (ref 11.5–15.5)
WBC: 4.3 10*3/uL (ref 4.0–10.5)
nRBC: 0 % (ref 0.0–0.2)

## 2021-06-08 LAB — BASIC METABOLIC PANEL
Anion gap: 7 (ref 5–15)
BUN: 14 mg/dL (ref 8–23)
CO2: 30 mmol/L (ref 22–32)
Calcium: 8.5 mg/dL — ABNORMAL LOW (ref 8.9–10.3)
Chloride: 99 mmol/L (ref 98–111)
Creatinine, Ser: 1.08 mg/dL — ABNORMAL HIGH (ref 0.44–1.00)
GFR, Estimated: 53 mL/min — ABNORMAL LOW (ref >60)
Glucose, Bld: 118 mg/dL — ABNORMAL HIGH (ref 70–99)
Potassium: 3.5 mmol/L (ref 3.5–5.1)
Sodium: 136 mmol/L (ref 135–145)

## 2021-06-08 LAB — T3, FREE: T3, Free: 2.3 pg/mL (ref 2.0–4.4)

## 2021-06-08 MED ORDER — FLUTICASONE FUROATE-VILANTEROL 200-25 MCG/ACT IN AEPB
1.0000 | INHALATION_SPRAY | Freq: Every day | RESPIRATORY_TRACT | Status: DC
  Administered 2021-06-08 – 2021-06-09 (×2): 1 via RESPIRATORY_TRACT
  Filled 2021-06-08: qty 28

## 2021-06-08 MED ORDER — ALBUTEROL SULFATE (2.5 MG/3ML) 0.083% IN NEBU: 3.0000 mL | INHALATION_SOLUTION | Freq: Four times a day (QID) | RESPIRATORY_TRACT | Status: DC | PRN

## 2021-06-08 MED ORDER — PANTOPRAZOLE SODIUM 40 MG PO TBEC
40.0000 mg | DELAYED_RELEASE_TABLET | Freq: Two times a day (BID) | ORAL | Status: DC
  Administered 2021-06-08 – 2021-06-09 (×2): 40 mg via ORAL
  Filled 2021-06-08 (×2): qty 1

## 2021-06-08 MED ORDER — TIOTROPIUM BROMIDE MONOHYDRATE 1.25 MCG/ACT IN AERS: 2.0000 | INHALATION_SPRAY | Freq: Every day | RESPIRATORY_TRACT | Status: DC

## 2021-06-08 MED ORDER — UMECLIDINIUM BROMIDE 62.5 MCG/ACT IN AEPB
1.0000 | INHALATION_SPRAY | Freq: Every day | RESPIRATORY_TRACT | Status: DC
  Administered 2021-06-09: 1 via RESPIRATORY_TRACT
  Filled 2021-06-08: qty 7

## 2021-06-08 MED ORDER — MONTELUKAST SODIUM 10 MG PO TABS
10.0000 mg | ORAL_TABLET | Freq: Every day | ORAL | Status: DC
  Administered 2021-06-08: 10 mg via ORAL
  Filled 2021-06-08: qty 1

## 2021-06-08 NOTE — Plan of Care (Signed)
?  Problem: Education: Goal: Knowledge of General Education information will improve Description: Including pain rating scale, medication(s)/side effects and non-pharmacologic comfort measures Outcome: Completed/Met   Problem: Health Behavior/Discharge Planning: Goal: Ability to manage health-related needs will improve Outcome: Completed/Met   

## 2021-06-08 NOTE — Evaluation (Signed)
Physical Therapy Evaluation & Discharge ?Patient Details ?Name: Autumn Young ?MRN: [id number] ?DOB: June 26, 1945 ?Today's Date: 06/08/2021 ? ?History of Present Illness ? 76 y/o female presented to ED on 06/06/21 for syncopal episode. MRI and CT negative. EEG negative. PMH: CVA, CAD, HTN, pulmonary HTN, COPD, asthma, OSA.  ?Clinical Impression ? Patient admitted with above. Patient functioning at modI level for mobility which is patient's baseline. Patient does demo decreased activity tolerance. Recommend rollator at discharge for energy conservation and support. No further skilled PT needs required acutely. No PT follow up recommended at this time.    ?   ? ?Recommendations for follow up therapy are one component of a multi-disciplinary discharge planning process, led by the attending physician.  Recommendations may be updated based on patient status, additional functional criteria and insurance authorization. ? ?Follow Up Recommendations No PT follow up ? ?  ?Assistance Recommended at Discharge PRN  ?Patient can return home with the following ?   ? ?  ?Equipment Recommendations Rollator (4 wheels)  ?Recommendations for Other Services ?    ?  ?Functional Status Assessment Patient has had a recent decline in their functional status and demonstrates the ability to make significant improvements in function in a reasonable and predictable amount of time.  ? ?  ?Precautions / Restrictions Precautions ?Precautions: Fall ?Restrictions ?Weight Bearing Restrictions: No  ? ?  ? ?Mobility ? Bed Mobility ?Overal bed mobility: Modified Independent ?  ?  ?  ?  ?  ?  ?  ?  ? ?Transfers ?Overall transfer level: Modified independent ?Equipment used: None ?  ?  ?  ?  ?  ?  ?  ?  ?  ? ?Ambulation/Gait ?Ambulation/Gait assistance: Modified independent (Device/Increase time) ?Gait Distance (Feet): 150 Feet ?Assistive device: None ?Gait Pattern/deviations: WFL(Within Functional Limits) ?  ?Gait velocity interpretation: >2.62 ft/sec,  indicative of community ambulatory ?  ?  ? ?Stairs ?Stairs: Yes ?Stairs assistance: Modified independent (Device/Increase time) ?Stair Management: One rail Left, Step to pattern, Forwards ?Number of Stairs: 2 ?  ? ?Wheelchair Mobility ?  ? ?Modified Rankin (Stroke Patients Only) ?  ? ?  ? ?Balance Overall balance assessment: Mild deficits observed, not formally tested ?  ?  ?  ?  ?  ?  ?  ?  ?  ?  ?  ?  ?  ?  ?  ?  ?  ?  ?   ? ? ? ?Pertinent Vitals/Pain Pain Assessment ?Pain Assessment: No/denies pain  ? ? ?Home Living Family/patient expects to be discharged to:: Private residence ?Living Arrangements: Non-relatives/Friends;Other relatives (great granddaughter (4)) ?Available Help at Discharge: Friend(s) ?Type of Home: House ?Home Access: Stairs to enter ?Entrance Stairs-Rails: None ?Entrance Stairs-Number of Steps: 3-4 ?  ?Home Layout: One level ?Home Equipment: Kasandra Knudsen - single point ?   ?  ?Prior Function Prior Level of Function : Independent/Modified Independent ?  ?  ?  ?  ?  ?  ?Mobility Comments: uses cane intermittently ?  ?  ? ? ?Hand Dominance  ?   ? ?  ?Extremity/Trunk Assessment  ? Upper Extremity Assessment ?Upper Extremity Assessment: Overall WFL for tasks assessed ?  ? ?Lower Extremity Assessment ?Lower Extremity Assessment: Generalized weakness (complains of L LE numbness with prolonged ambulation) ?  ? ?Cervical / Trunk Assessment ?Cervical / Trunk Assessment: Kyphotic  ?Communication  ? Communication: No difficulties  ?Cognition Arousal/Alertness: Awake/alert ?Behavior During Therapy: [facility] for tasks assessed/performed ?Overall Cognitive Status: Within Functional Limits for  tasks assessed ?  ?  ?  ?  ?  ?  ?  ?  ?  ?  ?  ?  ?  ?  ?  ?  ?  ?  ?  ? ?  ?General Comments   ? ?  ?Exercises    ? ?Assessment/Plan  ?  ?PT Assessment Patient does not need any further PT services  ?PT Problem List   ? ?   ?  ?PT Treatment Interventions     ? ?PT Goals (Current goals can be found in the Care Plan section)  ?Acute  Rehab PT Goals ?Patient Stated Goal: to go home to my grandbaby ?PT Goal Formulation: All assessment and education complete, DC therapy ? ?  ?Frequency   ?  ? ? ?Co-evaluation   ?  ?  ?  ?  ? ? ?  ?AM-PAC PT "6 Clicks" Mobility  ?Outcome Measure Help needed turning from your back to your side while in a flat bed without using bedrails?: None ?Help needed moving from lying on your back to sitting on the side of a flat bed without using bedrails?: None ?Help needed moving to and from a bed to a chair (including a wheelchair)?: None ?Help needed standing up from a chair using your arms (e.g., wheelchair or bedside chair)?: None ?Help needed to walk in hospital room?: None ?Help needed climbing 3-5 steps with a railing? : None ?6 Click Score: 24 ? ?  ?End of Session   ?Activity Tolerance: Patient tolerated treatment well ?Patient left: in chair;with call bell/phone within reach ?Nurse Communication: Mobility status ?PT Visit Diagnosis: Unsteadiness on feet (R26.81) ?  ? ?Time: [id number] ?PT Time Calculation (min) (ACUTE ONLY): 12 min ? ? ?Charges:   PT Evaluation ?$PT Eval Low Complexity: 1 Low ?  ?  ?   ? ? ?Sadye Kiernan A. Gilford Rile, PT, DPT ?Acute Rehabilitation Services ?Pager [phone] ?Office [phone] ? ? ?Mellisa Arshad A Tacarra Justo ?06/08/2021, 4:24 PM ? ?

## 2021-06-08 NOTE — Progress Notes (Signed)
VASCULAR LAB ? ? ? ?Carotid duplex has been performed. ? ?See CV proc for preliminary results. ? ? ?Izaak Sahr, RVT ?06/08/2021, 2:45 PM ? ?

## 2021-06-08 NOTE — Progress Notes (Signed)
[facility]                                                                               Autumn Young, is a 76 y.o. female, DOB - [birth year], ZOX:[id number] Admit date - 06/06/2021    Outpatient Primary MD for the patient is Patient, No Pcp Per (Inactive)  LOS - 0  days    Brief summary    Autumn Young is a 76 y.o. female with medical history significant of CVA on Plavix, CAD, hypertension, pulmonary hypertension, COPD, asthma, OSA not on CPAP who presents with concerns of syncope. Pt reports that she had her 5 year grand child in her lap, felt nauseated and started throwing up , started shaking her head and reports that she was going in and out of consciousness.   Assessment & Plan    Assessment and Plan: Syncope -presented with episode of questionable syncope  with loss of consciousness in and out , with head shaking. Differential includes complex migraine, CVA/TIA, seizure, vitamin deficiency -CT head and CTA head and neck negative for LVO. MRI brain is negative for acute CVA.  -Echocardiogram shows preserved LVEF, normal function, no regional wall abn, grade 2 diastolic dysf.  -vit b12 normal low, supplementation added.  Eeg is negative.  -continue home Plavix  - overnight telemetry monitor show bradycardia with some non conducting p waves/vs second degree heart block.  - TSH elevated, but free T4 and T3 are wnl.  - hemoglobin A1c is 5.8% - lipid panel shows LDL of 129 - cardiology consulted for further evaluation of syncope.  - she reports her head shaking has worsened in the last 3 years, has h/o Parkinson's disease in her father and uncles.she also family history of seizure.  - she hasn't seen a PCP in over 4 years.  She will need TOC consult for PCP and ambulatory referral to neurology for her head shaking.   History of CVA (cerebrovascular accident) Continue Plavix. DAPT d/c by cardiology in 2019.  OSA (obstructive sleep apnea) Not compliant with  CPAP  Mixed hyperlipidemia Not on statin. Pt declined in the past.   Essential hypertension BP off meds optimal.  At home on norvasc, chlorthalidone and benicar.  Will d/c norvasc and chlorthalidone for now.   COPD (chronic obstructive pulmonary disease) with chronic bronchitis (HCC) No wheezing heard.    Bradycardia:  Not on any B blockers.   Hypokalemia:  Replaced.    Mild normocytic anemia:  Monitor.     Estimated body mass index is 48.42 kg/m as calculated from the following:   Height as of this encounter:  (1.676 m).   Weight as of this encounter: 136.1 kg.  Code Status: full code.  DVT Prophylaxis:  enoxaparin (LOVENOX) injection 40 mg Start: 06/07/21 1000   Level of Care: Level of care: Telemetry Medical Family Communication: none at bedside.   Disposition Plan:     Remains inpatient appropriate:  further wor up for syncope.   Procedures:  Echo Carotid dupelx EEG MRI brain.   Consultants:   cardiology  Antimicrobials:   Anti-infectives (From admission, onward)    None  Medications  Scheduled Meds:  clopidogrel  75 mg Oral Daily   enoxaparin (LOVENOX) injection  40 mg Subcutaneous Q24H   vitamin B-12  1,000 mcg Oral Daily   Continuous Infusions: PRN Meds:.acetaminophen, ondansetron (ZOFRAN) IV    Subjective:   Autumn Young was seen and examined today.  Pt reports feeling much better today. No nausea or vomiting.   Objective:   Vitals:   06/07/21 1522 06/07/21 2044 06/08/21 0459 06/08/21 0900  BP: (!) 142/60 (!) 141/51 (!) 125/58 132/70  Pulse: (!) 57 (!) 53 (!) 52 (!) 53  Resp: Temp: 98.4 F (36.9 C) 98 F (36.7 C) 97.7 F (36.5 C) 98.1 F (36.7 C)  TempSrc:      SpO2: 97% 94% 91% 93%  Weight:      Height:        Intake/Output Summary (Last 24 hours) at 06/08/2021 1503 Last data filed at 06/08/2021 0900 Gross per 24 hour  Intake 540 ml  Output 0 ml  Net 540 ml   Filed Weights   06/06/21  1721  Weight: 136.1 kg     Exam General: Alert and oriented x 3, NAD Cardiovascular: S1 S2 auscultated, no murmurs, brady cardia.  Respiratory: Clear to auscultation bilaterally, no wheezing, rales or rhonchi Gastrointestinal: Soft, nontender, nondistended, + bowel sounds Ext: no pedal edema bilaterally Neuro: AAOx3, Cr N's II- XII. Strength 5/5 upper and lower extremities bilaterally Skin: No rashes Psych: Normal affect and demeanor, alert and oriented x3    Data Reviewed:  I have personally reviewed following labs and imaging studies   CBC Lab Results  Component Value Date   WBC 4.3 06/08/2021   RBC 3.95 06/08/2021   HGB 11.6 (L) 06/08/2021   HCT 35.8 (L) 06/08/2021   MCV 90.6 06/08/2021   MCH 29.4 06/08/2021   PLT 260 06/08/2021   MCHC 32.4 06/08/2021   RDW 12.4 06/08/2021   LYMPHSABS 1.7 06/08/2021   MONOABS 0.5 06/08/2021   EOSABS 0.2 06/08/2021   BASOSABS 0.0 06/08/2021     Last metabolic panel Lab Results  Component Value Date   NA 136 06/08/2021   K 3.5 06/08/2021   CL 99 06/08/2021   CO2 30 06/08/2021   BUN 14 06/08/2021   CREATININE 1.08 (H) 06/08/2021   GLUCOSE 118 (H) 06/08/2021   GFRNONAA 53 (L) 06/08/2021   GFRAA >60 08/13/2017   CALCIUM 8.5 (L) 06/08/2021   PROT 7.6 06/06/2021   ALBUMIN 3.4 (L) 06/06/2021   BILITOT 0.6 06/06/2021   ALKPHOS 57 06/06/2021   AST 38 06/06/2021   ALT 25 06/06/2021   ANIONGAP 7 06/08/2021    CBG (last 3)  No results for input(s): GLUCAP in the last 72 hours.    Coagulation Profile: No results for input(s): INR, PROTIME in the last 168 hours.   Radiology Studies: CT ANGIO HEAD NECK W WO CM  Result Date: 06/06/2021 CLINICAL DATA:  Initial evaluation for acute head trauma, syncope. EXAM: CT ANGIOGRAPHY HEAD AND NECK TECHNIQUE: Multidetector CT imaging of the head and neck was performed using the standard protocol during bolus administration of intravenous contrast. Multiplanar CT image reconstructions and  MIPs were obtained to evaluate the vascular anatomy. Carotid stenosis measurements (when applicable) are obtained utilizing NASCET criteria, using the distal internal carotid diameter as the denominator. RADIATION DOSE REDUCTION: This exam was performed according to the departmental dose-optimization program which includes automated exposure control, adjustment of the mA and/or kV according to patient size  and/or use of iterative reconstruction technique. CONTRAST:  75mL OMNIPAQUE IOHEXOL 350 MG/ML SOLN COMPARISON:  Prior Study from 11/20/2014. FINDINGS: CT HEAD FINDINGS Brain: Age-related cerebral atrophy with mild chronic small vessel ischemic disease. No acute intracranial hemorrhage. No acute large vessel territory infarct. No mass lesion or midline shift. No hydrocephalus or extra-axial fluid collection. Vascular: No hyperdense vessel. Scattered vascular calcifications noted within the carotid siphons. Skull: Scalp soft tissues within normal limits.  Calvarium intact. Sinuses: Paranasal sinuses are clear. Trace right mastoid effusion noted. Orbits: Globes orbital soft tissues demonstrate no acute finding. Review of the MIP images confirms the above findings CTA NECK FINDINGS Aortic arch: Visualized aortic arch normal in caliber with normal 3 vessel morphology. Mild plaque about the arch and origin of the great vessels without hemodynamically significant stenosis. Right carotid system: Right common and internal carotid arteries mildly tortuous but widely patent without stenosis or dissection. Left carotid system: Left common and internal carotid arteries are somewhat tortuous but widely patent without stenosis or dissection. Vertebral arteries: Both vertebral arteries arise from the subclavian arteries. No proximal subclavian artery stenosis. Both vertebral arteries widely patent without stenosis, dissection or occlusion. Skeleton: No discrete or worrisome osseous lesions. Moderate spondylosis present at C5-6  and C6-7. Other neck: No other acute soft tissue abnormality within the neck. Upper chest: Visualized upper chest demonstrates no acute finding. Review of the MIP images confirms the above findings CTA HEAD FINDINGS Anterior circulation: Both internal carotid arteries widely patent to the termini without stenosis. A1 segments widely patent. Normal anterior communicating artery complex. Both anterior cerebral arteries widely patent to their distal aspects without stenosis. No M1 stenosis or occlusion. Normal MCA bifurcations. Distal MCA branches well perfused and symmetric. Posterior circulation: Both vertebral arteries patent to the vertebrobasilar junction without stenosis. Both PICA patent at their origins. Basilar patent to its distal aspect without stenosis. Superior cerebral arteries patent bilaterally. Left PCA supplied via the basilar. Fetal type origin of the right PCA. Both PCAs well perfused or distal aspects. Venous sinuses: Patent allowing for timing the contrast bolus. Anatomic variants: Fetal type origin of the right PCA.  No aneurysm. Review of the MIP images confirms the above findings IMPRESSION: 1. Negative CTA of the head and neck. No large vessel occlusion or other acute vascular abnormality. Mild for age atheromatous disease without hemodynamically significant stenosis. 2. Mild diffuse tortuosity of the major arterial vasculature of the head and neck, which can be seen in the setting of chronic underlying hypertension. 3. No other acute intracranial abnormality. 4. Age-related cerebral atrophy with mild chronic small vessel ischemic disease. Electronically Signed   By: Rise MuBenjamin  McClintock M.D.   On: 06/06/2021 22:45   MR BRAIN WO CONTRAST  Result Date: 06/07/2021 CLINICAL DATA:  Numbness or tingling, paresthesia.  Syncope EXAM: MRI HEAD WITHOUT CONTRAST TECHNIQUE: Multiplanar, multiecho pulse sequences of the brain and surrounding structures were obtained without intravenous contrast.  COMPARISON:  CTA of the head neck from yesterday FINDINGS: Brain: No acute infarction, hemorrhage, hydrocephalus, extra-axial collection or mass lesion. Age normal brain volume and white matter appearance Vascular: Normal flow voids. Skull and upper cervical spine: Normal marrow signal. Sinuses/Orbits: Bilateral cataract resection. Mild mastoid opacification with negative nasopharynx. IMPRESSION: Age normal brain MRI. Electronically Signed   By: Tiburcio PeaJonathan  Watts M.D.   On: 06/07/2021 06:26   CT ABDOMEN PELVIS W CONTRAST  Result Date: 06/07/2021 CLINICAL DATA:  Abdominal pain, nausea, vomiting EXAM: CT ABDOMEN AND PELVIS WITH CONTRAST TECHNIQUE: Multidetector CT imaging  of the abdomen and pelvis was performed using the standard protocol following bolus administration of intravenous contrast. RADIATION DOSE REDUCTION: This exam was performed according to the departmental dose-optimization program which includes automated exposure control, adjustment of the mA and/or kV according to patient size and/or use of iterative reconstruction technique. CONTRAST:  OMNIPAQUE IOHEXOL 300 MG/ML  SOLN COMPARISON:  11/26/2012 FINDINGS: Lower chest: Small linear densities seen in the left lower lung fields suggesting scarring or subsegmental atelectasis. Hepatobiliary: No focal abnormality is seen in the liver. Gallbladder is unremarkable. Pancreas: No focal abnormality is seen. Spleen: Unremarkable. Adrenals/Urinary Tract: Adrenals are unremarkable. There is no hydronephrosis. There are no renal or ureteral stones. Urinary bladder is not distended and not optimally evaluated. Stomach/Bowel: There is moderate sized fixed hiatal hernia. Small bowel loops are not dilated. Appendix is not seen. There is no pericecal inflammation. Numerous diverticula are seen in colon without signs of focal acute diverticulitis. Vascular/Lymphatic: Scattered arterial calcifications are seen. Reproductive: There is 2.6 cm calcified uterine  fibroid. There are no adnexal masses. Other: There is no ascites or pneumoperitoneum. Small bilateral inguinal hernias containing fat are seen. Small umbilical hernia containing fat is seen. There is subcutaneous stranding and pockets of air in the left abdominal wall, possibly due to parenteral administration of medication. Musculoskeletal: There is severe spinal stenosis at L4-L5 level with encroachment of neural foramina. IMPRESSION: There is no evidence of intestinal obstruction or pneumoperitoneum. There is no hydronephrosis. Diverticulosis of colon without signs of focal diverticulitis. There is small calcified uterine fibroid. Moderate sized fixed hiatal hernia. 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 Other findings as described in the body of the report. Electronically Signed   By: Ernie Avena M.D.   On: 06/07/2021 18:26   DG Chest Portable 1 View  Result Date: 06/06/2021 CLINICAL DATA:  Shortness of breath EXAM: PORTABLE CHEST 1 VIEW COMPARISON:  08/10/2017 FINDINGS: Borderline cardiomegaly. No focal opacity or pleural effusion. No pneumothorax. IMPRESSION: No active disease.  Borderline to mild cardiomegaly Electronically Signed   By: Jasmine Pang M.D.   On: 06/06/2021 18:26   EEG adult  Result Date: 06/07/2021 Charlsie Quest, MD     06/07/2021  8:25 AM Patient Name: Autumn Young MRN: [id number] Epilepsy Attending: Charlsie Quest Referring Physician/Provider: Anselm Jungling, DO Date: 06/07/2021 Duration: 22.42 mins Patient history: 76 year old female who presented with syncope with loss of consciousness, head jerking, left-sided paresthesias and weakness.  EEG to evaluate for seizure. Level of alertness: Awake, asleep AEDs during EEG study: None Technical aspects: This EEG study was done with scalp electrodes positioned according to the 10-20 International system of electrode placement. Electrical activity was acquired at a  sampling rate of  and reviewed with a high frequency filter of  and a low frequency filter of . EEG data were recorded continuously and digitally stored. Description: The posterior dominant rhythm consists of 8-9 Hz activity of moderate voltage (25-35 uV) seen predominantly in posterior head regions, symmetric and reactive to eye opening and eye closing. Sleep was characterized by vertex waves, sleep spindles (12 to 14 Hz), maximal frontocentral region. Hyperventilation and photic stimulation were not performed.   IMPRESSION: This study is within normal limits. No seizures or epileptiform discharges were seen throughout the recording. Charlsie Quest   ECHOCARDIOGRAM COMPLETE  Result Date: 06/07/2021    ECHOCARDIOGRAM REPORT   Patient Name:   Autumn Young Date of Exam: 06/07/2021  Medical Rec #:  [id number]      Height:       66.0 in Accession #:    [id number]     Weight:       300.0 lb Date of Birth:  1945/11/22      BSA:          2.376 m Patient Age:    75 years       BP:           136/65 mmHg Patient Gender: F              HR:           56 bpm. Exam Location:  Inpatient Procedure: 2D Echo Indications:    Syncope  History:        Patient has no prior history of Echocardiogram examinations.                 COPD; Risk Factors:Hypertension and Sleep Apnea.  Sonographer:    [name] Referring Phys: [id number] CHING T TU IMPRESSIONS  1. Left ventricular ejection fraction, by estimation, is 60 to 65%. The left ventricle has normal function. The left ventricle has no regional wall motion abnormalities. There is moderate concentric left ventricular hypertrophy. Left ventricular diastolic parameters are consistent with Grade II diastolic dysfunction (pseudonormalization).  2. Right ventricular systolic function is normal. The right ventricular size is normal. There is normal pulmonary artery systolic pressure.  3. Left atrial size was mildly dilated.  4. The mitral valve is normal in structure. No  evidence of mitral valve regurgitation. No evidence of mitral stenosis.  5. The aortic valve is tricuspid. Aortic valve regurgitation is not visualized. Aortic valve sclerosis is present, with no evidence of aortic valve stenosis.  6. The inferior vena cava is normal in size with greater than 50% respiratory variability, suggesting right atrial pressure of 3 mmHg. Comparison(s): No prior Echocardiogram. FINDINGS  Left Ventricle: Left ventricular ejection fraction, by estimation, is 60 to 65%. The left ventricle has normal function. The left ventricle has no regional wall motion abnormalities. The left ventricular internal cavity size was normal in size. There is  moderate concentric left ventricular hypertrophy. Left ventricular diastolic parameters are consistent with Grade II diastolic dysfunction (pseudonormalization). Right Ventricle: The right ventricular size is normal. No increase in right ventricular wall thickness. Right ventricular systolic function is normal. There is normal pulmonary artery systolic pressure. The tricuspid regurgitant velocity is 2.44 m/s, and  with an assumed right atrial pressure of 3 mmHg, the estimated right ventricular systolic pressure is 26.8 mmHg. Left Atrium: Left atrial size was mildly dilated. Right Atrium: Right atrial size was normal in size. Pericardium: Trivial pericardial effusion is present. Presence of epicardial fat layer. Mitral Valve: The mitral valve is normal in structure. No evidence of mitral valve regurgitation. No evidence of mitral valve stenosis. Tricuspid Valve: The tricuspid valve is normal in structure. Tricuspid valve regurgitation is mild . No evidence of tricuspid stenosis. Aortic Valve: The aortic valve is tricuspid. Aortic valve regurgitation is not visualized. Aortic valve sclerosis is present, with no evidence of aortic valve stenosis. Aortic valve peak gradient measures 19.8 mmHg. Pulmonic Valve: The pulmonic valve was not well visualized. Pulmonic  valve regurgitation is not visualized. No evidence of pulmonic stenosis. Aorta: The aortic root and ascending aorta are structurally normal, with no evidence of dilitation. Venous: The inferior vena cava is normal in size with greater than 50% respiratory variability, suggesting right atrial pressure of 3  mmHg. IAS/Shunts: No atrial level shunt detected by color flow Doppler.  LEFT VENTRICLE PLAX 2D LVIDd:         4.40 cm   Diastology LVIDs:         2.70 cm   LV e' medial:    5.18 cm/s LV PW:         1.60 cm   LV E/e' medial:  20.5 LV IVS:        1.50 cm   LV e' lateral:   6.98 cm/s LVOT diam:     2.00 cm   LV E/e' lateral: 15.2 LV SV:         130 LV SV Index:   55 LVOT Area:     3.14 cm  RIGHT VENTRICLE             IVC RV Basal diam:  2.80 cm     IVC diam: 1.90 cm RV S prime:     14.80 cm/s TAPSE (M-mode): 2.4 cm LEFT ATRIUM              Index        RIGHT ATRIUM           Index LA diam:        4.80 cm  2.02 cm/m   RA Area:     14.80 cm LA Vol (A2C):   102.0 ml 42.93 ml/m  RA Volume:   37.20 ml  15.66 ml/m LA Vol (A4C):   71.8 ml  30.22 ml/m LA Biplane Vol: 87.1 ml  36.66 ml/m  AORTIC VALVE                  PULMONIC VALVE AV Area (Vmax): 2.40 cm      PV Vmax:       1.23 m/s AV Vmax:        222.50 cm/s   PV Peak grad:  6.1 mmHg AV Peak Grad:   19.8 mmHg LVOT Vmax:      170.00 cm/s LVOT Vmean:     110.000 cm/s LVOT VTI:       0.414 m  AORTA Ao Root diam: 3.10 cm Ao Asc diam:  2.90 cm MITRAL VALVE                TRICUSPID VALVE MV Area (PHT): 2.49 cm     TR Peak grad:   23.8 mmHg MV Decel Time: 305 msec     TR Vmax:        244.00 cm/s MV E velocity: 106.00 cm/s MV A velocity: 110.00 cm/s  SHUNTS MV E/A ratio:  0.96         Systemic VTI:  0.41 m                             Systemic Diam: 2.00 cm Riley Lam MD Electronically signed by Riley Lam MD Signature Date/Time: 06/07/2021/2:47:31 PM    Final        Kathlen Mody M.D. [facility] 06/08/2021, 3:03 PM  Available via Epic  secure chat 7am-7pm After 7 pm, please refer to night coverage provider listed on amion.

## 2021-06-08 NOTE — TOC Initial Note (Signed)
Transition of Care (TOC) - Initial/Assessment Note  ? ? ?Patient Details  ?Name: Autumn LaityLinda P Shults ?MRN: [id number] ?Date of Birth: 08/28/1945 ? ?Transition of Care (TOC) CM/SW Contact:    ?Bess KindsWendi B Oretta Berkland, RN ?Phone Number: [phone] ?06/08/2021, 4:56 PM ? ?Clinical Narrative:                 ? ?Spoke with patient at the bedside to discuss post acute transition. PTA home with a friend. Typically drives herself. No DME, but agreeable to rollator as precommended by PT. Referral to [employer] for delivery to room. Discussed PCP needs - patient would like a [place] MD, assisted patient with scheduling appointment with new PCP and information placed on AVS. Anticipate transition home tomorrow. TOC following for transition needs.  ? ?Expected Discharge Plan: Home/Self Care ?Barriers to Discharge: Continued Medical Work up ? ? ?Patient Goals and CMS Choice ?Patient states their goals for this hospitalization and ongoing recovery are:: return home ?CMS Medicare.gov Compare Post Acute Care list provided to:: Patient ?Choice offered to / list presented to : Patient ? ?Expected Discharge Plan and Services ?Expected Discharge Plan: Home/Self Care ?  ?Discharge Planning Services: CM Consult ?Post Acute Care Choice: Durable Medical Equipment ?Living arrangements for the past 2 months: Single Family Home ?                ?DME Arranged: Walker rolling with seat ?DME Agency: [facility] ?Date DME Agency Contacted: 06/08/21 ?Time DME Agency Contacted: 1655 ?Representative spoke with at DME Agency: Leavy CellaJasmine ?HH Arranged: NA ?HH Agency: NA ?  ?  ?  ? ?Prior Living Arrangements/Services ?Living arrangements for the past 2 months: Single Family Home ?Lives with:: Self, Roommate ?Patient language and need for interpreter reviewed:: Yes ?Do you feel safe going back to the place where you live?: Yes      ?Need for Family Participation in Patient Care: No (Comment) ?  ?  ?Criminal Activity/Legal Involvement Pertinent to Current  Situation/Hospitalization: No - Comment as needed ? ?Activities of Daily Living ?Home Assistive Devices/Equipment: Cane (specify quad or straight), Walker (specify type) ?ADL Screening (condition at time of admission) ?Patient's cognitive ability adequate to safely complete daily activities?: Yes ?Is the patient deaf or have difficulty hearing?: No ?Does the patient have difficulty seeing, even when wearing glasses/contacts?: No ?Does the patient have difficulty concentrating, remembering, or making decisions?: No ?Patient able to express need for assistance with ADLs?: Yes ?Does the patient have difficulty dressing or bathing?: No ?Independently performs ADLs?: Yes (appropriate for developmental age) ?Does the patient have difficulty walking or climbing stairs?: Yes ?Weakness of Legs: Both ?Weakness of Arms/Hands: None ? ?Permission Sought/Granted ?  ?  ?   ?   ?   ?   ? ?Emotional Assessment ?Appearance:: Appears stated age ?Attitude/Demeanor/Rapport: Engaged ?Affect (typically observed): Accepting ?Orientation: : Oriented to Self, Oriented to Place, Oriented to  Time, Oriented to Situation ?Alcohol / Substance Use: Not Applicable ?Psych Involvement: No (comment) ? ?Admission diagnosis:  Paresthesia [R20.2] ?Chest discomfort [R07.89] ?Paresthesias [R20.2] ?Syncope, unspecified syncope type [R55] ?Patient Active Problem List  ? Diagnosis Date Noted  ? Paresthesia 06/07/2021  ? Syncope 06/07/2021  ? History of CVA (cerebrovascular accident) 06/07/2021  ? COPD exacerbation (HCC) 08/10/2017  ? OSA (obstructive sleep apnea) 11/28/2015  ? COPD (chronic obstructive pulmonary disease) with chronic bronchitis (HCC) 06/26/2015  ? Pulmonary hypertension (HCC) 06/26/2015  ? 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 Mild intermittent asthma without complication 11/19/2014  ? Mixed  hyperlipidemia 11/19/2014  ? CVA (cerebral vascular accident) ([place]) 11/18/2014  ? Essential hypertension 11/18/2014  ? ?PCP:  Patient, No  Pcp Per (Inactive) ?Pharmacy:   ?[facility] [address], [address] ?[place] [address] ?Phone: [phone] Fax: [phone] ? ?[id number] [place], [place] [address] ?[place] [address] ?Phone: [phone] Fax: [phone] ? ? ? ? ?Social Determinants of Health (SDOH) Interventions ?  ? ?Readmission Risk Interventions ?No flowsheet data found. ? ? ?

## 2021-06-08 NOTE — Consult Note (Addendum)
Cardiology Consultation:   Patient ID: Autumn Young MRN: [id number]; DOB: [birth year]  Admit date: 06/06/2021 Date of Consult: 06/08/2021  PCP:  Patient, No Pcp Per (Inactive)   [facility] Providers Cardiologist:  Previously followed by [facility] (last visit in 12/2018)  Patient Profile:   Autumn Young is a 76 y.o. female with a hx of HFpEF, CAD (mild nonobstructive CAD by cath in 2017 and 10/2019), palpitations (brief SVT by prior monitor), severe OSA, HTN, HLD, history of CVA who is being seen 06/08/2021 for the evaluation of questionable heart block at the request of Dr. Blake Divine.  History of Present Illness:   Autumn Young presented to [facility] ED on 06/06/2021 for evaluation of a syncopal episode which occurred while sitting down and she reported a shaking sensation in her head following the event along with a significant headache.  Initial labs showed WBC 6.2, Hgb 12.0, platelets 277, Na+ 138, K+ 3.4 and creatinine 0.98. Negative for COVID and Influenza. Initial and repeat Hs Troponin negative at 8 and 11. Hgb A1c 5.8. FLP showing total cholesterol 197, triglycerides 153 and LDL 129. TSH 10.269 with Free T3 at 2.3 and Free T4 at 0.75. EKG showed sinus bradycardia, HR 59 with no acute ST changes when compared to prior tracings. CXR showed no active disease. CTA Head showed no acute intracranial abnormalities. Was noted to have mild diffuse tortuosity of the major arterial vasculature of her head and neck concerning for chronic HTN. MRI Brain showed no acute findings.   She was admitted for further evaluation of her syncopal episode. EEG was performed and showed no seizures or epileptiform activity. Echocardiogram showed a preserved EF of 60-65% with no regional WMA. Was noted to have moderate LVH and Grade 2 DD with mild dilation of the LA. No significant valve abnormalities. Did report intermittent nausea, vomiting and diarrhea on the day of admission. CT Abdomen was obtained for  further evaluation and showed diverticulosis without diverticulitis. Was noted to have a moderate hiatal hernia and severe lumbar spondylosis at L4-L5 with spinal stenosis and encroachment of the neural foramina.   In talking with the patient today, she reports having a syncopal episode on the day of admission and says that she was sitting with her great-grandchild watching YouTube videos when all of a sudden she started to feel her head shake from side to side and was going in and out of consciousness. Says she did lose control of her bowel and bladder. Unaware of any specific chest pain or palpitations. She does report she has been feeling weak and had a decreased appetite since undergoing tooth extraction 2 weeks ago. Reports intermittent palpitations over the past few years but no persistent symptoms. Reports her last syncopal episode was 30+ years ago and is unaware of the culprit at that time. She reports that her weakness she was experiencing prior to admission has significantly improved with her antihypertensives being held and questions if she was on too strong of medications.   Past Medical History:  Diagnosis Date   Asthma    COPD (chronic obstructive pulmonary disease) (HCC)    GERD (gastroesophageal reflux disease)    Hypertension    Stroke [facility])     Past Surgical History:  Procedure Laterality Date   TUBAL LIGATION       Home Medications:  Prior to Admission medications   Medication Sig Start Date End Date Taking? Authorizing Provider  albuterol (PROVENTIL HFA) 108 (90 Base) MCG/ACT inhaler Inhale 2 puffs  into the lungs every 6 (six) hours as needed for wheezing or shortness of breath. 08/13/17  Yes Hongalgi, Maximino Greenland, MD  amLODipine (NORVASC) 5 MG tablet Take 5 mg by mouth daily. 04/17/21  Yes [provider]  ascorbic acid (VITAMIN C) 500 MG tablet Take 1,000 mg by mouth daily. 05/14/18  Yes [provider]  BREO ELLIPTA 200-25 MCG/ACT AEPB Inhale 1 puff into the  lungs daily. 05/18/21  Yes [provider]  chlorthalidone (HYGROTON) 25 MG tablet Take 25 mg by mouth daily.   Yes [provider]  Cholecalciferol 125 MCG (5000 UT) TABS Take 5,000 Units by mouth daily.   Yes [provider]  clopidogrel (PLAVIX) 75 MG tablet Take 75 mg by mouth daily. 11/26/15  Yes [provider]  ezetimibe (ZETIA) 10 MG tablet Take 10 mg by mouth daily. 05/24/21  Yes [provider]  montelukast (SINGULAIR) 10 MG tablet Take 10 mg by mouth at bedtime. 03/26/21  Yes [provider]  olmesartan (BENICAR) 20 MG tablet Take 20 mg by mouth at bedtime. 04/21/21  Yes [provider]  Omega-3 1000 MG CAPS Take 1,000 mg by mouth daily.   Yes [provider]  pantoprazole (PROTONIX) 40 MG tablet Take 40 mg by mouth 2 (two) times daily.  09/27/15  Yes [provider]  potassium chloride (KLOR-CON) 10 MEQ tablet Take 20 mEq by mouth 2 (two) times daily. 05/15/21  Yes [provider]  SPIRIVA RESPIMAT 1.25 MCG/ACT AERS Inhale 2 puffs into the lungs daily. 05/13/21  Yes [provider]    Inpatient Medications: Scheduled Meds:  clopidogrel  75 mg Oral Daily   enoxaparin (LOVENOX) injection  40 mg Subcutaneous Q24H   vitamin B-12  1,000 mcg Oral Daily   Continuous Infusions:  PRN Meds: acetaminophen, ondansetron (ZOFRAN) IV  Allergies:    Allergies  Allergen Reactions   Shellfish Allergy Anaphylaxis   Simvastatin Swelling   Lasix [Furosemide] Swelling   Latex     rash   Penicillins Rash    Has patient had a PCN reaction causing immediate rash, facial/tongue/throat swelling, SOB or lightheadedness with hypotension: Unknown Has patient had a PCN reaction causing severe rash involving mucus membranes or skin necrosis: No Has patient had a PCN reaction that required hospitalization: No Has patient had a PCN reaction occurring within the last 10 years: No If all of the above answers are  "NO", then may proceed with Cephalosporin use.     Social History:   Social History   Socioeconomic History   Marital status: Divorced    Spouse name: Not on file   Number of children: Not on file   Years of education: Not on file   Highest education level: Not on file  Occupational History   Not on file  Tobacco Use   Smoking status: Never   Smokeless tobacco: Never  Substance and Sexual Activity   Alcohol use: No   Drug use: No   Sexual activity: Not on file  Other Topics Concern   Not on file  Social History Narrative   Not on file   Social Determinants of Health   Financial Resource Strain: Not on file  Food Insecurity: Not on file  Transportation Needs: Not on file  Physical Activity: Not on file  Stress: Not on file  Social Connections: Not on file  Intimate Partner Violence: Not on file    Family History:    Family History  Problem Relation Age  of Onset   Cancer Mother    Hypertension Father    Cancer Sister    Diabetes Sister    Hypertension Sister    Heart disease Sister    Cancer Brother    Hypertension Brother      ROS:  Please see the history of present illness.   All other ROS reviewed and negative.     Physical Exam/Data:   Vitals:   06/07/21 1522 06/07/21 2044 06/08/21 0459 06/08/21 0900  BP: (!) 142/60 (!) 141/51 (!) 125/58 132/70  Pulse: (!) 57 (!) 53 (!) 52 (!) 53  Resp: Temp: 98.4 F (36.9 C) 98 F (36.7 C) 97.7 F (36.5 C) 98.1 F (36.7 C)  TempSrc:      SpO2: 97% 94% 91% 93%  Weight:      Height:        Intake/Output Summary (Last 24 hours) at 06/08/2021 1141 Last data filed at 06/08/2021 0900 Gross per 24 hour  Intake 540 ml  Output 0 ml  Net 540 ml   Last 3 Weights 06/06/2021 08/13/2017 08/12/2017  Weight (lbs) 300 lb 300 lb 9.6 oz 305 lb 9.6 oz  Weight (kg) 136.079 kg 136.351 kg 138.619 kg     Body mass index is 48.42 kg/m.  General: Pleasant obese female appearing in no acute distress.  HEENT:  normal Neck: no JVD Vascular: No carotid bruits; Distal pulses 2+ bilaterally Cardiac:  normal S1, S2; RRR; no murmur Lungs:  clear to auscultation bilaterally, no wheezing, rhonchi or rales  Abd: soft, nontender, no hepatomegaly  Ext: no pitting edema Musculoskeletal:  No deformities, BUE and BLE strength normal and equal Skin: warm and dry  Neuro:  CNs 2-12 intact, no focal abnormalities noted Psych:  Normal affect   EKG:  The EKG was personally reviewed and demonstrates: Sinus bradycardia, HR 59 with no acute ST changes when compared to prior tracings Telemetry:  Telemetry was personally reviewed and demonstrates: Sinus bradycardia, HR in 50's. 3 beats of non-conducted p-waves but no persistent heart block.   Relevant CV Studies:   Cardiac Catheterization: 05/2015 Findings:  1. Non-obstructive coronary artery disease (as described below) including  30% proximal LAD stenosis.  2. Elevated left ventricular filling pressures (LVEPD = 19 mm Hg).  3. Normal left ventricular contractile function (estimated LVEF = 60 %).  4. Mild pulmonary hypertension (mean PA pressure 25 mm Hg)   Recommendations:  1. Aggressive secondary prevention.  2. ASA 81 mg indefinitely  3. Follow up with primary cardiologist.    Zio Patch: 12/2018 Patient had a min HR of 44 bpm, max HR of 114 bpm, and avg HR of 63 bpm.   Predominant underlying rhythm was Sinus Rhythm.   First Degree AV Block was present. 1 run of Supraventricular Tachycardia  occurred lasting 6 beats with a max rate of 114 bpm (avg 106 bpm).  Isolated SVEs were rare (<1.0%), SVE Couplets were rare (<1.0%), and SVE  Triplets were rare (<1.0%).   Isolated VEs were rare (<1.0%), and no VE Couplets or VE Triplets were  present.   Echo: 10/2019 SUMMARY  Mild left ventricular hypertrophy  LV ejection fraction = >70%.  The left ventricle is hyperdynamic.  An intracavitary gradient is present, mean gradient 15 mmHg  There is trace  tricuspid regurgitation.  There is trace mitral regurgitation.  There is no pericardial effusion.  No gradient was reported on the last study.    Cardiac Catheterization: 10/2019 This  result has an attachment that is not available.  Mild nonobstructive CAD  Note: anterior RCA origin, engaged with AL1  Normal LV function (echo)  No LVOT gradient by catheter pullback   PLAN: nonischemic evaluation   Coronary Findings Diagnostic Dominance: Right  Left Anterior Descending: Ost LAD to Prox LAD lesion is 20% stenosed.  Right Coronary Artery: Prox RCA to Mid RCA lesion is 25% stenosed. TIMI flow is 3.   Intervention  No interventions have been documented.  Echocardiogram: 05/2021 IMPRESSIONS     1. Left ventricular ejection fraction, by estimation, is 60 to 65%. The  left ventricle has normal function. The left ventricle has no regional  wall motion abnormalities. There is moderate concentric left ventricular  hypertrophy. Left ventricular  diastolic parameters are consistent with Grade II diastolic dysfunction  (pseudonormalization).   2. Right ventricular systolic function is normal. The right ventricular  size is normal. There is normal pulmonary artery systolic pressure.   3. Left atrial size was mildly dilated.   4. The mitral valve is normal in structure. No evidence of mitral valve  regurgitation. No evidence of mitral stenosis.   5. The aortic valve is tricuspid. Aortic valve regurgitation is not  visualized. Aortic valve sclerosis is present, with no evidence of aortic  valve stenosis.   6. The inferior vena cava is normal in size with greater than 50%  respiratory variability, suggesting right atrial pressure of 3 mmHg.   Comparison(s): No prior Echocardiogram.    Laboratory Data:  High Sensitivity Troponin:   Recent Labs  Lab 06/06/21 1846 06/06/21 1953  TROPONINIHS 8 11     Chemistry Recent Labs  Lab 06/06/21 1846 06/07/21 0401 06/08/21 0216  NA 138  136 136  K 3.4* 3.1* 3.5  CL 100 98 99  CO2 GLUCOSE 106* 101* 118*  BUN CREATININE 0.98 0.95 1.08*  CALCIUM 8.6* 8.4* 8.5*  GFRNONAA 59* >60 53*  ANIONGAP Recent Labs  Lab 06/06/21 1846  PROT 7.6  ALBUMIN 3.4*  AST 38  ALT 25  ALKPHOS 57  BILITOT 0.6   Lipids  Recent Labs  Lab 06/07/21 0401  CHOL 197  TRIG 153*  HDL 37*  LDLCALC 129*  CHOLHDL 5.3    Hematology Recent Labs  Lab 06/06/21 1846 06/08/21 0216  WBC 6.2 4.3  RBC 4.10 3.95  HGB 12.0 11.6*  HCT 37.2 35.8*  MCV 90.7 90.6  MCH 29.3 29.4  MCHC 32.3 32.4  RDW 12.5 12.4  PLT 277 260   Thyroid  Recent Labs  Lab 06/07/21 0401 06/07/21 1614  TSH 10.269*  --   FREET4  --  0.75    BNPNo results for input(s): BNP, PROBNP in the last 168 hours.  DDimer No results for input(s): DDIMER in the last 168 hours.   Radiology/Studies:  CT ANGIO HEAD NECK W WO CM  Result Date: 06/06/2021 CLINICAL DATA:  Initial evaluation for acute head trauma, syncope. EXAM: CT ANGIOGRAPHY HEAD AND NECK TECHNIQUE: Multidetector CT imaging of the head and neck was performed using the standard protocol during bolus administration of intravenous contrast. Multiplanar CT image reconstructions and MIPs were obtained to evaluate the vascular anatomy. Carotid stenosis measurements (when applicable) are obtained utilizing NASCET criteria, using the distal internal carotid diameter as the denominator. RADIATION DOSE REDUCTION: This exam was performed according to the departmental dose-optimization program which includes automated exposure control, adjustment of the mA and/or  kV according to patient size and/or use of iterative reconstruction technique. CONTRAST:  46mL OMNIPAQUE IOHEXOL 350 MG/ML SOLN COMPARISON:  Prior Study from 11/20/2014. FINDINGS: CT HEAD FINDINGS Brain: Age-related cerebral atrophy with mild chronic small vessel ischemic disease. No acute intracranial hemorrhage. No acute large vessel territory  infarct. No mass lesion or midline shift. No hydrocephalus or extra-axial fluid collection. Vascular: No hyperdense vessel. Scattered vascular calcifications noted within the carotid siphons. Skull: Scalp soft tissues within normal limits.  Calvarium intact. Sinuses: Paranasal sinuses are clear. Trace right mastoid effusion noted. Orbits: Globes orbital soft tissues demonstrate no acute finding. Review of the MIP images confirms the above findings CTA NECK FINDINGS Aortic arch: Visualized aortic arch normal in caliber with normal 3 vessel morphology. Mild plaque about the arch and origin of the great vessels without hemodynamically significant stenosis. Right carotid system: Right common and internal carotid arteries mildly tortuous but widely patent without stenosis or dissection. Left carotid system: Left common and internal carotid arteries are somewhat tortuous but widely patent without stenosis or dissection. Vertebral arteries: Both vertebral arteries arise from the subclavian arteries. No proximal subclavian artery stenosis. Both vertebral arteries widely patent without stenosis, dissection or occlusion. Skeleton: No discrete or worrisome osseous lesions. Moderate spondylosis present at C5-6 and C6-7. Other neck: No other acute soft tissue abnormality within the neck. Upper chest: Visualized upper chest demonstrates no acute finding. Review of the MIP images confirms the above findings CTA HEAD FINDINGS Anterior circulation: Both internal carotid arteries widely patent to the termini without stenosis. A1 segments widely patent. Normal anterior communicating artery complex. Both anterior cerebral arteries widely patent to their distal aspects without stenosis. No M1 stenosis or occlusion. Normal MCA bifurcations. Distal MCA branches well perfused and symmetric. Posterior circulation: Both vertebral arteries patent to the vertebrobasilar junction without stenosis. Both PICA patent at their origins. Basilar  patent to its distal aspect without stenosis. Superior cerebral arteries patent bilaterally. Left PCA supplied via the basilar. Fetal type origin of the right PCA. Both PCAs well perfused or distal aspects. Venous sinuses: Patent allowing for timing the contrast bolus. Anatomic variants: Fetal type origin of the right PCA.  No aneurysm. Review of the MIP images confirms the above findings IMPRESSION: 1. Negative CTA of the head and neck. No large vessel occlusion or other acute vascular abnormality. Mild for age atheromatous disease without hemodynamically significant stenosis. 2. Mild diffuse tortuosity of the major arterial vasculature of the head and neck, which can be seen in the setting of chronic underlying hypertension. 3. No other acute intracranial abnormality. 4. Age-related cerebral atrophy with mild chronic small vessel ischemic disease. Electronically Signed   By: Rise Mu M.D.   On: 06/06/2021 22:45   MR BRAIN WO CONTRAST  Result Date: 06/07/2021 CLINICAL DATA:  Numbness or tingling, paresthesia.  Syncope EXAM: MRI HEAD WITHOUT CONTRAST TECHNIQUE: Multiplanar, multiecho pulse sequences of the brain and surrounding structures were obtained without intravenous contrast. COMPARISON:  CTA of the head neck from yesterday FINDINGS: Brain: No acute infarction, hemorrhage, hydrocephalus, extra-axial collection or mass lesion. Age normal brain volume and white matter appearance Vascular: Normal flow voids. Skull and upper cervical spine: Normal marrow signal. Sinuses/Orbits: Bilateral cataract resection. Mild mastoid opacification with negative nasopharynx. IMPRESSION: Age normal brain MRI. Electronically Signed   By: Tiburcio Pea M.D.   On: 06/07/2021 06:26   CT ABDOMEN PELVIS W CONTRAST  Result Date: 06/07/2021 CLINICAL DATA:  Abdominal pain, nausea, vomiting EXAM: CT ABDOMEN AND PELVIS WITH  CONTRAST TECHNIQUE: Multidetector CT imaging of the abdomen and pelvis was performed using the  standard protocol following bolus administration of intravenous contrast. RADIATION DOSE REDUCTION: This exam was performed according to the departmental dose-optimization program which includes automated exposure control, adjustment of the mA and/or kV according to patient size and/or use of iterative reconstruction technique. CONTRAST:  100mL OMNIPAQUE IOHEXOL 300 MG/ML  SOLN COMPARISON:  11/26/2012 FINDINGS: Lower chest: Small linear densities seen in the left lower lung fields suggesting scarring or subsegmental atelectasis. Hepatobiliary: No focal abnormality is seen in the liver. Gallbladder is unremarkable. Pancreas: No focal abnormality is seen. Spleen: Unremarkable. Adrenals/Urinary Tract: Adrenals are unremarkable. There is no hydronephrosis. There are no renal or ureteral stones. Urinary bladder is not distended and not optimally evaluated. Stomach/Bowel: There is moderate sized fixed hiatal hernia. Small bowel loops are not dilated. Appendix is not seen. There is no pericecal inflammation. Numerous diverticula are seen in colon without signs of focal acute diverticulitis. Vascular/Lymphatic: Scattered arterial calcifications are seen. Reproductive: There is 2.6 cm calcified uterine fibroid. There are no adnexal masses. Other: There is no ascites or pneumoperitoneum. Small bilateral inguinal hernias containing fat are seen. Small umbilical hernia containing fat is seen. There is subcutaneous stranding and pockets of air in the left abdominal wall, possibly due to parenteral administration of medication. Musculoskeletal: There is severe spinal stenosis at L4-L5 level with encroachment of neural foramina. IMPRESSION: There is no evidence of intestinal obstruction or pneumoperitoneum. There is no hydronephrosis. Diverticulosis of colon without signs of focal diverticulitis. There is small calcified uterine fibroid. Moderate sized fixed hiatal hernia. Severe lumbar spondylosis at L4-L5 level with marked  spinal stenosis and encroachment of neural foramina. Other findings as described in the body of the report. Electronically Signed   By: Ernie AvenaPalani  Rathinasamy M.D.   On: 06/07/2021 18:26   DG Chest Portable 1 View  Result Date: 06/06/2021 CLINICAL DATA:  Shortness of breath EXAM: PORTABLE CHEST 1 VIEW COMPARISON:  08/10/2017 FINDINGS: Borderline cardiomegaly. No focal opacity or pleural effusion. No pneumothorax. IMPRESSION: No active disease.  Borderline to mild cardiomegaly Electronically Signed   By: Jasmine PangKim  Fujinaga M.D.   On: 06/06/2021 18:26   EEG adult  Result Date: 06/07/2021 Charlsie QuestYadav, Priyanka O, MD     06/07/2021  8:25 AM Patient Name: Autumn LaityLinda P Schweizer MRN: [id number] Epilepsy Attending: Charlsie QuestPriyanka O Yadav Referring Physician/Provider: Anselm Junglingu, Ching T, DO Date: 06/07/2021 Duration: 22.42 mins Patient history: 76 year old female who presented with syncope with loss of consciousness, head jerking, left-sided paresthesias and weakness.  EEG to evaluate for seizure. Level of alertness: Awake, asleep AEDs during EEG study: None Technical aspects: This EEG study was done with scalp electrodes positioned according to the 10-20 International system of electrode placement. Electrical activity was acquired at a sampling rate of 500Hz  and reviewed with a high frequency filter of 70Hz  and a low frequency filter of 1Hz . EEG data were recorded continuously and digitally stored. Description: The posterior dominant rhythm consists of 8-9 Hz activity of moderate voltage (25-35 uV) seen predominantly in posterior head regions, symmetric and reactive to eye opening and eye closing. Sleep was characterized by vertex waves, sleep spindles (12 to 14 Hz), maximal frontocentral region. Hyperventilation and photic stimulation were not performed.   IMPRESSION: This study is within normal limits. No seizures or epileptiform discharges were seen throughout the recording. Priyanka O Yadav   Assessment and Plan:   1. Syncope - Her episode of  syncope on the day of admission overall  sounds atypical for a cardiac etiology given her head shaking, urinary incontinence and bowel incontinence. Neurologic work-up has been unrevealing thus far including normal Brain MRI and EEG being unrevealing. - On telemetry, she has been in sinus bradycardia with heart rate in the 50's and did have occasional non-conducted P waves but no persistent heart block or significant pauses. She was not on any AV nodal blocking agents prior to admission and would continue to avoid given her baseline bradycardia. Can continue to follow on telemetry this admission but would anticipate an outpatient monitor for further evaluation.  2. CAD - She had mild nonobstructive CAD by cath in 2017 and 10/2019. Hs Troponin values have been negative and her EKG shows no acute ST changes. Echo this admission shows a preserved EF with no wall motion abnormalities. No plans for further ischemic testing. - She remains on Plavix 75 mg daily per Neurology. She should be on statin therapy given her history of CVA but will defer to her PCP (history of statin intolerance per her chart but could consider PCKS-9 inhibitor therapy).   3. HTN - Her blood pressure has been well-controlled this admission, at 132/70 on most recent check. She is listed as being on Amlodipine 5 mg daily, Chlorthalidone 25 mg daily and Benicar 20 mg daily as an outpatient but reports she did not take her Amlodipine routinely. Says that she feels much better off of her medications. Would recommend resuming Benicar 20 mg daily and encourage her to follow BP readings at home and she may require further dose adjustments.   For questions or updates, please contact [facility] Please consult [email or web address] for contact info under    Signed, Ellsworth Lennox, PA-C  06/08/2021 11:41 AM   Patient seen and examined and agree with Randall An, PA-C as detailed above.  In brief, the patient is a 76 y.o. female with a  hx of HFpEF, CAD (mild nonobstructive CAD by cath in 2017 and 10/2019), palpitations (brief SVT by prior monitor), severe OSA, HTN, HLD, history of CVA who presented with syncope\. Cardiology is consulted for concern for heart block on the monitor.  Patient presented to [facility] ED following a syncopal event from a seated position. Labs notable for negative trop. ECG with SB with no ischemic changes. CTA head without acute pathology. MRI brain with no acute findings. EEG unremarkable. TTE demonstrated a preserved EF of 60-65% with no regional WMA, moderate LVH, G2DD, mild dilation of the LA, no significant valve abnormalities.  Cath in 2021 with mild disease.   On review of telemetry, there is temporary, nonsustained Mobitz type I AVB but no high degree AVB and no arrhythmia/pauses that would explain syncopal episode. She is not on AV nodal agents.  Recommend outpatient 30day monitor for further evaluation.   GEN: No acute distress.   Neck: No JVD Cardiac: RRR, no murmurs, rubs, or gallops.  Respiratory: Clear to auscultation bilaterally. GI: Obese, soft, nontender, non-distended  MS: No edema; No deformity. Neuro:  Nonfocal  Psych: Normal affect    Plan: -No evidence of high degree AVB on telemetry -TTE with normal BiV function and no significant valve disease -Cath 10/2019 with mild, nonobstructive disease -Recommend 30d monitor following discharge (will arrange) -Will arrange CV follow-up  Cardiology will sign off.  Laurance Flatten, MD

## 2021-06-08 NOTE — Care Management Obs Status (Signed)
MEDICARE OBSERVATION STATUS NOTIFICATION ? ? ?Patient Details  ?Name: Autumn Young ?MRN: [id number] ?Date of Birth: 02-07-1946 ? ? ?Medicare Observation Status Notification Given:  Yes ? ? ? ?Bartholomew Crews, RN ?06/08/2021, 4:26 PM ?

## 2021-06-09 ENCOUNTER — Other Ambulatory Visit: Payer: Self-pay | Admitting: Student

## 2021-06-09 DIAGNOSIS — R55 Syncope and collapse: Secondary | ICD-10-CM

## 2021-06-09 DIAGNOSIS — I10 Essential (primary) hypertension: Secondary | ICD-10-CM | POA: Diagnosis not present

## 2021-06-09 MED ORDER — CYANOCOBALAMIN 1000 MCG PO TABS: 1000.0000 ug | ORAL_TABLET | Freq: Every day | ORAL | 1 refills | Status: AC

## 2021-06-10 ENCOUNTER — Encounter: Payer: Self-pay | Admitting: *Deleted

## 2021-06-10 NOTE — Progress Notes (Signed)
Patient ID: Autumn Young, female   DOB: [birth year], 76 y.o.   MRN: [id number] ?Patient enrolled for [employer] to ship a 30 day cardiac event monitor to her address on file. ?Letter with instructions mailed to patient. ?

## 2021-06-12 NOTE — Discharge Summary (Signed)
?Physician Discharge Summary ?  ?Patient: Autumn Young MRN: [id number] DOB: Aug 01, 1945  ?Admit date:     06/06/2021  ?Discharge date: 06/09/2021  ?Discharge Physician: Kathlen Mody  ? ?PCP: Patient, No Pcp Per (Inactive)  ? ?Recommendations at discharge:  ?Please follow up with PCP in one week.  ?Please follow up with neurology for evaluation of parkinson's disease ?Please follow up with cardiology for Holter monitor placement.  ? ?Discharge Diagnoses: ?Principal Problem: ?  Paresthesia ?Active Problems: ?  Syncope ?  History of CVA (cerebrovascular accident) ?  COPD (chronic obstructive pulmonary disease) with chronic bronchitis (HCC) ?  Essential hypertension ?  Mixed hyperlipidemia ?  OSA (obstructive sleep apnea) ? ? ? ?Hospital Course: ? ?Autumn Young is a 76 y.o. female with medical history significant of CVA on Plavix, CAD, hypertension, pulmonary hypertension, COPD, asthma, OSA not on CPAP who presents with concerns of syncope. ?Pt reports that she had her 5 year grand child in her lap, felt nauseated and started throwing up , started shaking her head and reports that she was going in and out of consciousness.  ? ? ?Assessment and Plan: ? ?Syncope ?-presented with episode of questionable syncope  with loss of consciousness in and out , with head shaking. Differential includes complex migraine, CVA/TIA, seizure, vitamin deficiency ?-CT head and CTA head and neck negative for LVO. MRI brain is negative for acute CVA.  ?-Echocardiogram shows preserved LVEF, normal function, no regional wall abn, grade 2 diastolic dysf.  ?-vit b12 normal low, supplementation added.  ?Eeg is negative.  ?-continue home Plavix  ?- overnight telemetry monitor show bradycardia with some non conducting p waves/vs second degree heart block.  Cardiology consulted and recommendations given.  ?- 30 day monitor requested.  ?- TSH elevated, but free T4 and T3 are wnl.  ?- hemoglobin A1c is 5.8% ?- lipid panel shows LDL of 129 ?- she reports  her head shaking has worsened in the last 3 years, has h/o Parkinson's disease in her father and uncles.she also family history of seizure.  ?- she hasn't seen a PCP in over 4 years.  ?She will need TOC consult for PCP and ambulatory referral to neurology for her evaluation of parkinson's disease.  ?  ?History of CVA (cerebrovascular accident) ?Continue Plavix. DAPT d/c by cardiology in 2019. ?  ?OSA (obstructive sleep apnea) ?Not compliant with CPAP ?  ?Mixed hyperlipidemia ?Not on statin. Pt declined in the past.  ?  ?Essential hypertension ?BP off meds optimal.  ?At home on norvasc, chlorthalidone and benicar.  ?Will d/c norvasc and chlorthalidone for now.  ?Resume benicar on discharge.  ?  ?COPD (chronic obstructive pulmonary disease) with chronic bronchitis (HCC) ?No wheezing heard.  ?  ?  ?Bradycardia:  ?Not on any B blockers.  ?  ?Hypokalemia:  ?Replaced.  ?  ?  ?Mild normocytic anemia:  ?Monitor.  ?  ?Estimated body mass index is 48.42 kg/m? as calculated from the following: ?  Height as of this encounter: 5\' 6"  (1.676 m). ?  Weight as of this encounter: 136.1 kg. ? ?  ? ? ?Consultants: none.  ?Procedures performed: none.   ?Disposition: Home ?Diet recommendation:  ?Discharge Diet Orders (From admission, onward)  ? ?  Start     Ordered  ? 06/09/21 0000  Diet - low sodium heart healthy       ? 06/09/21 1139  ? ?  ?  ? ?  ? ?Regular diet ?DISCHARGE MEDICATION: ?Allergies as of  06/09/2021   ? ?   Reactions  ? Shellfish Allergy Anaphylaxis  ? Simvastatin Swelling  ? Lasix [furosemide] Swelling  ? Latex   ? rash  ? Penicillins Rash  ? Has patient had a PCN reaction causing immediate rash, facial/tongue/throat swelling, SOB or lightheadedness with hypotension: Unknown ?Has patient had a PCN reaction causing severe rash involving mucus membranes or skin necrosis: No ?Has patient had a PCN reaction that required hospitalization: No ?Has patient had a PCN reaction occurring within the last 10 years: No ?If all of the  above answers are "NO", then may proceed with Cephalosporin use.  ? ?  ? ?  ?Medication List  ?  ? ?STOP taking these medications   ? ?amLODipine 5 MG tablet ?Commonly known as: NORVASC ?  ?chlorthalidone 25 MG tablet ?Commonly known as: HYGROTON ?  ?potassium chloride 10 MEQ tablet ?Commonly known as: KLOR-CON ?  ? ?  ? ?TAKE these medications   ? ?albuterol 108 (90 Base) MCG/ACT inhaler ?Commonly known as: Proventil HFA ?Inhale 2 puffs into the lungs every 6 (six) hours as needed for wheezing or shortness of breath. ?  ?ascorbic acid 500 MG tablet ?Commonly known as: VITAMIN C ?Take 1,000 mg by mouth daily. ?  ?Breo Ellipta 200-25 MCG/ACT Aepb ?Generic drug: fluticasone furoate-vilanterol ?Inhale 1 puff into the lungs daily. ?  ?Cholecalciferol 125 MCG (5000 UT) Tabs ?Take 5,000 Units by mouth daily. ?  ?clopidogrel 75 MG tablet ?Commonly known as: PLAVIX ?Take 75 mg by mouth daily. ?  ?cyanocobalamin 1000 MCG tablet ?Take 1 tablet (1,000 mcg total) by mouth daily. ?  ?ezetimibe 10 MG tablet ?Commonly known as: ZETIA ?Take 10 mg by mouth daily. ?  ?montelukast 10 MG tablet ?Commonly known as: SINGULAIR ?Take 10 mg by mouth at bedtime. ?  ?olmesartan 20 MG tablet ?Commonly known as: BENICAR ?Take 20 mg by mouth at bedtime. ?  ?Omega-3 1000 MG Caps ?Take 1,000 mg by mouth daily. ?  ?pantoprazole 40 MG tablet ?Commonly known as: PROTONIX ?Take 40 mg by mouth 2 (two) times daily. ?  ?Spiriva Respimat 1.25 MCG/ACT Aers ?Generic drug: Tiotropium Bromide Monohydrate ?Inhale 2 puffs into the lungs daily. ?  ? ?  ? ? Follow-up Information   ? ? Georgina Quint, MD. Go on 08/06/2021.   ?Specialty: Internal Medicine ?Why: appointment is scheduled for Tuesday Aug 06, 2021 at 11 am to establish for primary care ?Contact information: ?[address] ?[place] [address] ?[phone] ? ? ?  ?  ? ?  ?  ? ?  ? ?Discharge Exam: ?Ceasar Mons Weights  ? 06/06/21 1721  ?Weight: 136.1 kg  ? ?General exam: Appears calm and  comfortable  ?Respiratory system: Clear to auscultation. Respiratory effort normal. ?Cardiovascular system: S1 & S2 heard, RRR. No JVD, murmurs, rubs, gallops or clicks. No pedal edema. ?Gastrointestinal system: Abdomen is nondistended, soft and nontender. No organomegaly or masses felt. Normal bowel sounds heard. ?Central nervous system: Alert and oriented. No focal neurological deficits. ?Extremities: Symmetric 5 x 5 power. ?Skin: No rashes, lesions or ulcers ?Psychiatry: Judgement and insight appear normal. Mood & affect appropriate.  ? ? ?Condition at discharge: fair ? ?The results of significant diagnostics from this hospitalization (including imaging, microbiology, ancillary and laboratory) are listed below for reference.  ? ?Imaging Studies: ?CT ANGIO HEAD NECK W WO CM ? ?Result Date: 06/06/2021 ?CLINICAL DATA:  Initial evaluation for acute head trauma, syncope. EXAM: CT ANGIOGRAPHY HEAD AND NECK TECHNIQUE: Multidetector  CT imaging of the head and neck was performed using the standard protocol during bolus administration of intravenous contrast. Multiplanar CT image reconstructions and MIPs were obtained to evaluate the vascular anatomy. Carotid stenosis measurements (when applicable) are obtained utilizing NASCET criteria, using the distal internal carotid diameter as the denominator. RADIATION DOSE REDUCTION: This exam was performed according to the departmental dose-optimization program which includes automated exposure control, adjustment of the mA and/or kV according to patient size and/or use of iterative reconstruction technique. CONTRAST:  75mL OMNIPAQUE IOHEXOL 350 MG/ML SOLN COMPARISON:  Prior Study from 11/20/2014. FINDINGS: CT HEAD FINDINGS Brain: Age-related cerebral atrophy with mild chronic small vessel ischemic disease. No acute intracranial hemorrhage. No acute large vessel territory infarct. No mass lesion or midline shift. No hydrocephalus or extra-axial fluid collection. Vascular: No  hyperdense vessel. Scattered vascular calcifications noted within the carotid siphons. Skull: Scalp soft tissues within normal limits.  Calvarium intact. Sinuses: Paranasal sinuses are clear. Trace right mastoid ef

## 2021-06-28 ENCOUNTER — Encounter: Payer: Self-pay | Admitting: Neurology

## 2021-07-24 ENCOUNTER — Ambulatory Visit: Payer: Medicare HMO | Admitting: Neurology

## 2021-07-24 ENCOUNTER — Encounter: Payer: Self-pay | Admitting: Cardiology

## 2021-07-24 ENCOUNTER — Encounter: Payer: Self-pay | Admitting: Neurology

## 2021-07-24 ENCOUNTER — Ambulatory Visit (INDEPENDENT_AMBULATORY_CARE_PROVIDER_SITE_OTHER): Payer: Medicare HMO

## 2021-07-24 VITALS — BP 155/75 | HR 60 | Ht 66.0 in | Wt 282.4 lb

## 2021-07-24 DIAGNOSIS — R55 Syncope and collapse: Secondary | ICD-10-CM

## 2021-07-24 DIAGNOSIS — I441 Atrioventricular block, second degree: Secondary | ICD-10-CM

## 2021-07-24 DIAGNOSIS — G250 Essential tremor: Secondary | ICD-10-CM | POA: Diagnosis not present

## 2021-07-24 NOTE — Patient Instructions (Signed)
Good to meet you. ? ?Please start wearing the heart monitor ? ?2. Schedule appointment with Cardiology ? ?3. It is prudent to recommend that all persons should be free of syncopal episodes for at least six months to be granted the driving privilege." (THE [place] PHYSICIAN'S GUIDE TO DRIVER MEDICAL EVALUATION, Second Edition, Medical Review Branch, Associate Professor, Division of [employer], July 2004) ?  ?4. Follow-up in 4 months, call for any changes ?

## 2021-07-24 NOTE — Progress Notes (Signed)
? ?NEUROLOGY CONSULTATION NOTE ? ?Autumn Young ?MRN: [id number] ?DOB: 1945-07-05 ? ?Referring provider: Dr. Kathlen Mody ?Primary care provider: Stephens November, PA-C ? ?Reason for consult:  syncope ? ?Dear Dr Blake Divine: ? ?Thank you for your kind referral of KATHI DOHN for consultation of the above symptoms. Although her history is well known to you, please allow me to reiterate it for the purpose of our medical record. Her 78 year old granddaughter Jethro Bastos is present during the visit. Records and images were personally reviewed where available. ? ? ?HISTORY OF PRESENT ILLNESS: ?This is a pleasant 76 year old right-handed woman with a history of hypertension, CAD, CKD, hyperlipidemia, COPD, obesity, presenting for evaluation of syncope and tremor. The first syncopal episode occurred on 06/06/21, she was at home doing a number game with her granddaughter when all of a sudden she had had "hard shaking" different from her typical tremor, she could see her body shaking real fast and could her hear her granddaughter saying she was scared and she could not see her grandmother's eyes. She called to her housemate to call 911, she continued to see her body shaking and then vomited. She felt like she was out of breath, "like I did not have air," and thinks she lost consciousness, feeling herself go away and when she came back she could still see her body shaking. No diaphoresis, tongue bite, or incontinence. Her granddaughter got her the phone and the patient dialed 911 and spoke to EMS. She had a headache and when she lay her head back, head was swimming. When they arrived, she was still vomiting but the hard shaking was gone. She had a spinning sensation when she lay on her back. She was "bobbing around" when they tried to stand her up, she was diffusely weak. No tongue bite or incontinence. She had diarrhea in the ER and while in the hospital, she reported feeling ill the past month with intermittent nausea and vomiting,  however today states that she had been feeling fine that day and had eaten breakfast earlier in the morning. She had a brain MRI without contrast which I personally reviewed, no acute changes. CTA head and neck no significant stenosis. Her routine EEG was normal. Echocardiogram showed an EF of 60-65% with grade II diastolic dysfunction. She had bradycardia with second degree AV block on telemetry. During her admission, she reported the head tremor had been ongoing for 3 years and had been worsening, so she was referred to Neurology.  ? ?She reports having another milder episode 2 weeks ago while her other granddaughter was sitting on her lap. She recalls that she started coughing then passed out for a minute, when she came to, her granddaughter was slapping her face saying she could not see her eyes. She did not have the hard shaking, vomiting, difficulty breathing, dizziness, or fatigue she had that time so she did not seek medical attention. She lives with a housemate and her granddaughter. She has not been told of any staring/unresponsive episodes, she denies any other gaps in time, no olfactory/gustatory hallucinations, focal numbness/tingling/weakness, myoclonic jerks. She has had intermittent headaches. Since the incident in March, every time she lays down at night, she has vertigo, she turns over or stands up and it resolves, no nausea/vomiting. No diplopia, dysarthria/dysphagia, neck/back pain, constipation, anosmia. She has right shoulder pain. She has noticed irregular palpitations for a time now, she has the heart monitor equipment but has not worn it yet. Head tremors started around  3 years ago. Her right hand is also affected, affecting writing and using utensils. She states her left hand and legs are not affected. Her maternal grandfather, maternal aunt, and son "shook all the time." Her paternal grandfather had shaking, her older sister is beginning to shake a little bit. Her son had seizures in  childhood and passed away from a seizure. She denies any significant head injuries, CNS infections.  ? ? ?PAST MEDICAL HISTORY: ?Past Medical History:  ?Diagnosis Date  ? Asthma   ? COPD (chronic obstructive pulmonary disease) (HCC)   ? GERD (gastroesophageal reflux disease)   ? Hypertension   ? Stroke [facility](HCC)   ? ? ?PAST SURGICAL HISTORY: ?Past Surgical History:  ?Procedure Laterality Date  ? TUBAL LIGATION    ? ? ?MEDICATIONS: ?Current Outpatient Medications on File Prior to Visit  ?Medication Sig Dispense Refill  ? albuterol (PROVENTIL HFA) 108 (90 Base) MCG/ACT inhaler Inhale 2 puffs into the lungs every 6 (six) hours as needed for wheezing or shortness of breath. 18 g 0  ? ascorbic acid (VITAMIN C) 500 MG tablet Take 1,000 mg by mouth daily.    ? BREO ELLIPTA 200-25 MCG/ACT AEPB Inhale 1 puff into the lungs daily.    ? Cholecalciferol 125 MCG (5000 UT) TABS Take 5,000 Units by mouth daily.    ? clopidogrel (PLAVIX) 75 MG tablet Take 75 mg by mouth daily.    ? ezetimibe (ZETIA) 10 MG tablet Take 10 mg by mouth daily.    ? montelukast (SINGULAIR) 10 MG tablet Take 10 mg by mouth at bedtime.    ? olmesartan (BENICAR) 20 MG tablet Take 20 mg by mouth at bedtime.    ? Omega-3 1000 MG CAPS Take 1,000 mg by mouth daily.    ? pantoprazole (PROTONIX) 40 MG tablet Take 40 mg by mouth 2 (two) times daily.     ? pyridOXINE (B-6) 50 MG tablet Take 1 tablet by mouth daily.    ? SPIRIVA RESPIMAT 1.25 MCG/ACT AERS Inhale 2 puffs into the lungs daily.    ? vitamin B-12 1000 MCG tablet Take 1 tablet (1,000 mcg total) by mouth daily. 30 tablet 1  ? ?No current facility-administered medications on file prior to visit.  ? ? ?ALLERGIES: ?Allergies  ?Allergen Reactions  ? Shellfish Allergy Anaphylaxis  ? Simvastatin Swelling  ? Lasix [Furosemide] Swelling  ? Latex   ?  rash  ? Penicillins Rash  ?  Has patient had a PCN reaction causing immediate rash, facial/tongue/throat swelling, SOB or lightheadedness with hypotension: Unknown ?Has  patient had a PCN reaction causing severe rash involving mucus membranes or skin necrosis: No ?Has patient had a PCN reaction that required hospitalization: No ?Has patient had a PCN reaction occurring within the last 10 years: No ?If all of the above answers are "NO", then may proceed with Cephalosporin use. ?  ? ? ?FAMILY HISTORY: ?Family History  ?Problem Relation Age of Onset  ? Cancer Mother   ? Hypertension Father   ? Cancer Sister   ? Diabetes Sister   ? Hypertension Sister   ? Heart disease Sister   ? Cancer Brother   ? Hypertension Brother   ? ? ?SOCIAL HISTORY: ?Social History  ? ?Socioeconomic History  ? Marital status: Divorced  ?  Spouse name: Not on file  ? Number of children: Not on file  ? Years of education: Not on file  ? Highest education level: Not on file  ?Occupational History  ?  Not on file  ?Tobacco Use  ? Smoking status: Never  ? Smokeless tobacco: Never  ?Vaping Use  ? Vaping Use: Never used  ?Substance and Sexual Activity  ? Alcohol use: No  ? Drug use: No  ? Sexual activity: Not on file  ?Other Topics Concern  ? Not on file  ?Social History Narrative  ? Right handed   ? Lives with a friend   ? ?Social Determinants of Health  ? ?Financial Resource Strain: Not on file  ?Food Insecurity: Not on file  ?Transportation Needs: Not on file  ?Physical Activity: Not on file  ?Stress: Not on file  ?Social Connections: Not on file  ?Intimate Partner Violence: Not on file  ? ? ? ?PHYSICAL EXAM: ?Vitals:  ? 07/24/21 0849  ?BP: (!) 155/75  ?Pulse: 60  ?SpO2: 95%  ? ?General: No acute distress ?Head:  Normocephalic/atraumatic ?Skin/Extremities: No rash, no edema ?Neurological Exam: ?Mental status: alert and oriented to person, place, and time, no dysarthria or aphasia, Fund of knowledge is appropriate. Attention and concentration are normal.    ?Cranial nerves: ?CN I: not tested ?CN II: pupils equal, round and reactive to light, visual fields intact ?CN III, IV, VI:  full range of motion, no nystagmus,  no ptosis ?CN V: facial sensation intact ?CN VII: upper and lower face symmetric ?CN VIII: hearing intact to conversation ?Bulk & Tone: normal, no cogwheeling, no fasciculations. ?Motor: 5/5 throughout with no p

## 2021-07-25 ENCOUNTER — Ambulatory Visit: Payer: Medicare HMO | Admitting: Cardiology

## 2021-07-25 ENCOUNTER — Encounter: Payer: Self-pay | Admitting: Cardiology

## 2021-07-25 VITALS — BP 128/75 | HR 66 | Ht 66.0 in | Wt 285.8 lb

## 2021-07-25 DIAGNOSIS — R55 Syncope and collapse: Secondary | ICD-10-CM | POA: Diagnosis not present

## 2021-07-25 NOTE — Patient Instructions (Addendum)
Medication Instructions:  ?Your Physician recommend you continue on your current medication as directed.   ? ?*If you need a refill on your cardiac medications before your next appointment, please call your pharmacy* ? ? ?Lab Work: ?None ordered today ? ? ?Testing/Procedures: ?None ordered today ? ? ?Follow-Up: ?At [facility], you and your health needs are our priority.  As part of our continuing mission to provide you with exceptional heart care, we have created designated Provider Care Teams.  These Care Teams include your primary Cardiologist (physician) and Advanced Practice Providers (APPs -  Physician Assistants and Nurse Practitioners) who all work together to provide you with the care you need, when you need it. ? ?We recommend signing up for the patient portal called "MyChart".  Sign up information is provided on this After Visit Summary.  MyChart is used to connect with patients for Virtual Visits (Telemedicine).  Patients are able to view lab/test results, encounter notes, upcoming appointments, etc.  Non-urgent messages can be sent to your provider as well.   ?To learn more about what you can do with MyChart, go to [email or web address].   ? ?Your next appointment:   ?As needed ? ?The format for your next appointment:   ?In Person ? ?Provider:   ?Rollene Rotunda, MD { ? ? ?Important Information About Sugar ? ? ? ? ? ? ?

## 2021-07-25 NOTE — Progress Notes (Signed)
?  ?Cardiology Office Note ? ? ?Date:  07/25/2021  ? ?ID:  Autumn Young, DOB [birth year], MRN [id number] ? ?PCP:  Patient, No Pcp Per (Inactive)  ?Cardiologist:   Rollene Rotunda, MD ?Referring:   Van Clines, MD ? ?Chief Complaint  ?Patient presents with  ? Loss of Consciousness  ? ? ?  ?History of Present Illness: ?Autumn Young is a 76 y.o. female who presents for evaluation of syncope.  She saw Dr. Karel Jarvis.  I reviewed these records for this visit.    She was in the hospital in March of this year with paresthesia and syncope.  Head CT was negative.  MRI demonstrated no acute findings.  EEG was unremarkable.  Labs were unremarkable.  She did have an echocardiogram last month with an EF of 60 to 65%.  There was aortic valve sclerosis but no stenosis.  There were no significant valvular abnormalities.   Carotid demonstrated minimal aortic sclerosis.  She was wearing an event monitor.   ? ?She reports that.  She felt shaky all over.  She coughed 2 or 3 times and then just "faded away."  She laid down and apparently had complete loss of consciousness until her granddaughter entered the phone and she called 911.  She did report a milder episode without syncope a few weeks ago.  She has a resting tremor is being evaluated.  There is a question whether she would tolerate primidone or beta-blocker.  Of note she did have rhythm strips with one probably blocked PAC but no high risk arrhythmias. ? ?She was seen in the health system by cardiology in 2020.  I did review these results.  She has a questionable history of prior CVA.  She has a history of heart failure with preserved ejection fraction, hypertension and dyslipidemia and mild pulmonary hypertension.  He has been on Plavix for his CVA.  She had shortness of breath thought to be related to hypoventilation obesity syndrome and sleep apnea.   ? ?Past Medical History:  ?Diagnosis Date  ? Asthma   ? COPD (chronic obstructive pulmonary disease) (HCC)   ? GERD  (gastroesophageal reflux disease)   ? Hypertension   ? Stroke [facility])   ? ? ?Past Surgical History:  ?Procedure Laterality Date  ? TUBAL LIGATION    ? ? ? ?Current Outpatient Medications  ?Medication Sig Dispense Refill  ? albuterol (PROVENTIL HFA) 108 (90 Base) MCG/ACT inhaler Inhale 2 puffs into the lungs every 6 (six) hours as needed for wheezing or shortness of breath. 18 g 0  ? ascorbic acid (VITAMIN C) 500 MG tablet Take 1,000 mg by mouth daily.    ? BREO ELLIPTA 200-25 MCG/ACT AEPB Inhale 1 puff into the lungs daily.    ? Cholecalciferol 125 MCG (5000 UT) TABS Take 5,000 Units by mouth daily.    ? clopidogrel (PLAVIX) 75 MG tablet Take 75 mg by mouth daily.    ? ezetimibe (ZETIA) 10 MG tablet Take 10 mg by mouth daily.    ? montelukast (SINGULAIR) 10 MG tablet Take 10 mg by mouth at bedtime.    ? olmesartan (BENICAR) 20 MG tablet Take 20 mg by mouth at bedtime.    ? Omega-3 1000 MG CAPS Take 1,000 mg by mouth daily.    ? pantoprazole (PROTONIX) 40 MG tablet Take 40 mg by mouth 2 (two) times daily.     ? pyridOXINE (B-6) 50 MG tablet Take 1 tablet by mouth daily.    ?  SPIRIVA RESPIMAT 1.25 MCG/ACT AERS Inhale 2 puffs into the lungs daily.    ? vitamin B-12 1000 MCG tablet Take 1 tablet (1,000 mcg total) by mouth daily. 30 tablet 1  ? ?No current facility-administered medications for this visit.  ? ? ?Allergies:   Shellfish allergy, Simvastatin, Lasix [furosemide], Latex, and Penicillins  ? ? ?Social History:  The patient  reports that she has never smoked. She has never used smokeless tobacco. She reports that she does not drink alcohol and does not use drugs.  ? ?Family History:  The patient's family history includes Cancer in her brother, mother, and sister; Diabetes in her sister; Heart disease in her sister; Hypertension in her brother, father, and sister.  ? ? ?ROS:  Please see the history of present illness.   Otherwise, review of systems are positive for none.   All other systems are reviewed and  negative.  ? ? ?PHYSICAL EXAM: ?VS:  BP 128/75   Pulse 66   Ht 5\' 6"  (1.676 m)   Wt 285 lb 12.8 oz (129.6 kg)   SpO2 95%   BMI 46.13 kg/m?  , BMI Body mass index is 46.13 kg/m?. ?GENERAL:  Well appearing ?HEENT:  Pupils equal round and reactive, fundi not visualized, oral mucosa unremarkable ?NECK:  No jugular venous distention, waveform within normal limits, carotid upstroke brisk and symmetric, no bruits, no thyromegaly ?LYMPHATICS:  No cervical, inguinal adenopathy ?LUNGS:  Clear to auscultation bilaterally ?BACK:  No CVA tenderness ?CHEST:  Unremarkable ?HEART:  PMI not displaced or sustained,S1 and S2 within normal limits, no S3, no S4, no clicks, no rubs, no murmurs ?ABD:  Flat, positive bowel sounds normal in frequency in pitch, no bruits, no rebound, no guarding, no midline pulsatile mass, no hepatomegaly, no splenomegaly ?EXT:  2 plus pulses throughout, no edema, no cyanosis no clubbing ?SKIN:  No rashes no nodules ?NEURO:  Cranial nerves II through XII grossly intact, motor grossly intact throughout ?PSYCH:  Cognitively intact, oriented to person place and time ? ? ? ?EKG:  EKG is ordered today. ?The ekg ordered today demonstrates normal sinus rhythm, rate 66, axis within normal limits, intervals within normal limits, no acute ST-T wave changes. ? ? ?Recent Labs: ?06/06/2021: ALT 25 ?06/07/2021: TSH 10.269 ?06/08/2021: BUN 14; Creatinine, Ser 1.08; Hemoglobin 11.6; Platelets 260; Potassium 3.5; Sodium 136  ? ? ?Lipid Panel ?   ?Component Value Date/Time  ? CHOL 197 06/07/2021 0401  ? TRIG 153 (H) 06/07/2021 0401  ? HDL 37 (L) 06/07/2021 0401  ? CHOLHDL 5.3 06/07/2021 0401  ? VLDL 31 06/07/2021 0401  ? LDLCALC 129 (H) 06/07/2021 0401  ? ?  ? ?Wt Readings from Last 3 Encounters:  ?07/25/21 285 lb 12.8 oz (129.6 kg)  ?07/24/21 282 lb 6.4 oz (128.1 kg)  ?06/06/21 300 lb (136.1 kg)  ?  ? ? ?Other studies Reviewed: ?Additional studies/ records that were reviewed today include: Extensive review of hospital  records and neurology records. ?Review of the above records demonstrates:  Please see elsewhere in the note.   ? ? ?ASSESSMENT AND PLAN: ? ?Syncope: The etiology of this is not clear.  This could be arrhythmia mediated as there is not clearly a neurologic or vascular.  However, there was not a clear etiology identified when she was on a monitor in the hospital.  I do not strongly suspect dysrhythmia and I think she probably would tolerate beta-blockers if it would help her tremors.  I would like to see the  results of the monitor prior to that. ? ?Hypertension: ? ?Sleep apnea: She wakes up rested.  She does not like to wear CPAP.  She does have a machine. ? ? ?Current medicines are reviewed at length with the patient today.  The patient does not have concerns regarding medicines. ? ?The following changes have been made:  no change ? ?Labs/ tests ordered today include:  ? ?Orders Placed This Encounter  ?Procedures  ? EKG 12-Lead  ? ? ? ?Disposition:   FU with me as needed based on the results of the monitor ? ? ?Signed, ?Rollene RotundaJames Conor Lata, MD  ?07/25/2021 4:53 PM    ?[facility] ? ? ? ?

## 2021-07-29 ENCOUNTER — Telehealth: Payer: Self-pay | Admitting: *Deleted

## 2021-07-29 NOTE — Telephone Encounter (Signed)
[employer] has been contacted to send patient additional strips. ?

## 2021-07-29 NOTE — Telephone Encounter (Signed)
-----   Message from [name], RN sent at 07/25/2021  4:51 PM EDT ----- ?This pt is currently wearing a [employer] monitor and have 3 weeks left but is running out of strips. Are we able to provide her some? ? ?Autumn Young ? ?

## 2021-08-01 ENCOUNTER — Telehealth: Payer: Self-pay | Admitting: Physician Assistant

## 2021-08-01 NOTE — Telephone Encounter (Signed)
Correction to previous note. ? ?Time for event was 5:23 AM. ? ?Will scan to chart. ?

## 2021-08-01 NOTE — Telephone Encounter (Signed)
Monitor company called, pt had 7 sec 3rd deg AVB and sinus brady at 6:30 am today >> strips sent in. ? ?No sx reported. ? ?Theodore Demark, PA-C ?08/01/2021 ?7:18 AM ? ?

## 2021-08-06 ENCOUNTER — Encounter: Payer: Self-pay | Admitting: Emergency Medicine

## 2021-08-06 ENCOUNTER — Ambulatory Visit (INDEPENDENT_AMBULATORY_CARE_PROVIDER_SITE_OTHER): Payer: Medicare HMO | Admitting: Emergency Medicine

## 2021-08-06 VITALS — BP 134/82 | HR 64 | Temp 97.7°F | Ht 66.0 in | Wt 283.2 lb

## 2021-08-06 DIAGNOSIS — Z7689 Persons encountering health services in other specified circumstances: Secondary | ICD-10-CM | POA: Diagnosis not present

## 2021-08-06 DIAGNOSIS — J449 Chronic obstructive pulmonary disease, unspecified: Secondary | ICD-10-CM

## 2021-08-06 DIAGNOSIS — I10 Essential (primary) hypertension: Secondary | ICD-10-CM | POA: Diagnosis not present

## 2021-08-06 DIAGNOSIS — E785 Hyperlipidemia, unspecified: Secondary | ICD-10-CM

## 2021-08-06 DIAGNOSIS — I272 Pulmonary hypertension, unspecified: Secondary | ICD-10-CM

## 2021-08-06 DIAGNOSIS — Z8673 Personal history of transient ischemic attack (TIA), and cerebral infarction without residual deficits: Secondary | ICD-10-CM

## 2021-08-06 NOTE — Assessment & Plan Note (Signed)
Well-controlled hypertension.  Continue olmesartan 20 mg daily. ?BP Readings from Last 3 Encounters:  ?08/06/21 134/82  ?07/25/21 128/75  ?07/24/21 (!) 155/75  ? ? ?

## 2021-08-06 NOTE — Progress Notes (Signed)
Michail Jewels ?76 y.o. ? ? ?Chief Complaint  ?Patient presents with  ? New Patient (Initial Visit)  ?  No concerns  ? ? ?HISTORY OF PRESENT ILLNESS: ?This is a 76 y.o. female first visit to this office, here to establish care with me. ?Has the following chronic medical problems: ?1.  Hypertension ?2.  History of stroke ?3.  COPD ?4.  Dyslipidemia ?Recently seen by cardiologist, assessment and plan as follows: ? ?ASSESSMENT AND PLAN: ?  ?Syncope: The etiology of this is not clear.  This could be arrhythmia mediated as there is not clearly a neurologic or vascular.  However, there was not a clear etiology identified when she was on a monitor in the hospital.  I do not strongly suspect dysrhythmia and I think she probably would tolerate beta-blockers if it would help her tremors.  I would like to see the results of the monitor prior to that. ?  ?Hypertension: ?  ?Sleep apnea: She wakes up rested.  She does not like to wear CPAP.  She does have a machine. ?  ?  ?Current medicines are reviewed at length with the patient today.  The patient does not have concerns regarding medicines. ?  ?The following changes have been made:  no change ?  ?Labs/ tests ordered today include:  ?  ?   ?Orders Placed This Encounter  ?Procedures  ? EKG 12-Lead  ?  ?  ?  ?Disposition:   FU with me as needed based on the results of the monitor ?  ?  ?Signed, ?Minus Breeding, MD  ?07/25/2021 4:53 PM    ?[place] ?  ?  ? ?HPI ? ? ?Prior to Admission medications   ?Medication Sig Start Date End Date Taking? Authorizing Provider  ?albuterol (PROVENTIL HFA) 108 (90 Base) MCG/ACT inhaler Inhale 2 puffs into the lungs every 6 (six) hours as needed for wheezing or shortness of breath. 08/13/17  Yes Hongalgi, Lenis Dickinson, MD  ?ascorbic acid (VITAMIN C) 500 MG tablet Take 1,000 mg by mouth daily. 05/14/18  Yes [provider]  ?Adair Patter 200-25 MCG/ACT AEPB Inhale 1 puff into the lungs daily. 05/18/21  Yes [provider]  ?Cholecalciferol 125 MCG (5000 UT) TABS Take 5,000 Units by mouth daily.   Yes [provider]  ?clopidogrel (PLAVIX) 75 MG tablet Take 75 mg by mouth daily. 11/26/15  Yes [provider]  ?ezetimibe (ZETIA) 10 MG tablet Take 10 mg by mouth daily. 05/24/21  Yes [provider]  ?montelukast (SINGULAIR) 10 MG tablet Take 10 mg by mouth at bedtime. 03/26/21  Yes [provider]  ?olmesartan (BENICAR) 20 MG tablet Take 20 mg by mouth at bedtime. 04/21/21  Yes [provider]  ?Omega-3 1000 MG CAPS Take 1,000 mg by mouth daily.   Yes [provider]  ?pantoprazole (PROTONIX) 40 MG tablet Take 40 mg by mouth 2 (two) times daily.  09/27/15  Yes [provider]  ?pyridOXINE (B-6) 50 MG tablet Take 1 tablet by mouth daily.   Yes [provider]  ?SPIRIVA RESPIMAT 1.25 MCG/ACT AERS Inhale 2 puffs into the lungs daily. 05/13/21  Yes [provider]  ?vitamin B-12 1000 MCG tablet Take 1 tablet (1,000 mcg total) by mouth daily. 06/10/21 08/09/21 Yes Hosie Poisson, MD  ? ? ?Allergies  ?Allergen Reactions  ? Shellfish Allergy Anaphylaxis  ? Simvastatin Swelling  ? Lasix [Furosemide] Swelling  ? Latex   ?  rash  ?  Penicillins Rash  ?  Has patient had a PCN reaction causing immediate rash, facial/tongue/throat swelling, SOB or lightheadedness with hypotension: Unknown ?Has patient had a PCN reaction causing severe rash involving mucus membranes or skin necrosis: No ?Has patient had a PCN reaction that required hospitalization: No ?Has patient had a PCN reaction occurring within the last 10 years: No ?If all of the above answers are "NO", then may proceed with Cephalosporin use. ?  ? ? ?Patient Active Problem List  ? Diagnosis Date Noted  ? Paresthesia 06/07/2021  ? Syncope 06/07/2021  ? History of CVA (cerebrovascular accident) 06/07/2021  ? COPD exacerbation ([place]) 08/10/2017  ? OSA (obstructive sleep apnea) 11/28/2015  ? COPD (chronic  obstructive pulmonary disease) with chronic bronchitis ([place]) 06/26/2015  ? Pulmonary hypertension ([place]) 06/26/2015  ? 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 Mild intermittent asthma without complication [id number]  ? Mixed hyperlipidemia 11/19/2014  ? CVA (cerebral vascular accident) ([place]) 11/18/2014  ? Essential hypertension 11/18/2014  ? ? ?Past Medical History:  ?Diagnosis Date  ? Asthma   ? COPD (chronic obstructive pulmonary disease) ([place])   ? GERD (gastroesophageal reflux disease)   ? Hypertension   ? Stroke [facility])   ? ? ?Past Surgical History:  ?Procedure Laterality Date  ? TUBAL LIGATION    ? ? ?Social History  ? ?Socioeconomic History  ? Marital status: Divorced  ?  Spouse name: Not on file  ? Number of children: Not on file  ? Years of education: Not on file  ? Highest education level: Not on file  ?Occupational History  ? Not on file  ?Tobacco Use  ? Smoking status: Never  ? Smokeless tobacco: Never  ?Vaping Use  ? Vaping Use: Never used  ?Substance and Sexual Activity  ? Alcohol use: No  ? Drug use: No  ? Sexual activity: Not on file  ?Other Topics Concern  ? Not on file  ?Social History Narrative  ? Lives with a friend.  3 children with one deceased.    ? ?Social Determinants of Health  ? ?Financial Resource Strain: Not on file  ?Food Insecurity: Not on file  ?Transportation Needs: Not on file  ?Physical Activity: Not on file  ?Stress: Not on file  ?Social Connections: Not on file  ?Intimate Partner Violence: Not on file  ? ? ?Family History  ?Problem Relation Age of Onset  ? Cancer Mother   ?     Lung, cervix, mouth  ? Hypertension Father   ? Cancer Sister   ?     Colon  ? Diabetes Sister   ? Hypertension Sister   ? Heart disease Sister   ? Cancer Brother   ? Hypertension Brother   ? ? ? ?Review of Systems  ?Constitutional: Negative.  Negative for chills and fever.  ?HENT: Negative.  Negative for congestion and sore throat.   ?Respiratory: Negative.  Negative for cough and shortness of  breath.   ?Cardiovascular: Negative.  Negative for chest pain and palpitations.  ?Gastrointestinal: Negative.  Negative for abdominal pain, diarrhea, nausea and vomiting.  ?Genitourinary: Negative.   ?Skin: Negative.  Negative for rash.  ?Neurological: Negative.  Negative for dizziness and headaches.  ?All other systems reviewed and are negative. ?Today's Vitals  ? 08/06/21 1114  ?BP: 134/82  ?Pulse: 64  ?Temp: 97.7 ?F (36.5 ?C)  ?TempSrc: Oral  ?SpO2: 94%  ?Weight: 283 lb 4 oz (128.5 kg)  ?Height: 5\' 6"  (1.676 m)  ? ?Body mass index is  45.72 kg/m?. ? ? ?Physical Exam ?Vitals reviewed.  ?Constitutional:   ?   Appearance: Normal appearance. She is obese.  ?HENT:  ?   Head: Normocephalic.  ?Eyes:  ?   Extraocular Movements: Extraocular movements intact.  ?   Pupils: Pupils are equal, round, and reactive to light.  ?Cardiovascular:  ?   Rate and Rhythm: Normal rate and regular rhythm.  ?   Pulses: Normal pulses.  ?   Heart sounds: Normal heart sounds.  ?Pulmonary:  ?   Effort: Pulmonary effort is normal.  ?   Breath sounds: Normal breath sounds.  ?Abdominal:  ?   Palpations: Abdomen is soft.  ?   Tenderness: There is no abdominal tenderness.  ?Musculoskeletal:     ?   General: Normal range of motion.  ?   Cervical back: No tenderness.  ?   Right lower leg: No edema.  ?   Left lower leg: No edema.  ?Lymphadenopathy:  ?   Cervical: No cervical adenopathy.  ?Skin: ?   General: Skin is warm and dry.  ?   Capillary Refill: Capillary refill takes less than 2 seconds.  ?Neurological:  ?   General: No focal deficit present.  ?   Mental Status: She is alert and oriented to person, place, and time.  ?Psychiatric:     ?   Mood and Affect: Mood normal.     ?   Behavior: Behavior normal.  ? ? ? ?ASSESSMENT & PLAN: ?A total of 52 minutes was spent with the patient and counseling/coordination of care regarding preparing for this visit, review of available medical records, review of multiple chronic medical problems under management,  review of all medications, review of most recent blood work results, education on nutrition, cardiovascular risks associated with hypertension, secondary prevention of strokes, prognosis, documentation and need for follo

## 2021-08-06 NOTE — Assessment & Plan Note (Addendum)
Stable and well-controlled.  Continue with Spiriva 2 puffs daily.  Breo Ellipta twice a day. ?

## 2021-08-06 NOTE — Assessment & Plan Note (Signed)
Secondary prevention discussed.  Continue Plavix 75 mg daily. 

## 2021-08-06 NOTE — Patient Instructions (Signed)
Health Maintenance After Age 78 After age 78, you are at a higher risk for certain long-term diseases and infections as well as injuries from falls. Falls are a major cause of broken bones and head injuries in people who are older than age 78. Getting regular preventive care can help to keep you healthy and well. Preventive care includes getting regular testing and making lifestyle changes as recommended by your health care provider. Talk with your health care provider about: Which screenings and tests you should have. A screening is a test that checks for a disease when you have no symptoms. A diet and exercise plan that is right for you. What should I know about screenings and tests to prevent falls? Screening and testing are the best ways to find a health problem early. Early diagnosis and treatment give you the best chance of managing medical conditions that are common after age 78. Certain conditions and lifestyle choices may make you more likely to have a fall. Your health care provider may recommend: Regular vision checks. Poor vision and conditions such as cataracts can make you more likely to have a fall. If you wear glasses, make sure to get your prescription updated if your vision changes. Medicine review. Work with your health care provider to regularly review all of the medicines you are taking, including over-the-counter medicines. Ask your health care provider about any side effects that may make you more likely to have a fall. Tell your health care provider if any medicines that you take make you feel dizzy or sleepy. Strength and balance checks. Your health care provider may recommend certain tests to check your strength and balance while standing, walking, or changing positions. Foot health exam. Foot pain and numbness, as well as not wearing proper footwear, can make you more likely to have a fall. Screenings, including: Osteoporosis screening. Osteoporosis is a condition that causes  the bones to get weaker and break more easily. Blood pressure screening. Blood pressure changes and medicines to control blood pressure can make you feel dizzy. Depression screening. You may be more likely to have a fall if you have a fear of falling, feel depressed, or feel unable to do activities that you used to do. Alcohol use screening. Using too much alcohol can affect your balance and may make you more likely to have a fall. Follow these instructions at home: Lifestyle Do not drink alcohol if: Your health care provider tells you not to drink. If you drink alcohol: Limit how much you have to: 0-1 drink a day for women. 0-2 drinks a day for men. Know how much alcohol is in your drink. In the [place] one drink equals one 12 oz bottle of beer (355 mL), one 5 oz glass of wine (148 mL), or one 1 oz glass of hard liquor (44 mL). Do not use any products that contain nicotine or tobacco. These products include cigarettes, chewing tobacco, and vaping devices, such as e-cigarettes. If you need help quitting, ask your health care provider. Activity  Follow a regular exercise program to stay fit. This will help you maintain your balance. Ask your health care provider what types of exercise are appropriate for you. If you need a cane or walker, use it as recommended by your health care provider. Wear supportive shoes that have nonskid soles. Safety  Remove any tripping hazards, such as rugs, cords, and clutter. Install safety equipment such as grab bars in bathrooms and safety rails on stairs. Keep rooms and walkways   well-lit. General instructions Talk with your health care provider about your risks for falling. Tell your health care provider if: You fall. Be sure to tell your health care provider about all falls, even ones that seem minor. You feel dizzy, tiredness (fatigue), or off-balance. Take over-the-counter and prescription medicines only as told by your health care provider. These include  supplements. Eat a healthy diet and maintain a healthy weight. A healthy diet includes low-fat dairy products, low-fat (lean) meats, and fiber from whole grains, beans, and lots of fruits and vegetables. Stay current with your vaccines. Schedule regular health, dental, and eye exams. Summary Having a healthy lifestyle and getting preventive care can help to protect your health and wellness after age 78. Screening and testing are the best way to find a health problem early and help you avoid having a fall. Early diagnosis and treatment give you the best chance for managing medical conditions that are more common for people who are older than age 78. Falls are a major cause of broken bones and head injuries in people who are older than age 78. Take precautions to prevent a fall at home. Work with your health care provider to learn what changes you can make to improve your health and wellness and to prevent falls. This information is not intended to replace advice given to you by your health care provider. Make sure you discuss any questions you have with your health care provider. Document Revised: 08/06/2020 Document Reviewed: 08/06/2020 Elsevier Patient Education  2023 [employer]  

## 2021-08-06 NOTE — Assessment & Plan Note (Signed)
Stable.  Diet and nutrition discussed.  Presently not taking any medication. ?

## 2021-09-04 ENCOUNTER — Institutional Professional Consult (permissible substitution): Payer: Medicare HMO | Admitting: Cardiology

## 2021-09-04 NOTE — Progress Notes (Deleted)
Electrophysiology Office Note:    Date:  09/04/2021   ID:  Autumn Young, DOB April 26, 1945, MRN [id number]  PCP:  Patient, No Pcp Per (Inactive)  [facility] Cardiologist:  Rollene Rotunda, MD  [facility] Electrophysiologist:  None   Referring MD: No ref. provider found   Chief Complaint: Syncope  History of Present Illness:    Autumn Young is a 76 y.o. female who presents for an evaluation of syncope at the request of Dr. Antoine Poche. Their medical history includes hypertension, stroke, COPD.  The patient was last seen by Dr. Antoine Poche on July 25, 2021.  She has been hospitalized in the past for syncope.  According to records review she coughed a few times prior to passing out and then "just faded away".  She is also experienced milder episodes.  She has been evaluated by neurology for the syncopal episodes but this has been unrevealing.  A heart monitor was ordered at the April 27 appointment by Dr. Antoine Poche.  This demonstrated complete heart block for which I am seeing her today.     Past Medical History:  Diagnosis Date   Asthma    COPD (chronic obstructive pulmonary disease) (HCC)    GERD (gastroesophageal reflux disease)    Hypertension    Stroke [facility])     Past Surgical History:  Procedure Laterality Date   TUBAL LIGATION      Current Medications: No outpatient medications have been marked as taking for the 09/04/21 encounter (Appointment) with Lanier Prude, MD.     Allergies:   Shellfish allergy, Simvastatin, Lasix [furosemide], Latex, and Penicillins   Social History   Socioeconomic History   Marital status: Divorced    Spouse name: Not on file   Number of children: Not on file   Years of education: Not on file   Highest education level: Not on file  Occupational History   Not on file  Tobacco Use   Smoking status: Never   Smokeless tobacco: Never  Vaping Use   Vaping Use: Never used  Substance and Sexual Activity   Alcohol use: No   Drug use: No    Sexual activity: Not on file  Other Topics Concern   Not on file  Social History Narrative   Lives with a friend.  3 children with one deceased.     Social Determinants of Health   Financial Resource Strain: Not on file  Food Insecurity: Not on file  Transportation Needs: Not on file  Physical Activity: Not on file  Stress: Not on file  Social Connections: Not on file     Family History: The patient's family history includes Cancer in her brother, mother, and sister; Diabetes in her sister; Heart disease in her sister; Hypertension in her brother, father, and sister.  ROS:   Please see the history of present illness.    All other systems reviewed and are negative.  EKGs/Labs/Other Studies Reviewed:    The following studies were reviewed today:  September 01, 2021 heart monitor personally reviewed Third-degree AV block noted with a junctional escape  EKG:  The ekg ordered today demonstrates ***   Recent Labs: 06/06/2021: ALT 25 06/07/2021: TSH 10.269 06/08/2021: BUN 14; Creatinine, Ser 1.08; Hemoglobin 11.6; Platelets 260; Potassium 3.5; Sodium 136  Recent Lipid Panel    Component Value Date/Time   CHOL 197 06/07/2021 0401   TRIG 153 (H) 06/07/2021 0401   HDL 37 (L) 06/07/2021 0401   CHOLHDL 5.3 06/07/2021 0401   VLDL  31 06/07/2021 0401   LDLCALC 129 (H) 06/07/2021 0401    Physical Exam:    VS:  There were no vitals taken for this visit.    Wt Readings from Last 3 Encounters:  08/06/21 283 lb 4 oz (128.5 kg)  07/25/21 285 lb 12.8 oz (129.6 kg)  07/24/21 282 lb 6.4 oz (128.1 kg)     GEN: *** Well nourished, well developed in no acute distress HEENT: Normal NECK: No JVD; No carotid bruits LYMPHATICS: No lymphadenopathy CARDIAC: ***RRR, no murmurs, rubs, gallops RESPIRATORY:  Clear to auscultation without rales, wheezing or rhonchi  ABDOMEN: Soft, non-tender, non-distended MUSCULOSKELETAL:  No edema; No deformity  SKIN: Warm and dry NEUROLOGIC:  Alert and  oriented x 3 PSYCHIATRIC:  Normal affect       ASSESSMENT:    No diagnosis found. PLAN:    In order of problems listed above:    [employer] CLS     Total time spent with patient today *** minutes. This includes reviewing records, evaluating the patient and coordinating care.  Medication Adjustments/Labs and Tests Ordered: Current medicines are reviewed at length with the patient today.  Concerns regarding medicines are outlined above.  No orders of the defined types were placed in this encounter.  No orders of the defined types were placed in this encounter.    Signed, Rossie Muskrat. Lalla Brothers, MD, [facility], [facility] 09/04/2021 6:10 AM    Electrophysiology [facility]

## 2021-09-12 ENCOUNTER — Encounter: Payer: Self-pay | Admitting: Cardiology

## 2021-09-12 ENCOUNTER — Ambulatory Visit: Payer: Medicare HMO | Admitting: Cardiology

## 2021-09-12 ENCOUNTER — Encounter: Payer: Self-pay | Admitting: *Deleted

## 2021-09-12 VITALS — BP 132/64 | HR 86 | Ht 66.0 in | Wt 292.0 lb

## 2021-09-12 DIAGNOSIS — I10 Essential (primary) hypertension: Secondary | ICD-10-CM

## 2021-09-12 DIAGNOSIS — R251 Tremor, unspecified: Secondary | ICD-10-CM | POA: Diagnosis not present

## 2021-09-12 DIAGNOSIS — I639 Cerebral infarction, unspecified: Secondary | ICD-10-CM | POA: Diagnosis not present

## 2021-09-12 DIAGNOSIS — Z01818 Encounter for other preprocedural examination: Secondary | ICD-10-CM

## 2021-09-12 DIAGNOSIS — R55 Syncope and collapse: Secondary | ICD-10-CM | POA: Diagnosis not present

## 2021-09-12 LAB — BASIC METABOLIC PANEL
BUN/Creatinine Ratio: 16 (ref 12–28)
BUN: 15 mg/dL (ref 8–27)
CO2: 29 mmol/L (ref 20–29)
Calcium: 9.2 mg/dL (ref 8.7–10.3)
Chloride: 102 mmol/L (ref 96–106)
Creatinine, Ser: 0.95 mg/dL (ref 0.57–1.00)
Glucose: 99 mg/dL (ref 70–99)
Potassium: 4.3 mmol/L (ref 3.5–5.2)
Sodium: 138 mmol/L (ref 134–144)
eGFR: 62 mL/min/{1.73_m2} (ref >59)

## 2021-09-12 LAB — CBC WITH DIFFERENTIAL/PLATELET
Basophils Absolute: 0 10*3/uL (ref 0.0–0.2)
Basos: 1 %
EOS (ABSOLUTE): 0.1 10*3/uL (ref 0.0–0.4)
Eos: 2 %
Hematocrit: 35.9 % (ref 34.0–46.6)
Hemoglobin: 11.7 g/dL (ref 11.1–15.9)
Lymphocytes Absolute: 2 10*3/uL (ref 0.7–3.1)
Lymphs: 31 %
MCH: 28.6 pg (ref 26.6–33.0)
MCHC: 32.6 g/dL (ref 31.5–35.7)
MCV: 88 fL (ref 79–97)
Monocytes Absolute: 0.7 10*3/uL (ref 0.1–0.9)
Monocytes: 11 %
Neutrophils Absolute: 3.6 10*3/uL (ref 1.4–7.0)
Neutrophils: 55 %
Platelets: 284 10*3/uL (ref 150–450)
RBC: 4.09 x10E6/uL (ref 3.77–5.28)
RDW: 13.2 % (ref 11.7–15.4)
WBC: 6.4 10*3/uL (ref 3.4–10.8)

## 2021-09-12 NOTE — Progress Notes (Signed)
Electrophysiology Office Note:    Date:  09/12/2021   ID:  Autumn Young, DOB 01-Dec-1945, MRN [id number]  PCP:  Patient, No Pcp Per  [facility] Cardiologist:  Rollene Rotunda, MD  [facility] Electrophysiologist:  Lanier Prude, MD   Referring MD: No ref. provider found   Chief Complaint: New patient consult for PPM  History of Present Illness:    Autumn Young is a 76 y.o. female who presents for an evaluation for PPM at the request of Dr. Antoine Poche. Their medical history includes stroke, hypertension, GERD, COPD, asthma.  She saw Dr. Antoine Poche on 07/25/2021 for post-hospital follow up of paresthesia and syncope. Her EKG showed  normal sinus rhythm, rate 66, axis within normal limits, intervals within normal limits, no acute ST-T wave changes. It was thought her syncope could be arrhythmia mediated as there is not clearly a neurologic or vascular etiology.   On 08/01/2021 [employer] monitoring noted a 7 second 3rd degree AVB and sinus brady at 5:23 AM that day.  Today, she reports occasional palpitations when her heart rate is low. Also, if she laughs she will subsequently pass out. Sometimes she has a significant cough with associated shortness of breath.  Of note, there have been similar issues of very low heart rates with syncope in other family members.  She denies any chest pain, or peripheral edema. No lightheadedness, headaches, orthopnea, or PND.     Past Medical History:  Diagnosis Date   Asthma    COPD (chronic obstructive pulmonary disease) (HCC)    GERD (gastroesophageal reflux disease)    Hypertension    Stroke [facility])     Past Surgical History:  Procedure Laterality Date   TUBAL LIGATION      Current Medications: Current Meds  Medication Sig   albuterol (PROVENTIL HFA) 108 (90 Base) MCG/ACT inhaler Inhale 2 puffs into the lungs every 6 (six) hours as needed for wheezing or shortness of breath.   ascorbic acid (VITAMIN C) 500 MG tablet Take 1,000 mg  by mouth daily.   BREO ELLIPTA 200-25 MCG/ACT AEPB Inhale 1 puff into the lungs daily.   chlorthalidone (HYGROTON) 25 MG tablet Take 25 mg by mouth daily.   Cholecalciferol 125 MCG (5000 UT) TABS Take 5,000 Units by mouth daily.   clopidogrel (PLAVIX) 75 MG tablet Take 75 mg by mouth daily.   ezetimibe (ZETIA) 10 MG tablet Take 10 mg by mouth daily.   montelukast (SINGULAIR) 10 MG tablet Take 10 mg by mouth at bedtime.   olmesartan (BENICAR) 20 MG tablet Take 20 mg by mouth at bedtime.   Omega-3 1000 MG CAPS Take 1,000 mg by mouth daily.   pantoprazole (PROTONIX) 40 MG tablet Take 40 mg by mouth 2 (two) times daily.    pyridOXINE (B-6) 50 MG tablet Take 1 tablet by mouth daily.   SPIRIVA RESPIMAT 1.25 MCG/ACT AERS Inhale 2 puffs into the lungs daily.     Allergies:   Shellfish allergy, Simvastatin, Lasix [furosemide], Latex, and Penicillins   Social History   Socioeconomic History   Marital status: Divorced    Spouse name: Not on file   Number of children: Not on file   Years of education: Not on file   Highest education level: Not on file  Occupational History   Not on file  Tobacco Use   Smoking status: Never   Smokeless tobacco: Never  Vaping Use   Vaping Use: Never used  Substance and Sexual Activity   Alcohol  use: No   Drug use: No   Sexual activity: Not on file  Other Topics Concern   Not on file  Social History Narrative   Lives with a friend.  3 children with one deceased.     Social Determinants of Health   Financial Resource Strain: Not on file  Food Insecurity: Not on file  Transportation Needs: Not on file  Physical Activity: Not on file  Stress: Not on file  Social Connections: Not on file     Family History: The patient's family history includes Cancer in her brother, mother, and sister; Diabetes in her sister; Heart disease in her sister; Hypertension in her brother, father, and sister.  ROS:   Please see the history of present illness.    (+)  Palpitations (+) Syncope (+) Shortness of breath (+) Tremors All other systems reviewed and are negative.  EKGs/Labs/Other Studies Reviewed:    The following studies were reviewed today:  08/2021  Monitor: Normal sinus Sinus bradycardia Third degree heart block Prolonged episode of heart block with absent ventricular escape Episodes of junctional escape No symptoms Critical events occurred during the sleeping hours.    06/07/2021  Echo:  1. Left ventricular ejection fraction, by estimation, is 60 to 65%. The  left ventricle has normal function. The left ventricle has no regional  wall motion abnormalities. There is moderate concentric left ventricular  hypertrophy. Left ventricular  diastolic parameters are consistent with Grade II diastolic dysfunction  (pseudonormalization).   2. Right ventricular systolic function is normal. The right ventricular  size is normal. There is normal pulmonary artery systolic pressure.   3. Left atrial size was mildly dilated.   4. The mitral valve is normal in structure. No evidence of mitral valve  regurgitation. No evidence of mitral stenosis.   5. The aortic valve is tricuspid. Aortic valve regurgitation is not  visualized. Aortic valve sclerosis is present, with no evidence of aortic  valve stenosis.   6. The inferior vena cava is normal in size with greater than 50%  respiratory variability, suggesting right atrial pressure of 3 mmHg.   Comparison(s): No prior Echocardiogram.    EKG:   EKG is personally reviewed.  09/12/2021:  EKG was not ordered.    Recent Labs: 06/06/2021: ALT 25 06/07/2021: TSH 10.269 06/08/2021: BUN 14; Creatinine, Ser 1.08; Hemoglobin 11.6; Platelets 260; Potassium 3.5; Sodium 136   Recent Lipid Panel    Component Value Date/Time   CHOL 197 06/07/2021 0401   TRIG 153 (H) 06/07/2021 0401   HDL 37 (L) 06/07/2021 0401   CHOLHDL 5.3 06/07/2021 0401   VLDL 31 06/07/2021 0401   LDLCALC 129 (H) 06/07/2021 0401     Physical Exam:    VS:  BP 132/64   Pulse 86   Ht 5\' 6"  (1.676 m)   Wt 292 lb (132.5 kg)   SpO2 96%   BMI 47.13 kg/m     Wt Readings from Last 3 Encounters:  09/12/21 292 lb (132.5 kg)  08/06/21 283 lb 4 oz (128.5 kg)  07/25/21 285 lb 12.8 oz (129.6 kg)     GEN: Well nourished, well developed in no acute distress HEENT: Normal NECK: No JVD; No carotid bruits LYMPHATICS: No lymphadenopathy CARDIAC: RRR, no murmurs, rubs, gallops RESPIRATORY:  Clear to auscultation without rales, wheezing or rhonchi  ABDOMEN: Soft, non-tender, non-distended MUSCULOSKELETAL:  No edema; No deformity  SKIN: Warm and dry NEUROLOGIC:  Alert and oriented x 3 PSYCHIATRIC:  Normal affect  ASSESSMENT:    1. Syncope, unspecified syncope type   2. Essential hypertension   3. Cerebrovascular accident (CVA), unspecified mechanism (HCC)   4. Tremor    PLAN:    In order of problems listed above:  #Syncope #Complete heart block, transient The patient has an indication for permanent pacing given her paroxysmal complete heart block and syncope history.  I discussed permanent pacing in detail with the patient during today's visit include the risks and recovery and she wishes to proceed.  Risks, benefits, alternatives to PPM implantation were discussed in detail with the patient today. The patient understands that the risks include but are not limited to bleeding, infection, pneumothorax, perforation, tamponade, vascular damage, renal failure, MI, stroke, death, and lead dislodgement and wishes to proceed.  We will therefore schedule device implantation at the next available time.   #Tachycardia-bradycardia #Essential tremor The patient has a tremor for which her physicians would like to be out of start a beta-blocker.  This is previously been impossible due to her history of profound bradycardia and heart block.  Permanent pacing will allow this medication to be started to help improve quality  of life from his Pereyra.   Proceed with dual chamber Biotronik PPM implant.  Total time spent with patient today 60 minutes. This includes reviewing records, evaluating the patient and coordinating care.  Medication Adjustments/Labs and Tests Ordered: Current medicines are reviewed at length with the patient today.  Concerns regarding medicines are outlined above.  No orders of the defined types were placed in this encounter.  No orders of the defined types were placed in this encounter.   I,Mathew Stumpf,acting as a Neurosurgeon for Lanier Prude, MD.,have documented all relevant documentation on the behalf of Lanier Prude, MD,as directed by  Lanier Prude, MD while in the presence of Lanier Prude, MD.  I, Lanier Prude, MD, have reviewed all documentation for this visit. The documentation on 09/12/21 for the exam, diagnosis, procedures, and orders are all accurate and complete.   Signed, Rossie Muskrat. Lalla Brothers, MD, [facility], [facility] 09/12/2021 11:17 AM    Electrophysiology [facility]

## 2021-09-12 NOTE — Patient Instructions (Addendum)
Medication Instructions:  Your physician recommends that you continue on your current medications as directed. Please refer to the Current Medication list given to you today. *If you need a refill on your cardiac medications before your next appointment, please call your pharmacy*  Lab Work: CBC, BMP  If you have labs (blood work) drawn today and your tests are completely normal, you will receive your results only by: [place] (if you have MyChart) OR A paper copy in the mail If you have any lab test that is abnormal or we need to change your treatment, we will call you to review the results.  Testing/Procedures: Your physician has recommended that you have a pacemaker inserted. A pacemaker is a small device that is placed under the skin of your chest or abdomen to help control abnormal heart rhythms. This device uses electrical pulses to prompt the heart to beat at a normal rate. Pacemakers are used to treat heart rhythms that are too slow. Wire (leads) are attached to the pacemaker that goes into the chambers of you heart. This is done in the hospital and usually requires and overnight stay. Please see the instruction sheet given to you today for more information.   Follow-Up: At [facility], you and your health needs are our priority.  As part of our continuing mission to provide you with exceptional heart care, we have created designated Provider Care Teams.  These Care Teams include your primary Cardiologist (physician) and Advanced Practice Providers (APPs -  Physician Assistants and Nurse Practitioners) who all work together to provide you with the care you need, when you need it.  Your physician wants you to follow-up in: see instruction letter.  We recommend signing up for the patient portal called "MyChart".  Sign up information is provided on this After Visit Summary.  MyChart is used to connect with patients for Virtual Visits (Telemedicine).  Patients are able to view  lab/test results, encounter notes, upcoming appointments, etc.  Non-urgent messages can be sent to your provider as well.   To learn more about what you can do with MyChart, go to [email or web address].    Any Other Special Instructions Will Be Listed Below (If Applicable).  Pacemaker Implantation, Adult Pacemaker implantation is a procedure to place a pacemaker inside the chest. A pacemaker is a small computer that sends electrical signals to the heart and helps the heart beat normally. A pacemaker also stores information about heart rhythms. You may need pacemaker implantation if you have: A slow heartbeat (bradycardia). Loss of consciousness that happens repeatedly (syncope) or repeated episodes of dizziness or light-headedness because of an irregular heart rate. Shortness of breath (dyspnea) due to heart problems. The pacemaker usually attaches to your heart through a wire called a lead. One or two leads may be needed. There are different types of pacemakers: Transvenous pacemaker. This type is placed under the skin or muscle of your upper chest area. The lead goes through a vein in the chest area to reach the inside of the heart. Epicardial pacemaker. This type is placed under the skin or muscle of your chest or abdomen. The lead goes through your chest to the outside of the heart. Tell a health care provider about: Any allergies you have. All medicines you are taking, including vitamins, herbs, eye drops, creams, and over-the-counter medicines. Any problems you or family members have had with anesthetic medicines. Any blood or bone disorders you have. Any surgeries you have had. Any medical conditions you have. Whether  you are pregnant or may be pregnant. What are the risks? Generally, this is a safe procedure. However, problems may occur, including: Infection. Bleeding. Failure of the pacemaker or the lead. Collapse of a lung or bleeding into a lung. Blood clot inside a blood  vessel with a lead. Damage to the heart. Infection inside the heart (endocarditis). Allergic reactions to medicines. What happens before the procedure? Staying hydrated Follow instructions from your health care provider about hydration, which may include: Up to 2 hours before the procedure - you may continue to drink clear liquids, such as water, clear fruit juice, black coffee, and plain tea.  Eating and drinking restrictions Follow instructions from your health care provider about eating and drinking, which may include: 8 hours before the procedure - stop eating heavy meals or foods, such as meat, fried foods, or fatty foods. 6 hours before the procedure - stop eating light meals or foods, such as toast or cereal. 6 hours before the procedure - stop drinking milk or drinks that contain milk. 2 hours before the procedure - stop drinking clear liquids. Medicines Ask your health care provider about: Changing or stopping your regular medicines. This is especially important if you are taking diabetes medicines or blood thinners. Taking medicines such as aspirin and ibuprofen. These medicines can thin your blood. Do not take these medicines unless your health care provider tells you to take them. Taking over-the-counter medicines, vitamins, herbs, and supplements. Tests You may have: A heart evaluation. This may include: An electrocardiogram (ECG). This involves placing patches on your skin to check your heart rhythm. A chest X-ray. An echocardiogram. This is a test that uses sound waves (ultrasound) to produce an image of the heart. A cardiac rhythm monitor. This is used to record your heart rhythm and any events for a longer period of time. Blood tests. Genetic testing. General instructions Do not use any products that contain nicotine or tobacco for at least 4 weeks before the procedure. These products include cigarettes, e-cigarettes, and chewing tobacco. If you need help quitting, ask  your health care provider. Ask your health care provider: How your surgery site will be marked. What steps will be taken to help prevent infection. These steps may include: Removing hair at the surgery site. Washing skin with a germ-killing soap. Receiving antibiotic medicine. Plan to have someone take you home from the hospital or clinic. If you will be going home right after the procedure, plan to have someone with you for 24 hours. What happens during the procedure? An IV will be inserted into one of your veins. You will be given one or more of the following: A medicine to help you relax (sedative). A medicine to numb the area (local anesthetic). A medicine to make you fall asleep (general anesthetic). The next steps vary depending on the type of pacemaker you will be getting. If you are getting a transvenous pacemaker: An incision will be made in your upper chest. A pocket will be made for the pacemaker. It may be placed under the skin or between layers of muscle. The lead will be inserted into a blood vessel that goes to the heart. While X-rays are taken by an imaging machine (fluoroscopy), the lead will be advanced through the vein to the inside of your heart. The other end of the lead will be tunneled under the skin and attached to the pacemaker. If you are getting an epicardial pacemaker: An incision will be made near your ribs or breastbone (  sternum) for the lead. The lead will be attached to the outside of your heart. Another incision will be made in your chest or upper abdomen to create a pocket for the pacemaker. The free end of the lead will be tunneled under the skin and attached to the pacemaker. The transvenous or epicardial pacemaker will be tested. Imaging studies may be done to check the lead position. The incisions will be closed with stitches (sutures), adhesive strips, or skin glue. Bandages (dressings) will be placed over the incisions. The procedure may vary  among health care providers and hospitals. What happens after the procedure? Your blood pressure, heart rate, breathing rate, and blood oxygen level will be monitored until you leave the hospital or clinic. You may be given antibiotics. You will be given pain medicine. An ECG and chest X-rays will be done. You may need to wear a continuous type of ECG (Holter monitor) to check your heart rhythm. Your health care provider will program the pacemaker. If you were given a sedative during the procedure, it can affect you for several hours. Do not drive or operate machinery until your health care provider says that it is safe. You will be given a pacemaker identification card. This card lists the implant date, device model, and manufacturer of your pacemaker. Summary A pacemaker is a small computer that sends electrical signals to the heart and helps the heart beat normally. There are different types of pacemakers. A pacemaker may be placed under the skin or muscle of your chest or abdomen. Follow instructions from your health care provider about eating and drinking and about taking medicines before the procedure. This information is not intended to replace advice given to you by your health care provider. Make sure you discuss any questions you have with your health care provider. Document Revised: 11/27/2020 Document Reviewed: 02/16/2019 Elsevier Patient Education  2023 [employer].

## 2021-09-23 ENCOUNTER — Encounter (HOSPITAL_COMMUNITY)
Admission: RE | Disposition: A | Discharge status: Home / Self Care | Payer: Self-pay | Source: Home / Self Care | Attending: Cardiology

## 2021-09-23 ENCOUNTER — Other Ambulatory Visit: Payer: Self-pay

## 2021-09-23 ENCOUNTER — Ambulatory Visit (HOSPITAL_COMMUNITY)
Admission: RE | Admit: 2021-09-23 | Discharge: 2021-09-24 | Disposition: A | Discharge status: Home / Self Care | Payer: Medicare HMO | Attending: Cardiology | Admitting: Cardiology

## 2021-09-23 DIAGNOSIS — I10 Essential (primary) hypertension: Secondary | ICD-10-CM | POA: Diagnosis not present

## 2021-09-23 DIAGNOSIS — K219 Gastro-esophageal reflux disease without esophagitis: Secondary | ICD-10-CM | POA: Diagnosis not present

## 2021-09-23 DIAGNOSIS — G250 Essential tremor: Secondary | ICD-10-CM | POA: Insufficient documentation

## 2021-09-23 DIAGNOSIS — J449 Chronic obstructive pulmonary disease, unspecified: Secondary | ICD-10-CM | POA: Insufficient documentation

## 2021-09-23 DIAGNOSIS — Z8673 Personal history of transient ischemic attack (TIA), and cerebral infarction without residual deficits: Secondary | ICD-10-CM | POA: Diagnosis not present

## 2021-09-23 DIAGNOSIS — I495 Sick sinus syndrome: Secondary | ICD-10-CM | POA: Diagnosis not present

## 2021-09-23 DIAGNOSIS — I442 Atrioventricular block, complete: Principal | ICD-10-CM | POA: Insufficient documentation

## 2021-09-23 DIAGNOSIS — Z950 Presence of cardiac pacemaker: Principal | ICD-10-CM | POA: Diagnosis present

## 2021-09-23 HISTORY — PX: PACEMAKER IMPLANT: EP1218

## 2021-09-23 SURGERY — PACEMAKER IMPLANT

## 2021-09-23 MED ORDER — FLUTICASONE FUROATE-VILANTEROL 200-25 MCG/ACT IN AEPB
1.0000 | INHALATION_SPRAY | Freq: Every day | RESPIRATORY_TRACT | Status: DC
  Filled 2021-09-23 (×2): qty 28

## 2021-09-23 MED ORDER — LIDOCAINE HCL 1 % IJ SOLN
INTRAMUSCULAR | Status: AC
  Filled 2021-09-23: qty 20

## 2021-09-23 MED ORDER — ALBUTEROL SULFATE HFA 108 (90 BASE) MCG/ACT IN AERS
2.0000 | INHALATION_SPRAY | Freq: Four times a day (QID) | RESPIRATORY_TRACT | Status: DC | PRN
Start: 2021-09-23 — End: 2021-09-23

## 2021-09-23 MED ORDER — EZETIMIBE 10 MG PO TABS
10.0000 mg | ORAL_TABLET | Freq: Every day | ORAL | Status: DC
  Administered 2021-09-23 – 2021-09-24 (×2): 10 mg via ORAL
  Filled 2021-09-23 (×2): qty 1

## 2021-09-23 MED ORDER — VANCOMYCIN HCL 1500 MG/300ML IV SOLN
1500.0000 mg | INTRAVENOUS | Status: DC
  Administered 2021-09-23: 1500 mg via INTRAVENOUS
  Filled 2021-09-23: qty 300

## 2021-09-23 MED ORDER — SODIUM CHLORIDE 0.9 % IV SOLN
80.0000 mg | INTRAVENOUS | Status: AC
  Administered 2021-09-23: 80 mg

## 2021-09-23 MED ORDER — LIDOCAINE HCL (PF) 1 % IJ SOLN
INTRAMUSCULAR | Status: DC | PRN
  Administered 2021-09-23: 60 mL
  Administered 2021-09-23: 20 mL

## 2021-09-23 MED ORDER — POVIDONE-IODINE 10 % EX SWAB
2.0000 | Freq: Once | CUTANEOUS | Status: AC
  Administered 2021-09-23: 2 via TOPICAL

## 2021-09-23 MED ORDER — SODIUM CHLORIDE 0.9 % IV SOLN: INTRAVENOUS | Status: DC

## 2021-09-23 MED ORDER — TIOTROPIUM BROMIDE MONOHYDRATE 1.25 MCG/ACT IN AERS
2.0000 | INHALATION_SPRAY | Freq: Every morning | RESPIRATORY_TRACT | Status: DC
Start: 2021-09-24 — End: 2021-09-23

## 2021-09-23 MED ORDER — SODIUM CHLORIDE 0.9 % IV SOLN
INTRAVENOUS | Status: AC
  Filled 2021-09-23: qty 2

## 2021-09-23 MED ORDER — ONDANSETRON HCL 4 MG/2ML IJ SOLN: 4.0000 mg | Freq: Four times a day (QID) | INTRAMUSCULAR | Status: DC | PRN

## 2021-09-23 MED ORDER — PANTOPRAZOLE SODIUM 40 MG PO TBEC
40.0000 mg | DELAYED_RELEASE_TABLET | Freq: Every day | ORAL | Status: DC
  Administered 2021-09-24: 40 mg via ORAL
  Filled 2021-09-23: qty 1

## 2021-09-23 MED ORDER — FENTANYL CITRATE (PF) 100 MCG/2ML IJ SOLN
INTRAMUSCULAR | Status: DC | PRN
  Administered 2021-09-23 (×3): 25 ug via INTRAVENOUS

## 2021-09-23 MED ORDER — UMECLIDINIUM BROMIDE 62.5 MCG/ACT IN AEPB
1.0000 | INHALATION_SPRAY | Freq: Every day | RESPIRATORY_TRACT | Status: DC
  Filled 2021-09-23 (×2): qty 7

## 2021-09-23 MED ORDER — MONTELUKAST SODIUM 10 MG PO TABS
10.0000 mg | ORAL_TABLET | Freq: Every day | ORAL | Status: DC
  Administered 2021-09-23: 10 mg via ORAL
  Filled 2021-09-23: qty 1

## 2021-09-23 MED ORDER — IRBESARTAN 150 MG PO TABS
150.0000 mg | ORAL_TABLET | Freq: Every day | ORAL | Status: DC
  Administered 2021-09-23: 150 mg via ORAL
  Filled 2021-09-23: qty 1

## 2021-09-23 MED ORDER — FLUTICASONE FUROATE-VILANTEROL 200-25 MCG/ACT IN AEPB: 1.0000 | INHALATION_SPRAY | Freq: Every morning | RESPIRATORY_TRACT | Status: DC

## 2021-09-23 MED ORDER — ALBUTEROL SULFATE (2.5 MG/3ML) 0.083% IN NEBU: 2.5000 mg | INHALATION_SOLUTION | Freq: Four times a day (QID) | RESPIRATORY_TRACT | Status: DC | PRN

## 2021-09-23 MED ORDER — ACETAMINOPHEN 325 MG PO TABS
325.0000 mg | ORAL_TABLET | ORAL | Status: DC | PRN
  Administered 2021-09-23 – 2021-09-24 (×3): 650 mg via ORAL
  Filled 2021-09-23 (×3): qty 2

## 2021-09-23 MED ORDER — HEPARIN (PORCINE) IN NACL 1000-0.9 UT/500ML-% IV SOLN
INTRAVENOUS | Status: DC | PRN
  Administered 2021-09-23: 500 mL

## 2021-09-23 MED ORDER — MIDAZOLAM HCL 5 MG/5ML IJ SOLN
INTRAMUSCULAR | Status: DC | PRN
  Administered 2021-09-23 (×3): 1 mg via INTRAVENOUS

## 2021-09-23 MED ORDER — CHLORTHALIDONE 25 MG PO TABS
25.0000 mg | ORAL_TABLET | Freq: Every morning | ORAL | Status: DC
  Administered 2021-09-24: 25 mg via ORAL
  Filled 2021-09-23 (×2): qty 1

## 2021-09-23 MED ORDER — FENTANYL CITRATE (PF) 100 MCG/2ML IJ SOLN
INTRAMUSCULAR | Status: AC
  Filled 2021-09-23: qty 2

## 2021-09-23 MED ORDER — CHLORHEXIDINE GLUCONATE 4 % EX LIQD: 4.0000 | Freq: Once | CUTANEOUS | Status: DC

## 2021-09-23 MED ORDER — LIDOCAINE HCL 1 % IJ SOLN
INTRAMUSCULAR | Status: AC
  Filled 2021-09-23: qty 60

## 2021-09-23 MED ORDER — MIDAZOLAM HCL 5 MG/5ML IJ SOLN
INTRAMUSCULAR | Status: AC
  Filled 2021-09-23: qty 5

## 2021-09-23 SURGICAL SUPPLY — 12 items
CABLE SURGICAL S-101-97-12 (CABLE) ×2 IMPLANT
KIT ACCESSORY SELECTRA FIX CVD (MISCELLANEOUS) ×1 IMPLANT
LEAD SELECTRA 3D-55-42 (CATHETERS) ×1 IMPLANT
LEAD SOLIA S PRO MRI 53 (Lead) ×1 IMPLANT
LEAD SOLIA S PRO MRI 60 (Lead) ×1 IMPLANT
MAT PREVALON FULL STRYKER (MISCELLANEOUS) ×1 IMPLANT
PACEMAKER EDORA 8DR-T MRI (Pacemaker) ×1 IMPLANT
PAD DEFIB RADIO PHYSIO CONN (PAD) ×2 IMPLANT
SHEATH 7FR PRELUDE SNAP 13 (SHEATH) ×1 IMPLANT
SHEATH 9FR PRELUDE SNAP 13 (SHEATH) ×1 IMPLANT
SHEATH PROBE COVER 6X72 (BAG) ×1 IMPLANT
TRAY PACEMAKER INSERTION (PACKS) ×2 IMPLANT

## 2021-09-24 ENCOUNTER — Encounter (HOSPITAL_COMMUNITY): Payer: Self-pay | Admitting: Cardiology

## 2021-09-24 ENCOUNTER — Telehealth: Payer: Self-pay

## 2021-09-24 ENCOUNTER — Ambulatory Visit (HOSPITAL_COMMUNITY): Payer: Medicare HMO

## 2021-09-24 DIAGNOSIS — I442 Atrioventricular block, complete: Secondary | ICD-10-CM | POA: Diagnosis not present

## 2021-09-24 MED ORDER — PROPRANOLOL HCL 20 MG PO TABS
20.0000 mg | ORAL_TABLET | Freq: Two times a day (BID) | ORAL | 6 refills | Status: DC
Start: 2021-09-24 — End: 2022-02-24

## 2021-09-24 MED ORDER — CLOPIDOGREL BISULFATE 75 MG PO TABS: 75.0000 mg | ORAL_TABLET | Freq: Every morning | ORAL | Status: DC

## 2021-10-06 NOTE — Progress Notes (Unsigned)
Cardiology Office Note Date:  10/06/2021  Patient ID:  Autumn Young, Autumn Young [birth year], MRN [id number] PCP:  Patient, No Pcp Per  Cardiologist:  Dr. Antoine Poche Electrophysiologist: Dr. Lalla Brothers  ***refresh   Chief Complaint: *** wound check  History of Present Illness: Autumn Young is a 76 y.o. female with history of unclear/questionable hx of stroke,maintained on Plavix, , HFpEF, HTN, HLD, obesity, GERD, COPD, tremor  Initially referred to cardiology for syncope and near syncope, she saw dr. Antoine Poche reviewed her record, noted she did have rhythm strips with one probably blocked PAC but no high risk arrhythmias Discussed shortness of breath thought to be related to hypoventilation obesity syndrome and sleep apnea.   Planned for monitoring, discussed apparently discussion elsewhere about BB or primodone for tremor, thought perhaps she could tolerate BB but would wait on monitor findings.  Monitor noted transient episode of CHB and bradycardia and referred to Dr. Lalla Brothers. 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   Past Medical History:  Diagnosis Date   Asthma    COPD (chronic obstructive pulmonary disease) (HCC)    GERD (gastroesophageal reflux disease)    Hypertension    Stroke [facility])     Past Surgical History:  Procedure Laterality Date   PACEMAKER IMPLANT N/A 09/23/2021   Procedure: PACEMAKER IMPLANT;  Surgeon: Lanier Prude, MD;  Location: [facility];  Service: Cardiovascular;  Laterality: N/A;   TUBAL LIGATION      Current Outpatient Medications  Medication Sig Dispense Refill   acetaminophen (TYLENOL) 500 MG tablet Take 500-1,000 mg by mouth every 6 (six) hours as needed (pain).     albuterol (PROVENTIL HFA) 108 (90 Base) MCG/ACT inhaler Inhale 2 puffs into the lungs every 6 (six) hours as needed for  wheezing or shortness of breath. 18 g 0   ascorbic acid (VITAMIN C) 1000 MG tablet Take 1,000 mg by mouth daily.     BREO ELLIPTA 200-25 MCG/ACT AEPB Inhale 1 puff into the lungs in the morning.     chlorthalidone (HYGROTON) 25 MG tablet Take 25 mg by mouth in the morning.     Cholecalciferol 25 MCG (1000 UT) tablet Take 1,000 Units by mouth in the morning.     clopidogrel (PLAVIX) 75 MG tablet Take 1 tablet (75 mg total) by mouth in the morning.     Cyanocobalamin (VITAMIN B-12 PO) Take 4 drops by mouth in the morning.     ezetimibe (ZETIA) 10 MG tablet Take 10 mg by mouth daily.     montelukast (SINGULAIR) 10 MG tablet Take 10 mg by mouth at bedtime.     olmesartan (BENICAR) 20 MG tablet Take 20 mg by mouth at bedtime.     Omega-3 Fatty Acids (OMEGA-3 PO) Take 1 capsule by mouth in the morning.     pantoprazole (PROTONIX) 40 MG tablet Take 40 mg by mouth daily before breakfast.     propranolol (INDERAL) 20 MG tablet Take 1 tablet (20 mg total) by mouth 2 (two) times daily. 60 tablet 6   PYRIDOXINE HCL PO Take 200 mg by mouth in the morning.     Red Yeast Rice Extract (RED YEAST RICE PO) Take 1 tablet by mouth daily.     SPIRIVA RESPIMAT 1.25 MCG/ACT AERS Inhale 2 puffs into the lungs in the morning.  No current facility-administered medications for this visit.    Allergies:   Shellfish allergy, Zocor [simvastatin], Lasix [furosemide], Latex, and Penicillins   Social History:  The patient  reports that she has never smoked. She has never used smokeless tobacco. She reports that she does not drink alcohol and does not use drugs.   Family History:  The patient's family history includes Cancer in her brother, mother, and sister; Diabetes in her sister; Heart disease in her sister; Hypertension in her brother, father, and sister.  ROS:  Please see the history of present illness.    All other systems are reviewed and otherwise negative.   PHYSICAL EXAM:  VS:  There were no vitals taken  for this visit. BMI: There is no height or weight on file to calculate BMI. Well nourished, well developed, in no acute distress HEENT: normocephalic, atraumatic Neck: no JVD, carotid bruits or masses Cardiac:  *** RRR; no significant murmurs, no rubs, or gallops Lungs:  *** CTA b/l, no wheezing, rhonchi or rales Abd: soft, nontender MS: no deformity or *** atrophy Ext: *** no edema Skin: warm and dry, no rash Neuro:  No gross deficits appreciated Psych: euthymic mood, full affect  *** PPM site is stable, no tethering or discomfort   EKG:  Done today and reviewed by myself shows  ***  Device interrogation done today and reviewed by myself:  ***   06/07/2021: TTE  1. Left ventricular ejection fraction, by estimation, is 60 to 65%. The  left ventricle has normal function. The left ventricle has no regional  wall motion abnormalities. There is moderate concentric left ventricular  hypertrophy. Left ventricular  diastolic parameters are consistent with Grade II diastolic dysfunction  (pseudonormalization).   2. Right ventricular systolic function is normal. The right ventricular  size is normal. There is normal pulmonary artery systolic pressure.   3. Left atrial size was mildly dilated.   4. The mitral valve is normal in structure. No evidence of mitral valve  regurgitation. No evidence of mitral stenosis.   5. The aortic valve is tricuspid. Aortic valve regurgitation is not  visualized. Aortic valve sclerosis is present, with no evidence of aortic  valve stenosis.   6. The inferior vena cava is normal in size with greater than 50%  respiratory variability, suggesting right atrial pressure of 3 mmHg.   Comparison(s): No prior Echocardiogram.   Recent Labs: 06/06/2021: ALT 25 06/07/2021: TSH 10.269 09/12/2021: BUN 15; Creatinine, Ser 0.95; Hemoglobin 11.7; Platelets 284; Potassium 4.3; Sodium 138  06/07/2021: Cholesterol 197; HDL 37; LDL Cholesterol 129; Total CHOL/HDL Ratio 5.3;  Triglycerides 153; VLDL 31   CrCl cannot be calculated (Patient's most recent lab result is older than the maximum 21 days allowed.).   Wt Readings from Last 3 Encounters:  09/23/21 292 lb 1.8 oz (132.5 kg)  09/12/21 292 lb (132.5 kg)  08/06/21 283 lb 4 oz (128.5 kg)     Other studies reviewed: Additional studies/records reviewed today include: summarized above  ASSESSMENT AND PLAN:  PPM ***  *** she could be started on BB for her tremor at this juncture  HTN ***  3.   HFpEF *** C/w Dr. Ernestina Columbia   Disposition: F/u with ***  Current medicines are reviewed at length with the patient today.  The patient did not have any concerns regarding medicines.  Norma Fredrickson, PA-C 10/06/2021 11:02 AM     [facility] [address] [place] [address] [phone] (office)  [phone] (fax)

## 2021-10-07 ENCOUNTER — Encounter: Payer: Self-pay | Admitting: Physician Assistant

## 2021-10-07 ENCOUNTER — Ambulatory Visit: Payer: Medicare HMO | Admitting: Physician Assistant

## 2021-10-07 VITALS — BP 136/80 | HR 80 | Ht 66.0 in | Wt 293.8 lb

## 2021-10-07 DIAGNOSIS — I10 Essential (primary) hypertension: Secondary | ICD-10-CM | POA: Diagnosis not present

## 2021-10-07 DIAGNOSIS — I5032 Chronic diastolic (congestive) heart failure: Secondary | ICD-10-CM | POA: Diagnosis not present

## 2021-10-07 DIAGNOSIS — Z950 Presence of cardiac pacemaker: Secondary | ICD-10-CM | POA: Diagnosis not present

## 2021-10-07 LAB — CUP PACEART INCLINIC DEVICE CHECK
Date Time Interrogation Session: 20230710171959
Implantable Lead Implant Date: 20230626
Implantable Lead Implant Date: 20230626
Implantable Lead Location: 753859
Implantable Lead Location: 753860
Implantable Lead Model: 377
Implantable Lead Model: 377
Implantable Lead Serial Number: 8000898701
Implantable Lead Serial Number: 8000923303
Implantable Pulse Generator Implant Date: 20230626
Lead Channel Pacing Threshold Amplitude: 0.8 V
Lead Channel Pacing Threshold Amplitude: 1.3 V
Lead Channel Pacing Threshold Pulse Width: 0.4 ms
Lead Channel Pacing Threshold Pulse Width: 0.4 ms
Lead Channel Sensing Intrinsic Amplitude: 1.7 mV
Lead Channel Sensing Intrinsic Amplitude: 5.1 mV
Pulse Gen Model: 407145
Pulse Gen Serial Number: 70433047

## 2021-10-07 NOTE — Patient Instructions (Signed)
Medication Instructions:  Your physician recommends that you continue on your current medications as directed. Please refer to the Current Medication list given to you today.  *If you need a refill on your cardiac medications before your next appointment, please call your pharmacy*   Lab Work: None If you have labs (blood work) drawn today and your tests are completely normal, you will receive your results only by: MyChart Message (if you have MyChart) OR A paper copy in the mail If you have any lab test that is abnormal or we need to change your treatment, we will call you to review the results.   Follow-Up: At [facility], you and your health needs are our priority.  As part of our continuing mission to provide you with exceptional heart care, we have created designated Provider Care Teams.  These Care Teams include your primary Cardiologist (physician) and Advanced Practice Providers (APPs -  Physician Assistants and Nurse Practitioners) who all work together to provide you with the care you need, when you need it.  We recommend signing up for the patient portal called "MyChart".  Sign up information is provided on this After Visit Summary.  MyChart is used to connect with patients for Virtual Visits (Telemedicine).  Patients are able to view lab/test results, encounter notes, upcoming appointments, etc.  Non-urgent messages can be sent to your provider as well.   To learn more about what you can do with MyChart, go to [email or web address].    Your next appointment:   As scheduled 

## 2021-11-26 ENCOUNTER — Encounter: Payer: Self-pay | Admitting: Neurology

## 2021-11-26 ENCOUNTER — Ambulatory Visit: Payer: Medicare HMO | Admitting: Neurology

## 2021-11-26 VITALS — BP 186/79 | HR 68 | Resp 18 | Ht 66.0 in | Wt 287.0 lb

## 2021-11-26 DIAGNOSIS — G250 Essential tremor: Secondary | ICD-10-CM

## 2021-11-26 NOTE — Progress Notes (Unsigned)
NEUROLOGY FOLLOW UP OFFICE NOTE  Autumn Young [id number] 09/22/1945  HISTORY OF PRESENT ILLNESS: I had the pleasure of seeing Autumn Young in follow-up in the neurology clinic on 11/26/2021.  The patient was last seen 4 months ago for syncope and tremor. She is accompanied by her 78 year old granddaughter [place] today.  Records and images were personally reviewed where available. Since her last visit, she had seen Dr. 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 She denies any further syncopal episodes since April 2023. She was started on Propranolol 20mg  BID by her cardiologist last June and feels that her tremor is better. She has noticed an improvement in her handwriting. She was initially tired but now denies any side effects. She reports some stress with her husband's recent passing 2 weeks ago. She denies any headaches, dizziness, vision changes, focal numbness/tingling/weakness, no falls.    History on Initial Assessment 07/24/2021: This is a pleasant 76 year old right-handed woman with a history of hypertension, CAD, CKD, hyperlipidemia, COPD, obesity, presenting for evaluation of syncope and tremor. The first syncopal episode occurred on 06/06/21, she was at home doing a number game with her granddaughter when all of a sudden she had had "hard shaking" different from her typical tremor, she could see her body shaking real fast and could her hear her granddaughter saying she was scared and she could not see her grandmother's eyes. She called to her housemate to call 911, she continued to see her body shaking and then vomited. She felt like she was out of breath, "like I did not have air," and thinks she lost consciousness, feeling herself go away and when she came back she could still see her body shaking. No diaphoresis, tongue bite, or incontinence. Her granddaughter got her the phone and the patient dialed 911 and  spoke to EMS. She had a headache and when she lay her head back, head was swimming. When they arrived, she was still vomiting but the hard shaking was gone. She had a spinning sensation when she lay on her back. She was "bobbing around" when they tried to stand her up, she was diffusely weak. No tongue bite or incontinence. She had diarrhea in the ER and while in the hospital, she reported feeling ill the past month with intermittent nausea and vomiting, however today states that she had been feeling fine that day and had eaten breakfast earlier in the morning. She had a brain MRI without contrast which I personally reviewed, no acute changes. CTA head and neck no significant stenosis. Her routine EEG was normal. Echocardiogram showed an EF of 60-65% with grade II diastolic dysfunction. She had bradycardia with second degree AV block on telemetry. During her admission, she reported the head tremor had been ongoing for 3 years and had been worsening, so she was referred to Neurology.   She reports having another milder episode 2 weeks ago while her other granddaughter was sitting on her lap. She recalls that she started coughing then passed out for a minute, when she came to, her granddaughter was slapping her face saying she could not see her eyes. She did not have the hard shaking, vomiting, difficulty breathing, dizziness, or fatigue she had that time so she did not seek medical attention. She lives with a housemate and her granddaughter. She has not been told of any staring/unresponsive episodes, she denies any other gaps in time,  no olfactory/gustatory hallucinations, focal numbness/tingling/weakness, myoclonic jerks. She has had intermittent headaches. Since the incident in March, every time she lays down at night, she has vertigo, she turns over or stands up and it resolves, no nausea/vomiting. No diplopia, dysarthria/dysphagia, neck/back pain, constipation, anosmia. She has right shoulder pain. She has  noticed irregular palpitations for a time now, she has the heart monitor equipment but has not worn it yet. Head tremors started around 3 years ago. Her right hand is also affected, affecting writing and using utensils. She states her left hand and legs are not affected. Her maternal grandfather, maternal aunt, and son "shook all the time." Her paternal grandfather had shaking, her older sister is beginning to shake a little bit. Her son had seizures in childhood and passed away from a seizure. She denies any significant head injuries, CNS infections.    PAST MEDICAL HISTORY: Past Medical History:  Diagnosis Date   Asthma    COPD (chronic obstructive pulmonary disease) (HCC)    GERD (gastroesophageal reflux disease)    Hypertension    Stroke [facility])     MEDICATIONS: Current Outpatient Medications on File Prior to Visit  Medication Sig Dispense Refill   acetaminophen (TYLENOL) 500 MG tablet Take 500-1,000 mg by mouth every 6 (six) hours as needed (pain).     albuterol (PROVENTIL HFA) 108 (90 Base) MCG/ACT inhaler Inhale 2 puffs into the lungs every 6 (six) hours as needed for wheezing or shortness of breath. 18 g 0   ascorbic acid (VITAMIN C) 1000 MG tablet Take 1,000 mg by mouth daily.     BREO ELLIPTA 200-25 MCG/ACT AEPB Inhale 1 puff into the lungs in the morning.     Cholecalciferol 25 MCG (1000 UT) tablet Take 1,000 Units by mouth in the morning.     clopidogrel (PLAVIX) 75 MG tablet Take 1 tablet (75 mg total) by mouth in the morning.     Cyanocobalamin (VITAMIN B-12 PO) Take 4 drops by mouth in the morning.     montelukast (SINGULAIR) 10 MG tablet Take 10 mg by mouth at bedtime.     olmesartan (BENICAR) 20 MG tablet Take 20 mg by mouth at bedtime.     Omega-3 Fatty Acids (OMEGA-3 PO) Take 1 capsule by mouth in the morning.     pantoprazole (PROTONIX) 40 MG tablet Take 40 mg by mouth daily before breakfast.     propranolol (INDERAL) 20 MG tablet Take 1 tablet (20 mg total) by mouth 2  (two) times daily. 60 tablet 6   PYRIDOXINE HCL PO Take 200 mg by mouth in the morning.     Red Yeast Rice Extract (RED YEAST RICE PO) Take 1 tablet by mouth daily.     SPIRIVA RESPIMAT 1.25 MCG/ACT AERS Inhale 2 puffs into the lungs in the morning.     No current facility-administered medications on file prior to visit.    ALLERGIES: Allergies  Allergen Reactions   Shellfish Allergy Anaphylaxis   Zocor [Simvastatin] Swelling   Lasix [Furosemide] Swelling   Latex     rash   Penicillins Rash    Has patient had a PCN reaction causing immediate rash, facial/tongue/throat swelling, SOB or lightheadedness with hypotension: Unknown Has patient had a PCN reaction causing severe rash involving mucus membranes or skin necrosis: No Has patient had a PCN reaction that required hospitalization: No Has patient had a PCN reaction occurring within the last 10 years: No If all of the above answers are "NO", then may  proceed with Cephalosporin use.     FAMILY HISTORY: Family History  Problem Relation Age of Onset   Cancer Mother        Lung, cervix, mouth   Hypertension Father    Cancer Sister        Colon   Diabetes Sister    Hypertension Sister    Heart disease Sister    Cancer Brother    Hypertension Brother     SOCIAL HISTORY: Social History   Socioeconomic History   Marital status: Divorced    Spouse name: Not on file   Number of children: Not on file   Years of education: Not on file   Highest education level: Not on file  Occupational History   Not on file  Tobacco Use   Smoking status: Never   Smokeless tobacco: Never  Vaping Use   Vaping Use: Never used  Substance and Sexual Activity   Alcohol use: No   Drug use: No   Sexual activity: Not on file  Other Topics Concern   Not on file  Social History Narrative   Lives with a friend.  3 children with one deceased.     Social Determinants of Health   Financial Resource Strain: Not on file  Food Insecurity: Not  on file  Transportation Needs: Not on file  Physical Activity: Not on file  Stress: Not on file  Social Connections: Not on file  Intimate Partner Violence: Not on file     PHYSICAL EXAM: Vitals:   11/26/21 1114  BP: (!) 186/79  Pulse: 68  Resp: 18  SpO2: 95%   General: No acute distress Head:  Normocephalic/atraumatic Skin/Extremities: No rash, no edema Neurological Exam: alert and awake. No aphasia or dysarthria. Fund of knowledge is appropriate. Attention and concentration are normal.   Cranial nerves: Pupils equal, round. Extraocular movements intact with no nystagmus. Visual fields full.  No facial asymmetry.  Motor: Bulk and tone normal, no cogwheeling, muscle strength 5/5 throughout with no pronator drift.   Finger to nose testing intact.  Gait slightly wide-based, no ataxia. Tremor: She again has a horizontal ("no-no") head tremor that at times stops when she is focusing. No resting tremor. There is no postural tremor today. Minimal action tremor L>R.    IMPRESSION: This is a pleasant 76 yo RH woman with a history of hypertension, CAD, CKD, hyperlipidemia, COPD, obesity, who presented for syncope and essential tremor. 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 She was started on Propranolol 20mg  BID for essential tremor and has noticed improvement, continue on current dose for now. Follow-up in 4 months, call for any changes.    Thank you for allowing me to participate in her care.  Please do not hesitate to call for any questions or concerns.   , M.D.

## 2021-11-26 NOTE — Patient Instructions (Signed)
Good to see you doing better. Continue Propranolol 20mg  twice a day. Follow-up in 4 months, call for any changes.

## 2021-12-24 ENCOUNTER — Ambulatory Visit (INDEPENDENT_AMBULATORY_CARE_PROVIDER_SITE_OTHER): Payer: Medicare HMO

## 2021-12-24 DIAGNOSIS — I442 Atrioventricular block, complete: Secondary | ICD-10-CM

## 2021-12-24 DIAGNOSIS — I639 Cerebral infarction, unspecified: Secondary | ICD-10-CM | POA: Diagnosis not present

## 2021-12-24 DIAGNOSIS — R55 Syncope and collapse: Secondary | ICD-10-CM

## 2021-12-24 LAB — CUP PACEART REMOTE DEVICE CHECK
Date Time Interrogation Session: 20230926084738
Implantable Lead Implant Date: 20230626
Implantable Lead Implant Date: 20230626
Implantable Lead Location: 753859
Implantable Lead Location: 753860
Implantable Lead Model: 377
Implantable Lead Model: 377
Implantable Lead Serial Number: 8000898701
Implantable Lead Serial Number: 8000923303
Implantable Pulse Generator Implant Date: 20230626
Pulse Gen Model: 407145
Pulse Gen Serial Number: 70433047

## 2022-01-03 ENCOUNTER — Encounter: Payer: Medicare HMO | Admitting: Cardiology

## 2022-01-06 NOTE — Progress Notes (Signed)
Remote pacemaker transmission.   

## 2022-02-24 ENCOUNTER — Encounter: Payer: Self-pay | Admitting: Cardiology

## 2022-02-24 ENCOUNTER — Ambulatory Visit: Payer: Medicare HMO | Attending: Cardiology | Admitting: Cardiology

## 2022-02-24 VITALS — BP 162/94 | HR 80 | Ht 66.0 in | Wt 296.0 lb

## 2022-02-24 DIAGNOSIS — Z950 Presence of cardiac pacemaker: Secondary | ICD-10-CM

## 2022-02-24 DIAGNOSIS — I10 Essential (primary) hypertension: Secondary | ICD-10-CM | POA: Diagnosis not present

## 2022-02-24 DIAGNOSIS — R55 Syncope and collapse: Secondary | ICD-10-CM

## 2022-02-24 DIAGNOSIS — Z8673 Personal history of transient ischemic attack (TIA), and cerebral infarction without residual deficits: Secondary | ICD-10-CM | POA: Diagnosis not present

## 2022-02-24 LAB — CUP PACEART INCLINIC DEVICE CHECK
Battery Remaining Longevity: 72 mo
Brady Statistic RA Percent Paced: 95 %
Brady Statistic RV Percent Paced: 1 %
Date Time Interrogation Session: 20231127094709
Implantable Lead Connection Status: 753985
Implantable Lead Connection Status: 753985
Implantable Lead Implant Date: 20230626
Implantable Lead Implant Date: 20230626
Implantable Lead Location: 753859
Implantable Lead Location: 753860
Implantable Lead Model: 377
Implantable Lead Model: 377
Implantable Lead Serial Number: 8000898701
Implantable Lead Serial Number: 8000923303
Implantable Pulse Generator Implant Date: 20230626
Lead Channel Impedance Value: 507 Ohm
Lead Channel Impedance Value: 624 Ohm
Lead Channel Pacing Threshold Amplitude: 0.7 V
Lead Channel Pacing Threshold Amplitude: 0.8 V
Lead Channel Pacing Threshold Pulse Width: 0.4 ms
Lead Channel Pacing Threshold Pulse Width: 0.4 ms
Lead Channel Sensing Intrinsic Amplitude: 1.8 mV
Lead Channel Sensing Intrinsic Amplitude: 5 mV
Pulse Gen Model: 407145
Pulse Gen Serial Number: 70433047

## 2022-02-24 LAB — PACEMAKER DEVICE OBSERVATION

## 2022-02-24 MED ORDER — PROPRANOLOL HCL 20 MG PO TABS: 20.0000 mg | ORAL_TABLET | Freq: Two times a day (BID) | ORAL | 5 refills | Status: DC

## 2022-02-24 MED ORDER — CLOPIDOGREL BISULFATE 75 MG PO TABS: 75.0000 mg | ORAL_TABLET | Freq: Every morning | ORAL | 3 refills | Status: DC

## 2022-02-24 NOTE — Patient Instructions (Signed)
Medication Instructions:  Your physician recommends that you continue on your current medications as directed. Please refer to the Current Medication list given to you today.  *If you need a refill on your cardiac medications before your next appointment, please call your pharmacy*   Lab Work: None ordered.  If you have labs (blood work) drawn today and your tests are completely normal, you will receive your results only by: MyChart Message (if you have MyChart) OR A paper copy in the mail If you have any lab test that is abnormal or we need to change your treatment, we will call you to review the results.   Testing/Procedures: None ordered.    Follow-Up: At [facility], you and your health needs are our priority.  As part of our continuing mission to provide you with exceptional heart care, we have created designated Provider Care Teams.  These Care Teams include your primary Cardiologist (physician) and Advanced Practice Providers (APPs -  Physician Assistants and Nurse Practitioners) who all work together to provide you with the care you need, when you need it.  We recommend signing up for the patient portal called "MyChart".  Sign up information is provided on this After Visit Summary.  MyChart is used to connect with patients for Virtual Visits (Telemedicine).  Patients are able to view lab/test results, encounter notes, upcoming appointments, etc.  Non-urgent messages can be sent to your provider as well.   To learn more about what you can do with MyChart, go to [email or web address].    Your next appointment:   12 months with Dr Lalla Brothers  Important Information About Sugar

## 2022-02-24 NOTE — Progress Notes (Signed)
Electrophysiology Office Follow up Visit Note:    Date:  02/24/2022   ID:  Autumn Young, DOB Oct 24, 1945, MRN [id number]  PCP:  Patient, No Pcp Per  [facility] Cardiologist:  Rollene Rotunda, MD  [facility] Electrophysiologist:  Lanier Prude, MD    Interval History:    Autumn Young is a 76 y.o. female who presents for a follow up visit of her PPM implant. She was last seen in clinic 09/12/2021. Her medical history includes stroke, hypertension, GERD, COPD, asthma.   Since her last appointment, she was seen by Francis Dowse, PA-C 10/07/21 and was doing well.  Today, she is doing well after pacemaker implant.  Incision is healing well.       Past Medical History:  Diagnosis Date   Asthma    COPD (chronic obstructive pulmonary disease) (HCC)    GERD (gastroesophageal reflux disease)    Hypertension    Stroke [facility])     Past Surgical History:  Procedure Laterality Date   PACEMAKER IMPLANT N/A 09/23/2021   Procedure: PACEMAKER IMPLANT;  Surgeon: Lanier Prude, MD;  Location: [facility] INVASIVE CV LAB;  Service: Cardiovascular;  Laterality: N/A;   TUBAL LIGATION      Current Medications: Current Meds  Medication Sig   acetaminophen (TYLENOL) 500 MG tablet Take 500-1,000 mg by mouth every 6 (six) hours as needed (pain).   albuterol (PROVENTIL HFA) 108 (90 Base) MCG/ACT inhaler Inhale 2 puffs into the lungs every 6 (six) hours as needed for wheezing or shortness of breath.   ascorbic acid (VITAMIN C) 1000 MG tablet Take 1,000 mg by mouth daily.   BREO ELLIPTA 200-25 MCG/ACT AEPB Inhale 1 puff into the lungs in the morning.   Cholecalciferol 25 MCG (1000 UT) tablet Take 1,000 Units by mouth in the morning.   Cyanocobalamin (VITAMIN B-12 PO) Take 4 drops by mouth in the morning.   montelukast (SINGULAIR) 10 MG tablet Take 10 mg by mouth at bedtime.   olmesartan (BENICAR) 20 MG tablet Take 20 mg by mouth at bedtime.   Omega-3 Fatty Acids (OMEGA-3 PO) Take 1 capsule by  mouth in the morning.   pantoprazole (PROTONIX) 40 MG tablet Take 40 mg by mouth daily before breakfast.   PYRIDOXINE HCL PO Take 200 mg by mouth in the morning.   Red Yeast Rice Extract (RED YEAST RICE PO) Take 1 tablet by mouth daily.   SPIRIVA RESPIMAT 1.25 MCG/ACT AERS Inhale 2 puffs into the lungs in the morning.   [DISCONTINUED] clopidogrel (PLAVIX) 75 MG tablet Take 1 tablet (75 mg total) by mouth in the morning.   [DISCONTINUED] propranolol (INDERAL) 20 MG tablet Take 1 tablet (20 mg total) by mouth 2 (two) times daily.     Allergies:   Shellfish allergy, Zocor [simvastatin], Lasix [furosemide], Latex, and Penicillins   Social History   Socioeconomic History   Marital status: Divorced    Spouse name: Not on file   Number of children: Not on file   Years of education: Not on file   Highest education level: Not on file  Occupational History   Not on file  Tobacco Use   Smoking status: Never   Smokeless tobacco: Never  Vaping Use   Vaping Use: Never used  Substance and Sexual Activity   Alcohol use: No   Drug use: No   Sexual activity: Not on file  Other Topics Concern   Not on file  Social History Narrative   Lives with a  friend.  3 children with one deceased.     Social Determinants of Health   Financial Resource Strain: Not on file  Food Insecurity: Not on file  Transportation Needs: Not on file  Physical Activity: Not on file  Stress: Not on file  Social Connections: Not on file     Family History: The patient's family history includes Cancer in her brother, mother, and sister; Diabetes in her sister; Heart disease in her sister; Hypertension in her brother, father, and sister.  ROS:   Please see the history of present illness.     All other systems reviewed and are negative.  EKGs/Labs/Other Studies Reviewed:    The following studies were reviewed today:  (02/24/22) In clinic device interrogation personally reviewed: Presenting rhythm a paced, V  sensed Atrial pacing 95% Ventricular pacing 1% Mode switch 0% Decreased outputs to maximize battery longevity okay  Pacemaker Implant 09/23/2021: CONCLUSIONS:   1. Intermittent complete heart block and syncope  2. Dual chamber permanent pacemaker with left bundle area lead  3.  No early apparent complications.    Echo 06/07/2021: 1. Left ventricular ejection fraction, by estimation, is 60 to 65%. The  left ventricle has normal function. The left ventricle has no regional  wall motion abnormalities. There is moderate concentric left ventricular  hypertrophy. Left ventricular  diastolic parameters are consistent with Grade II diastolic dysfunction  (pseudonormalization).   2. Right ventricular systolic function is normal. The right ventricular  size is normal. There is normal pulmonary artery systolic pressure.   3. Left atrial size was mildly dilated.   4. The mitral valve is normal in structure. No evidence of mitral valve  regurgitation. No evidence of mitral stenosis.   5. The aortic valve is tricuspid. Aortic valve regurgitation is not  visualized. Aortic valve sclerosis is present, with no evidence of aortic  valve stenosis.   6. The inferior vena cava is normal in size with greater than 50%  respiratory variability, suggesting right atrial pressure of 3 mmHg.   Comparison(s): No prior Echocardiogram.    EKG:  EKG is personally reviewed.  02/24/22: EKG was not ordered.    Recent Labs: 06/06/2021: ALT 25 06/07/2021: TSH 10.269 09/12/2021: BUN 15; Creatinine, Ser 0.95; Hemoglobin 11.7; Platelets 284; Potassium 4.3; Sodium 138   Recent Lipid Panel    Component Value Date/Time   CHOL 197 06/07/2021 0401   TRIG 153 (H) 06/07/2021 0401   HDL 37 (L) 06/07/2021 0401   CHOLHDL 5.3 06/07/2021 0401   VLDL 31 06/07/2021 0401   LDLCALC 129 (H) 06/07/2021 0401    Physical Exam:    VS:  BP (!) 162/94   Pulse 80   Ht 5\' 6"  (1.676 m)   Wt 296 lb (134.3 kg)   SpO2 95%   BMI  47.78 kg/m     Wt Readings from Last 3 Encounters:  02/24/22 296 lb (134.3 kg)  11/26/21 287 lb (130.2 kg)  10/07/21 293 lb 12.8 oz (133.3 kg)     GEN: Well nourished, well developed in no acute distress HEENT: Normal NECK: No JVD; No carotid bruits LYMPHATICS: No lymphadenopathy CARDIAC: RRR, no murmurs, rubs, gallops. Device pocket well healed. RESPIRATORY:  Clear to auscultation without rales, wheezing or rhonchi  ABDOMEN: Soft, non-tender, non-distended MUSCULOSKELETAL:  No edema; No deformity  SKIN: Warm and dry NEUROLOGIC:  Alert and oriented x 3 PSYCHIATRIC:  Normal affect        ASSESSMENT:    1. Syncope, unspecified syncope type  2. History of CVA (cerebrovascular accident)   3. Pacemaker   4. Primary hypertension    PLAN:    In order of problems listed above:  #Syncope #Permanent pacemaker in situ Device functioning appropriately Continue remote monitoring  #Hypertension Above goal today.  Recommend checking blood pressures 1-2 times per week at home and recording the values.  Recommend bringing these recordings to the primary care physician.   Follow-up in 1 year with APP    Medication Adjustments/Labs and Tests Ordered: Current medicines are reviewed at length with the patient today.  Concerns regarding medicines are outlined above.   Orders Placed This Encounter  Procedures   CUP PACEART INCLINIC DEVICE CHECK   Meds ordered this encounter  Medications   clopidogrel (PLAVIX) 75 MG tablet    Sig: Take 1 tablet (75 mg total) by mouth in the morning.    Dispense:  90 tablet    Refill:  3   propranolol (INDERAL) 20 MG tablet    Sig: Take 1 tablet (20 mg total) by mouth 2 (two) times daily.    Dispense:  60 tablet    Refill:  5      Signed, Steffanie Dunn, MD, [facility], [facility] 02/24/2022 8:34 PM    Electrophysiology [facility]

## 2022-03-25 ENCOUNTER — Ambulatory Visit (INDEPENDENT_AMBULATORY_CARE_PROVIDER_SITE_OTHER): Payer: Medicare HMO

## 2022-03-25 DIAGNOSIS — I5032 Chronic diastolic (congestive) heart failure: Secondary | ICD-10-CM | POA: Diagnosis not present

## 2022-03-26 LAB — CUP PACEART REMOTE DEVICE CHECK
Battery Voltage: 95
Date Time Interrogation Session: 20231224075536
Implantable Lead Connection Status: 753985
Implantable Lead Connection Status: 753985
Implantable Lead Implant Date: 20230626
Implantable Lead Implant Date: 20230626
Implantable Lead Location: 753859
Implantable Lead Location: 753860
Implantable Lead Model: 377
Implantable Lead Model: 377
Implantable Lead Serial Number: 8000898701
Implantable Lead Serial Number: 8000923303
Implantable Pulse Generator Implant Date: 20230626
Pulse Gen Model: 407145
Pulse Gen Serial Number: 70433047

## 2022-04-02 ENCOUNTER — Ambulatory Visit: Payer: Medicare HMO | Admitting: Neurology

## 2022-04-02 ENCOUNTER — Encounter: Payer: Self-pay | Admitting: Neurology

## 2022-04-02 VITALS — BP 190/92 | HR 89 | Ht 66.5 in | Wt 293.8 lb

## 2022-04-02 DIAGNOSIS — I10 Essential (primary) hypertension: Secondary | ICD-10-CM

## 2022-04-02 DIAGNOSIS — G250 Essential tremor: Secondary | ICD-10-CM | POA: Diagnosis not present

## 2022-04-02 MED ORDER — PROPRANOLOL HCL 20 MG PO TABS: ORAL_TABLET | ORAL | 5 refills | Status: DC

## 2022-04-02 NOTE — Patient Instructions (Addendum)
Good to see you.  Increase Propranolol 20mg : take 1 tablet in AM, 2 tablets in PM. If you do not tolerate higher dose, go back to 1 tablet twice a day  2. Please contact your PCP Dr. Mitchel Honour to schedule an earlier appointment about the urinary incontinence and leg swelling. His number is [phone]  3. Follow-up in 6 months, call for any changes

## 2022-04-02 NOTE — Progress Notes (Signed)
NEUROLOGY FOLLOW UP OFFICE NOTE  Autumn Young [id number]:[id number] [birth year]  HISTORY OF PRESENT ILLNESS: I had the pleasure of seeing Autumn Young in follow-up in the neurology clinic on 04/02/2022.  The patient was last seen 4 months ago for syncope and tremor. She is alone in the office today. Records and images were personally reviewed where available.  Since her last visit, she reports one episode of syncope 5 months ago at her husband's funeral. She recalls leaning forward then family sitting her up. She states it was so quick and thinks it was related to increased emotions with his passing. She had a pacemaker placed in June 2023 after holter monitor showed a 7-second 3rd degree AVB and sinus bradycardia. She was started on Propranolol 20mg  BID by her cardiologist and does not improvement in tremor, they are "not as bad but still bad." She mostly has a right hand tremor, but handwriting is better. She uses her left hand to eat otherwise food jumps out of her right hand. For the past 2 weeks, she has been using her albuterol inhaler 5-6 times a day with her asthma acting up with house repairs. Tremors have been worse with the albuterol as well. She reports that 15-20 minutes after taking her medications, her chest hurts for 30 minutes then resolves. BP today elevated 211/91, she reports she was rushing to the appointment and there is a lot going on with a recent death in the family. She is also reporting new symptoms of urinary incontinence, she would go to get up then start urinating. She has bought adult diapers. She has been having right leg swelling, but previously it was her left leg. No falls. She does not sleep well at night and naps during the day. She aches all over.   History on Initial Assessment 07/24/2021: This is a pleasant 77 year old right-handed woman with a history of hypertension, CAD, CKD, hyperlipidemia, COPD, obesity, presenting for evaluation of syncope and tremor. The first syncopal  episode occurred on 06/06/21, she was at home doing a number game with her granddaughter when all of a sudden she had had "hard shaking" different from her typical tremor, she could see her body shaking real fast and could her hear her granddaughter saying she was scared and she could not see her grandmother's eyes. She called to her housemate to call 911, she continued to see her body shaking and then vomited. She felt like she was out of breath, "like I did not have air," and thinks she lost consciousness, feeling herself go away and when she came back she could still see her body shaking. No diaphoresis, tongue bite, or incontinence. Her granddaughter got her the phone and the patient dialed 911 and spoke to EMS. She had a headache and when she lay her head back, head was swimming. When they arrived, she was still vomiting but the hard shaking was gone. She had a spinning sensation when she lay on her back. She was "bobbing around" when they tried to stand her up, she was diffusely weak. No tongue bite or incontinence. She had diarrhea in the ER and while in the hospital, she reported feeling ill the past month with intermittent nausea and vomiting, however today states that she had been feeling fine that day and had eaten breakfast earlier in the morning. She had a brain MRI without contrast which I personally reviewed, no acute changes. CTA head and neck no significant stenosis. Her routine EEG was normal. Echocardiogram  showed an EF of 60-65% with grade II diastolic dysfunction. She had bradycardia with second degree AV block on telemetry. During her admission, she reported the head tremor had been ongoing for 3 years and had been worsening, so she was referred to Neurology.   She reports having another milder episode 2 weeks ago while her other granddaughter was sitting on her lap. She recalls that she started coughing then passed out for a minute, when she came to, her granddaughter was slapping her face  saying she could not see her eyes. She did not have the hard shaking, vomiting, difficulty breathing, dizziness, or fatigue she had that time so she did not seek medical attention. She lives with a housemate and her granddaughter. She has not been told of any staring/unresponsive episodes, she denies any other gaps in time, no olfactory/gustatory hallucinations, focal numbness/tingling/weakness, myoclonic jerks. She has had intermittent headaches. Since the incident in March, every time she lays down at night, she has vertigo, she turns over or stands up and it resolves, no nausea/vomiting. No diplopia, dysarthria/dysphagia, neck/back pain, constipation, anosmia. She has right shoulder pain. She has noticed irregular palpitations for a time now, she has the heart monitor equipment but has not worn it yet. Head tremors started around 3 years ago. Her right hand is also affected, affecting writing and using utensils. She states her left hand and legs are not affected. Her maternal grandfather, maternal aunt, and son "shook all the time." Her paternal grandfather had shaking, her older sister is beginning to shake a little bit. Her son had seizures in childhood and passed away from a seizure. She denies any significant head injuries, CNS infections.   PAST MEDICAL HISTORY: Past Medical History:  Diagnosis Date   Asthma    COPD (chronic obstructive pulmonary disease) (HCC)    GERD (gastroesophageal reflux disease)    Hypertension    Stroke [facility])     MEDICATIONS: Current Outpatient Medications on File Prior to Visit  Medication Sig Dispense Refill   acetaminophen (TYLENOL) 500 MG tablet Take 500-1,000 mg by mouth every 6 (six) hours as needed (pain).     albuterol (PROVENTIL HFA) 108 (90 Base) MCG/ACT inhaler Inhale 2 puffs into the lungs every 6 (six) hours as needed for wheezing or shortness of breath. 18 g 0   ascorbic acid (VITAMIN C) 1000 MG tablet Take 1,000 mg by mouth daily.     BREO ELLIPTA  200-25 MCG/ACT AEPB Inhale 1 puff into the lungs in the morning.     Cholecalciferol 25 MCG (1000 UT) tablet Take 1,000 Units by mouth in the morning.     clopidogrel (PLAVIX) 75 MG tablet Take 1 tablet (75 mg total) by mouth in the morning. 90 tablet 3   Cyanocobalamin (VITAMIN B-12 PO) Take 4 drops by mouth in the morning.     montelukast (SINGULAIR) 10 MG tablet Take 10 mg by mouth at bedtime.     olmesartan (BENICAR) 20 MG tablet Take 20 mg by mouth at bedtime.     Omega-3 Fatty Acids (OMEGA-3 PO) Take 1 capsule by mouth in the morning.     pantoprazole (PROTONIX) 40 MG tablet Take 40 mg by mouth daily before breakfast.     propranolol (INDERAL) 20 MG tablet Take 1 tablet (20 mg total) by mouth 2 (two) times daily. 60 tablet 5   PYRIDOXINE HCL PO Take 200 mg by mouth in the morning.     Red Yeast Rice Extract (RED YEAST RICE PO)  Take 1 tablet by mouth daily.     SPIRIVA RESPIMAT 1.25 MCG/ACT AERS Inhale 2 puffs into the lungs in the morning.     No current facility-administered medications on file prior to visit.    ALLERGIES: Allergies  Allergen Reactions   Shellfish Allergy Anaphylaxis   Zocor [Simvastatin] Swelling   Lasix [Furosemide] Swelling   Latex     rash   Penicillins Rash    Has patient had a PCN reaction causing immediate rash, facial/tongue/throat swelling, SOB or lightheadedness with hypotension: Unknown Has patient had a PCN reaction causing severe rash involving mucus membranes or skin necrosis: No Has patient had a PCN reaction that required hospitalization: No Has patient had a PCN reaction occurring within the last 10 years: No If all of the above answers are "NO", then may proceed with Cephalosporin use.     FAMILY HISTORY: Family History  Problem Relation Age of Onset   Cancer Mother        Lung, cervix, mouth   Hypertension Father    Cancer Sister        Colon   Diabetes Sister    Hypertension Sister    Heart disease Sister    Cancer Brother     Hypertension Brother     SOCIAL HISTORY: Social History   Socioeconomic History   Marital status: Divorced    Spouse name: Not on file   Number of children: Not on file   Years of education: Not on file   Highest education level: Not on file  Occupational History   Not on file  Tobacco Use   Smoking status: Never   Smokeless tobacco: Never  Vaping Use   Vaping Use: Never used  Substance and Sexual Activity   Alcohol use: No   Drug use: No   Sexual activity: Not on file  Other Topics Concern   Not on file  Social History Narrative   Lives with a friend.  3 children with one deceased.     Social Determinants of Health   Financial Resource Strain: Not on file  Food Insecurity: Not on file  Transportation Needs: Not on file  Physical Activity: Not on file  Stress: Not on file  Social Connections: Not on file  Intimate Partner Violence: Not on file     PHYSICAL EXAM: Vitals:   04/02/22 0959 04/02/22 1034  BP: (!) 211/91 (!) 190/92  Pulse: 89   SpO2: 100%    General: No acute distress Head:  Normocephalic/atraumatic Skin/Extremities: No rash, no edema Neurological Exam: alert and awake. No aphasia or dysarthria. Fund of knowledge is appropriate. Attention and concentration are normal.   Cranial nerves: Pupils equal, round. Extraocular movements intact with no nystagmus. Visual fields full.  No facial asymmetry.  Motor: Bulk and tone normal, muscle strength 5/5 throughout with no pronator drift.   Finger to nose testing intact.  Gait slow and cautious, no ataxia.  Movement examination: Tone: There is no increased tone in the extremities Abnormal movements: side to side head tremor, no resting tremor in extremities. There is very mild bilateral postural and endpoint tremor today Coordination:  There is no decremation with RAMs, including alternating supination and pronation of the forearm, hand opening and closing, finger taps   IMPRESSION: This is a pleasant 77 yo  RH woman with a history of hypertension, CAD, CKD, hyperlipidemia, COPD, obesity, who presented for syncope and essential tremor. Syncope found due to complete heart block. She reports one fainting episode at  her husband's funeral in August. She is on Propranolol for essential tremor, we discussed increasing dose to 20mg  in AM, 40mg  in PM. Monitor for side effects. BP today elevated. She also reports new symptoms of urinary incontinence and leg swelling, advised to call her PCP. Follow-up in 6 months, call for any changes.   Thank you for allowing me to participate in her care.  Please do not hesitate to call for any questions or concerns.    Ellouise Newer, M.D.

## 2022-04-07 ENCOUNTER — Telehealth: Payer: Self-pay

## 2022-04-07 NOTE — Telephone Encounter (Signed)
The patient is scheduled for an HM follow-up on Apr 05, 2022 The HM follow-up transmission has not arrived yet. The time of arrival depends on the HM transmission time of the implanted device.

## 2022-04-08 NOTE — Telephone Encounter (Signed)
91 day transmission received on 03/23/2022 as scheduled.  NO action needed.

## 2022-04-21 NOTE — Progress Notes (Signed)
Remote pacemaker transmission.   

## 2022-05-13 ENCOUNTER — Emergency Department (HOSPITAL_BASED_OUTPATIENT_CLINIC_OR_DEPARTMENT_OTHER)
Admission: EM | Admit: 2022-05-13 | Discharge: 2022-05-14 | Disposition: A | Discharge status: Home / Self Care | Payer: Medicare HMO | Attending: Emergency Medicine | Admitting: Emergency Medicine

## 2022-05-13 ENCOUNTER — Other Ambulatory Visit: Payer: Self-pay

## 2022-05-13 ENCOUNTER — Emergency Department (HOSPITAL_BASED_OUTPATIENT_CLINIC_OR_DEPARTMENT_OTHER): Payer: Medicare HMO

## 2022-05-13 DIAGNOSIS — Z91040 Latex allergy status: Secondary | ICD-10-CM | POA: Insufficient documentation

## 2022-05-13 DIAGNOSIS — I10 Essential (primary) hypertension: Secondary | ICD-10-CM | POA: Diagnosis not present

## 2022-05-13 DIAGNOSIS — U071 COVID-19: Secondary | ICD-10-CM | POA: Insufficient documentation

## 2022-05-13 DIAGNOSIS — Z7901 Long term (current) use of anticoagulants: Secondary | ICD-10-CM | POA: Insufficient documentation

## 2022-05-13 DIAGNOSIS — J449 Chronic obstructive pulmonary disease, unspecified: Secondary | ICD-10-CM | POA: Insufficient documentation

## 2022-05-13 DIAGNOSIS — Z950 Presence of cardiac pacemaker: Secondary | ICD-10-CM | POA: Insufficient documentation

## 2022-05-13 DIAGNOSIS — Z79899 Other long term (current) drug therapy: Secondary | ICD-10-CM | POA: Diagnosis not present

## 2022-05-13 DIAGNOSIS — R0602 Shortness of breath: Secondary | ICD-10-CM | POA: Diagnosis present

## 2022-05-13 LAB — CBC
HCT: 37.8 % (ref 36.0–46.0)
Hemoglobin: 12 g/dL (ref 12.0–15.0)
MCH: 28 pg (ref 26.0–34.0)
MCHC: 31.7 g/dL (ref 30.0–36.0)
MCV: 88.3 fL (ref 80.0–100.0)
Platelets: 339 10*3/uL (ref 150–400)
RBC: 4.28 MIL/uL (ref 3.87–5.11)
RDW: 12.7 % (ref 11.5–15.5)
WBC: 8.4 10*3/uL (ref 4.0–10.5)
nRBC: 0 % (ref 0.0–0.2)

## 2022-05-13 LAB — BASIC METABOLIC PANEL
Anion gap: 7 (ref 5–15)
BUN: 15 mg/dL (ref 8–23)
CO2: 24 mmol/L (ref 22–32)
Calcium: 8.2 mg/dL — ABNORMAL LOW (ref 8.9–10.3)
Chloride: 105 mmol/L (ref 98–111)
Creatinine, Ser: 0.94 mg/dL (ref 0.44–1.00)
GFR, Estimated: >60 mL/min (ref >60)
Glucose, Bld: 131 mg/dL — ABNORMAL HIGH (ref 70–99)
Potassium: 3.7 mmol/L (ref 3.5–5.1)
Sodium: 136 mmol/L (ref 135–145)

## 2022-05-13 LAB — TROPONIN I (HIGH SENSITIVITY)
Troponin I (High Sensitivity): 5 ng/L (ref <18)
Troponin I (High Sensitivity): 5 ng/L (ref <18)

## 2022-05-13 LAB — RESP PANEL BY RT-PCR (RSV, FLU A&B, COVID)  RVPGX2
Influenza A by PCR: NEGATIVE
Influenza B by PCR: NEGATIVE
Resp Syncytial Virus by PCR: NEGATIVE
SARS Coronavirus 2 by RT PCR: POSITIVE — AB

## 2022-05-13 LAB — BRAIN NATRIURETIC PEPTIDE: B Natriuretic Peptide: 54.8 pg/mL (ref 0.0–100.0)

## 2022-05-13 MED ORDER — ALBUTEROL SULFATE HFA 108 (90 BASE) MCG/ACT IN AERS
2.0000 | INHALATION_SPRAY | Freq: Four times a day (QID) | RESPIRATORY_TRACT | Status: DC | PRN
  Filled 2022-05-13: qty 6.7

## 2022-05-13 MED ORDER — IPRATROPIUM-ALBUTEROL 0.5-2.5 (3) MG/3ML IN SOLN
3.0000 mL | Freq: Once | RESPIRATORY_TRACT | Status: AC
  Administered 2022-05-13: 3 mL via RESPIRATORY_TRACT
  Filled 2022-05-13: qty 3

## 2022-05-13 NOTE — ED Provider Notes (Signed)
[place] [facility] Provider Note   CSN: [id number] Arrival date & time: 05/13/22  1938     History  Chief Complaint  Patient presents with   Shortness of Breath    Autumn Young is a 77 y.o. female.  Patient presenting with a complaint of cough, shortness of breath feeling for over a week.  Family member had a flulike illness.  And she seems to have gotten the same thing.  Also has a productive cough.  Patient has a history of COPD is on lots of inhalers but her albuterol inhaler is currently out.  Patient also has a pacemaker.  Patient followed by cardiology seen by them in February 24, 2022.  She is post to be on Plavix and post be on Inderal for blood pressure.  But she claims she has not been on any blood pressure medicine for 6 months.  Patient denies any nausea or vomiting.  Patient's oxygen saturation here on room air is 97%.  No wheezing.  Medical history significant for COPD hypertension pacemaker gastroesophageal reflux disease history of stroke as well.  Has never used tobacco products.       Home Medications Prior to Admission medications   Medication Sig Start Date End Date Taking? Authorizing Provider  clopidogrel (PLAVIX) 75 MG tablet Take 1 tablet (75 mg total) by mouth daily. 05/13/22  Yes Vanetta Mulders, MD  propranolol (INDERAL) 20 MG tablet Take 1 tablet (20 mg total) by mouth 2 (two) times daily. 05/13/22  Yes Vanetta Mulders, MD  acetaminophen (TYLENOL) 500 MG tablet Take 500-1,000 mg by mouth every 6 (six) hours as needed (pain).    [provider]  albuterol (PROVENTIL HFA) 108 (90 Base) MCG/ACT inhaler Inhale 2 puffs into the lungs every 6 (six) hours as needed for wheezing or shortness of breath. 08/13/17   Hongalgi, Maximino Greenland, MD  ascorbic acid (VITAMIN C) 1000 MG tablet Take 1,000 mg by mouth daily. 05/14/18   [provider]  BREO ELLIPTA 200-25 MCG/ACT AEPB Inhale 1 puff into the lungs in the morning.  05/18/21   [provider]  Cholecalciferol 25 MCG (1000 UT) tablet Take 1,000 Units by mouth in the morning.    [provider]  clopidogrel (PLAVIX) 75 MG tablet Take 1 tablet (75 mg total) by mouth in the morning. 02/24/22   Lanier Prude, MD  Cyanocobalamin (VITAMIN B-12 PO) Take 4 drops by mouth in the morning.    [provider]  montelukast (SINGULAIR) 10 MG tablet Take 10 mg by mouth at bedtime. 03/26/21   [provider]  olmesartan (BENICAR) 20 MG tablet Take 20 mg by mouth at bedtime. 04/21/21   [provider]  Omega-3 Fatty Acids (OMEGA-3 PO) Take 1 capsule by mouth in the morning.    [provider]  pantoprazole (PROTONIX) 40 MG tablet Take 40 mg by mouth daily before breakfast. 09/27/15   [provider]  propranolol (INDERAL) 20 MG tablet Take 1 tablet in AM, 2 tablets in PM 04/02/22   Van Clines, MD  PYRIDOXINE HCL PO Take 200 mg by mouth in the morning.    [provider]  Red Yeast Rice Extract (RED YEAST RICE PO) Take 1 tablet by mouth daily.    [provider]  SPIRIVA RESPIMAT 1.25 MCG/ACT AERS Inhale 2 puffs into the lungs in the morning. 05/13/21   [provider]      Allergies  Shellfish allergy, Zocor [simvastatin], Lasix [furosemide], Latex, and Penicillins    Review of Systems   Review of Systems  Constitutional:  Negative for chills and fever.  HENT:  Positive for congestion. Negative for ear pain and sore throat.   Eyes:  Negative for pain and visual disturbance.  Respiratory:  Positive for cough and shortness of breath.   Cardiovascular:  Negative for chest pain and palpitations.  Gastrointestinal:  Negative for abdominal pain and vomiting.  Genitourinary:  Negative for dysuria and hematuria.  Musculoskeletal:  Negative for arthralgias and back pain.  Skin:  Negative for color change and rash.  Neurological:  Negative for seizures and syncope.  All other  systems reviewed and are negative.   Physical Exam Updated Vital Signs BP (!) 199/86   Pulse 75   Temp 98.5 F (36.9 C) (Oral)   Resp 17   Wt 133.8 kg   SpO2 92%   BMI 46.90 kg/m  Physical Exam Vitals and nursing note reviewed.  Constitutional:      General: She is not in acute distress.    Appearance: Normal appearance. She is well-developed.  HENT:     Head: Normocephalic and atraumatic.  Eyes:     Extraocular Movements: Extraocular movements intact.     Conjunctiva/sclera: Conjunctivae normal.     Pupils: Pupils are equal, round, and reactive to light.  Cardiovascular:     Rate and Rhythm: Normal rate and regular rhythm.     Heart sounds: No murmur heard. Pulmonary:     Effort: Pulmonary effort is normal. No respiratory distress.     Breath sounds: Normal breath sounds. No wheezing, rhonchi or rales.  Abdominal:     Palpations: Abdomen is soft.     Tenderness: There is no abdominal tenderness.  Musculoskeletal:        General: No swelling.     Cervical back: Normal range of motion and neck supple.     Right lower leg: No edema.     Left lower leg: No edema.  Skin:    General: Skin is warm and dry.     Capillary Refill: Capillary refill takes less than 2 seconds.  Neurological:     General: No focal deficit present.     Mental Status: She is alert and oriented to person, place, and time.  Psychiatric:        Mood and Affect: Mood normal.     ED Results / Procedures / Treatments   Labs (all labs ordered are listed, but only abnormal results are displayed) Labs Reviewed  RESP PANEL BY RT-PCR (RSV, FLU A&B, COVID)  RVPGX2 - Abnormal; Notable for the following components:      Result Value   SARS Coronavirus 2 by RT PCR POSITIVE (*)    All other components within normal limits  BASIC METABOLIC PANEL - Abnormal; Notable for the following components:   Glucose, Bld 131 (*)    Calcium 8.2 (*)    All other components within normal limits  CBC  BRAIN  NATRIURETIC PEPTIDE  TROPONIN I (HIGH SENSITIVITY)  TROPONIN I (HIGH SENSITIVITY)    EKG EKG Interpretation  Date/Time:  Tuesday May 13 2022 19:53:24 EST Ventricular Rate:  73 PR Interval:  163 QRS Duration: 86 QT Interval:  406 QTC Calculation: 448 R Axis:   28 Text Interpretation: Sinus rhythm Atrial premature complex Low voltage, precordial leads Confirmed by Vanetta Mulders [phone]) on 05/13/2022 9:39:47 PM  Radiology DG Chest 2 View  Result Date: 05/13/2022  CLINICAL DATA:  Shortness of breath. EXAM: CHEST - 2 VIEW COMPARISON:  09/24/2021. FINDINGS: The heart size and mediastinal contours are within normal limits. Peribronchial cuffing is noted bilaterally. No consolidation, effusion, or pneumothorax. A dual lead pacemaker device is present over the left chest. Degenerative changes are present in the thoracic spine. No acute osseous abnormality. IMPRESSION: Peribronchial cuffing bilaterally, possible bronchitis. Electronically Signed   By: Thornell Sartorius M.D.   On: 05/13/2022 20:19    Procedures Procedures    Medications Ordered in ED Medications  albuterol (VENTOLIN HFA) 108 (90 Base) MCG/ACT inhaler 2 puff (has no administration in time range)  ipratropium-albuterol (DUONEB) 0.5-2.5 (3) MG/3ML nebulizer solution 3 mL (3 mLs Nebulization Given 05/13/22 2134)    ED Course/ Medical Decision Making/ A&P                             Medical Decision Making Amount and/or Complexity of Data Reviewed Labs: ordered. Radiology: ordered.  Risk Prescription drug management.   Patient's respiratory panel positive for COVID as no surprise.  Patient's BNP not elevated chest x-ray negative for pneumonia negative for CHF.  We did give her a breathing treatment just since she is got a history of COPD it made her feel a little bit better.  Patient is out of her albuterol inhaler at home.  Will provide that here to go.  Otherwise it will be over-the-counter cold and flu medicines.   Patient's symptoms already been going on for over a week so Paxlovid is not recommended at this point in time.  Patient will also have her Plavix and her propranolol renewed.  Prescriptions printed.  Consistently she had trouble getting them from [facility].  Oxygen saturations are good here patient nontoxic no acute distress.  Patient given precautions regarding the COVID.  Patient will follow-up with her primary care doctor to recheck her blood pressure.  Pressure markedly elevated 170/64.  No fevers.   Final Clinical Impression(s) / ED Diagnoses Final diagnoses:  COVID  Primary hypertension    Rx / DC Orders ED Discharge Orders          Ordered    clopidogrel (PLAVIX) 75 MG tablet  Daily        05/14/22 0000    propranolol (INDERAL) 20 MG tablet  2 times daily        05/14/22 0000              Vanetta Mulders, MD 05/14/22 0005

## 2022-05-13 NOTE — ED Triage Notes (Signed)
Pt arrives with c/o SOB that started 2 weeks ago. Pt endorses issues laying flat. Pt unable to get her home meds. Pt endorse productive cough. Pt has hx of COPD and pacemaker.

## 2022-05-14 MED ORDER — CLOPIDOGREL BISULFATE 75 MG PO TABS: 75.0000 mg | ORAL_TABLET | Freq: Every day | ORAL | 2 refills | Status: DC

## 2022-05-14 MED ORDER — PROPRANOLOL HCL 20 MG PO TABS: 20.0000 mg | ORAL_TABLET | Freq: Two times a day (BID) | ORAL | 2 refills | Status: DC

## 2022-05-14 NOTE — Discharge Instructions (Signed)
Tested positive today for COVID.  Use your albuterol inhaler 2 puffs every 6 hours for 7 days.  Over-the-counter cold and flu medicines as needed.  Follow-up with your primary care doctor for blood pressure rechecks.  I renewed your Plavix and your Inderal.  Return for any new or worse breathing problems but probably will do okay since you have already had symptoms for a little bit over a week.

## 2022-05-16 ENCOUNTER — Telehealth: Payer: Self-pay | Admitting: Neurology

## 2022-05-16 NOTE — Telephone Encounter (Signed)
Pt called in and left a message. She stated she has been trying to get the pharmacy to fill her Olmesartan prescription, but they are waiting on our office to approve it.

## 2022-05-16 NOTE — Telephone Encounter (Signed)
Pt called informed that this is a  blood pressure medication. She should call her PCP to refill if appropriate.

## 2022-05-16 NOTE — Telephone Encounter (Signed)
I do not prescribe olmesartan, pls f/u with PCP or Cardiology for refills of this, thanks

## 2022-06-09 ENCOUNTER — Encounter: Payer: Self-pay | Admitting: Cardiology

## 2022-06-24 ENCOUNTER — Ambulatory Visit (INDEPENDENT_AMBULATORY_CARE_PROVIDER_SITE_OTHER): Payer: Medicare HMO

## 2022-06-24 DIAGNOSIS — R55 Syncope and collapse: Secondary | ICD-10-CM

## 2022-06-24 LAB — CUP PACEART REMOTE DEVICE CHECK
Battery Voltage: 90
Date Time Interrogation Session: 20240326110050
Implantable Lead Connection Status: 753985
Implantable Lead Connection Status: 753985
Implantable Lead Implant Date: 20230626
Implantable Lead Implant Date: 20230626
Implantable Lead Location: 753859
Implantable Lead Location: 753860
Implantable Lead Model: 377
Implantable Lead Model: 377
Implantable Lead Serial Number: 8000898701
Implantable Lead Serial Number: 8000923303
Implantable Pulse Generator Implant Date: 20230626
Pulse Gen Model: 407145
Pulse Gen Serial Number: 70433047

## 2022-07-22 ENCOUNTER — Inpatient Hospital Stay (HOSPITAL_BASED_OUTPATIENT_CLINIC_OR_DEPARTMENT_OTHER)
Admission: EM | Admit: 2022-07-22 | Discharge: 2022-07-26 | DRG: 202 | Disposition: A | Discharge status: Home / Self Care | Payer: Medicare HMO | Attending: Internal Medicine | Admitting: Internal Medicine

## 2022-07-22 ENCOUNTER — Encounter (HOSPITAL_BASED_OUTPATIENT_CLINIC_OR_DEPARTMENT_OTHER): Payer: Self-pay | Admitting: Urology

## 2022-07-22 ENCOUNTER — Emergency Department (HOSPITAL_BASED_OUTPATIENT_CLINIC_OR_DEPARTMENT_OTHER): Payer: Medicare HMO

## 2022-07-22 ENCOUNTER — Other Ambulatory Visit: Payer: Self-pay

## 2022-07-22 DIAGNOSIS — R0602 Shortness of breath: Secondary | ICD-10-CM | POA: Diagnosis not present

## 2022-07-22 DIAGNOSIS — Z91040 Latex allergy status: Secondary | ICD-10-CM

## 2022-07-22 DIAGNOSIS — Z7951 Long term (current) use of inhaled steroids: Secondary | ICD-10-CM

## 2022-07-22 DIAGNOSIS — E782 Mixed hyperlipidemia: Secondary | ICD-10-CM | POA: Diagnosis present

## 2022-07-22 DIAGNOSIS — Z7902 Long term (current) use of antithrombotics/antiplatelets: Secondary | ICD-10-CM

## 2022-07-22 DIAGNOSIS — R051 Acute cough: Secondary | ICD-10-CM

## 2022-07-22 DIAGNOSIS — Z6841 Body Mass Index (BMI) 40.0 and over, adult: Secondary | ICD-10-CM

## 2022-07-22 DIAGNOSIS — Z91013 Allergy to seafood: Secondary | ICD-10-CM

## 2022-07-22 DIAGNOSIS — I272 Pulmonary hypertension, unspecified: Secondary | ICD-10-CM | POA: Diagnosis present

## 2022-07-22 DIAGNOSIS — Z8673 Personal history of transient ischemic attack (TIA), and cerebral infarction without residual deficits: Secondary | ICD-10-CM

## 2022-07-22 DIAGNOSIS — G4733 Obstructive sleep apnea (adult) (pediatric): Secondary | ICD-10-CM | POA: Diagnosis present

## 2022-07-22 DIAGNOSIS — J45901 Unspecified asthma with (acute) exacerbation: Secondary | ICD-10-CM | POA: Diagnosis not present

## 2022-07-22 DIAGNOSIS — Z888 Allergy status to other drugs, medicaments and biological substances status: Secondary | ICD-10-CM

## 2022-07-22 DIAGNOSIS — Z880 Allergy status to penicillin: Secondary | ICD-10-CM

## 2022-07-22 DIAGNOSIS — Z833 Family history of diabetes mellitus: Secondary | ICD-10-CM

## 2022-07-22 DIAGNOSIS — I10 Essential (primary) hypertension: Secondary | ICD-10-CM | POA: Diagnosis present

## 2022-07-22 DIAGNOSIS — Z1152 Encounter for screening for COVID-19: Secondary | ICD-10-CM

## 2022-07-22 DIAGNOSIS — Z8249 Family history of ischemic heart disease and other diseases of the circulatory system: Secondary | ICD-10-CM

## 2022-07-22 DIAGNOSIS — J9601 Acute respiratory failure with hypoxia: Secondary | ICD-10-CM | POA: Diagnosis present

## 2022-07-22 DIAGNOSIS — K219 Gastro-esophageal reflux disease without esophagitis: Secondary | ICD-10-CM | POA: Diagnosis present

## 2022-07-22 DIAGNOSIS — R0902 Hypoxemia: Secondary | ICD-10-CM

## 2022-07-22 DIAGNOSIS — Z79899 Other long term (current) drug therapy: Secondary | ICD-10-CM

## 2022-07-22 DIAGNOSIS — J441 Chronic obstructive pulmonary disease with (acute) exacerbation: Secondary | ICD-10-CM | POA: Diagnosis present

## 2022-07-22 LAB — COMPREHENSIVE METABOLIC PANEL
ALT: 21 U/L (ref 0–44)
AST: 34 U/L (ref 15–41)
Albumin: 3.3 g/dL — ABNORMAL LOW (ref 3.5–5.0)
Alkaline Phosphatase: 82 U/L (ref 38–126)
Anion gap: 7 (ref 5–15)
BUN: 15 mg/dL (ref 8–23)
CO2: 28 mmol/L (ref 22–32)
Calcium: 8.6 mg/dL — ABNORMAL LOW (ref 8.9–10.3)
Chloride: 102 mmol/L (ref 98–111)
Creatinine, Ser: 0.93 mg/dL (ref 0.44–1.00)
GFR, Estimated: >60 mL/min (ref >60)
Glucose, Bld: 115 mg/dL — ABNORMAL HIGH (ref 70–99)
Potassium: 3.7 mmol/L (ref 3.5–5.1)
Sodium: 137 mmol/L (ref 135–145)
Total Bilirubin: 0.2 mg/dL — ABNORMAL LOW (ref 0.3–1.2)
Total Protein: 8.2 g/dL — ABNORMAL HIGH (ref 6.5–8.1)

## 2022-07-22 LAB — CBC WITH DIFFERENTIAL/PLATELET
Abs Immature Granulocytes: 0.02 10*3/uL (ref 0.00–0.07)
Basophils Absolute: 0.1 10*3/uL (ref 0.0–0.1)
Basophils Relative: 1 %
Eosinophils Absolute: 0.3 10*3/uL (ref 0.0–0.5)
Eosinophils Relative: 5 %
HCT: 37.2 % (ref 36.0–46.0)
Hemoglobin: 11.6 g/dL — ABNORMAL LOW (ref 12.0–15.0)
Immature Granulocytes: 0 %
Lymphocytes Relative: 30 %
Lymphs Abs: 1.9 10*3/uL (ref 0.7–4.0)
MCH: 27.4 pg (ref 26.0–34.0)
MCHC: 31.2 g/dL (ref 30.0–36.0)
MCV: 87.7 fL (ref 80.0–100.0)
Monocytes Absolute: 0.7 10*3/uL (ref 0.1–1.0)
Monocytes Relative: 11 %
Neutro Abs: 3.4 10*3/uL (ref 1.7–7.7)
Neutrophils Relative %: 53 %
Platelets: 295 10*3/uL (ref 150–400)
RBC: 4.24 MIL/uL (ref 3.87–5.11)
RDW: 13 % (ref 11.5–15.5)
WBC: 6.4 10*3/uL (ref 4.0–10.5)
nRBC: 0 % (ref 0.0–0.2)

## 2022-07-22 LAB — RESP PANEL BY RT-PCR (RSV, FLU A&B, COVID)  RVPGX2
Influenza A by PCR: NEGATIVE
Influenza B by PCR: NEGATIVE
Resp Syncytial Virus by PCR: NEGATIVE
SARS Coronavirus 2 by RT PCR: NEGATIVE

## 2022-07-22 LAB — BRAIN NATRIURETIC PEPTIDE: B Natriuretic Peptide: 68 pg/mL (ref 0.0–100.0)

## 2022-07-22 LAB — TROPONIN I (HIGH SENSITIVITY)
Troponin I (High Sensitivity): 6 ng/L (ref <18)
Troponin I (High Sensitivity): 3 ng/L (ref <18)

## 2022-07-22 MED ORDER — IPRATROPIUM-ALBUTEROL 0.5-2.5 (3) MG/3ML IN SOLN
RESPIRATORY_TRACT | Status: AC
  Administered 2022-07-22: 3 mL via RESPIRATORY_TRACT
  Filled 2022-07-22: qty 3

## 2022-07-22 MED ORDER — IPRATROPIUM BROMIDE 0.02 % IN SOLN
RESPIRATORY_TRACT | Status: AC
  Administered 2022-07-22: 0.5 mg via RESPIRATORY_TRACT
  Filled 2022-07-22: qty 2.5

## 2022-07-22 MED ORDER — SODIUM CHLORIDE 0.9 % IV SOLN
500.0000 mg | Freq: Once | INTRAVENOUS | Status: AC
  Administered 2022-07-22: 500 mg via INTRAVENOUS
  Filled 2022-07-22: qty 5

## 2022-07-22 MED ORDER — ALBUTEROL (5 MG/ML) CONTINUOUS INHALATION SOLN: 10.0000 mg/h | INHALATION_SOLUTION | RESPIRATORY_TRACT | Status: AC

## 2022-07-22 MED ORDER — ALBUTEROL SULFATE (2.5 MG/3ML) 0.083% IN NEBU
INHALATION_SOLUTION | RESPIRATORY_TRACT | Status: AC
  Administered 2022-07-22: 5 mg via RESPIRATORY_TRACT
  Filled 2022-07-22: qty 6

## 2022-07-22 MED ORDER — METHYLPREDNISOLONE SODIUM SUCC 125 MG IJ SOLR
125.0000 mg | Freq: Every day | INTRAMUSCULAR | Status: DC
  Administered 2022-07-22 – 2022-07-26 (×5): 125 mg via INTRAVENOUS
  Filled 2022-07-22 (×5): qty 2

## 2022-07-22 MED ORDER — MAGNESIUM SULFATE 2 GM/50ML IV SOLN
2.0000 g | Freq: Once | INTRAVENOUS | Status: AC
  Administered 2022-07-22: 2 g via INTRAVENOUS
  Filled 2022-07-22: qty 50

## 2022-07-22 MED ORDER — ALBUTEROL SULFATE (2.5 MG/3ML) 0.083% IN NEBU: 5.0000 mg | INHALATION_SOLUTION | Freq: Once | RESPIRATORY_TRACT | Status: AC

## 2022-07-22 MED ORDER — IPRATROPIUM-ALBUTEROL 0.5-2.5 (3) MG/3ML IN SOLN: 3.0000 mL | Freq: Once | RESPIRATORY_TRACT | Status: AC

## 2022-07-22 MED ORDER — IOHEXOL 350 MG/ML SOLN
75.0000 mL | Freq: Once | INTRAVENOUS | Status: AC | PRN
  Administered 2022-07-23: 75 mL via INTRAVENOUS

## 2022-07-22 MED ORDER — ALBUTEROL (5 MG/ML) CONTINUOUS INHALATION SOLN
INHALATION_SOLUTION | RESPIRATORY_TRACT | Status: AC
  Administered 2022-07-22: 10 mg/h via RESPIRATORY_TRACT
  Filled 2022-07-22: qty 20

## 2022-07-22 MED ORDER — IPRATROPIUM BROMIDE 0.02 % IN SOLN: 0.5000 mg | Freq: Once | RESPIRATORY_TRACT | Status: AC

## 2022-07-22 NOTE — ED Provider Notes (Signed)
[place] [facility] Provider Note   CSN: [id number] Arrival date & time: 07/22/22  2021     History  Chief Complaint  Patient presents with   Wheezing    Autumn Young is a 77 y.o. female.  HPI     77yo female with history of hypertension, GERD, asthma, COPD, asthma, CVA who presents with concern for shortness of breath.  Reports she has had cough and dyspnea for the past 2 weeks. She called her pulmonary doctor, but was unable to get an appointment until June.  Due to her worsening symptoms of shortness of breath, she came to the emergency department for further evaluation.  Reports that the cough and shortness of breath have both been progressive over the past 2 weeks.  She reports wheezing.  Earlier today she felt so short of breath that she put on her own CPAP.  Denies fever, chest pain, nausea, vomiting, abdominal pain.  Reports that the cough is productive of yellow sputum at this point.  Reports that he does feel some relief with breathing treatments.  Denies congestion, sore throat.  Reports that her great granddaughter was sick.  Past Medical History:  Diagnosis Date   Asthma    COPD (chronic obstructive pulmonary disease)    GERD (gastroesophageal reflux disease)    Hypertension    Stroke      Home Medications Prior to Admission medications   Medication Sig Start Date End Date Taking? Authorizing Provider  acetaminophen (TYLENOL) 500 MG tablet Take 500-1,000 mg by mouth every 6 (six) hours as needed (pain).    [provider]  albuterol (PROVENTIL HFA) 108 (90 Base) MCG/ACT inhaler Inhale 2 puffs into the lungs every 6 (six) hours as needed for wheezing or shortness of breath. 08/13/17   Hongalgi, Maximino Greenland, MD  ascorbic acid (VITAMIN C) 1000 MG tablet Take 1,000 mg by mouth daily. 05/14/18   [provider]  BREO ELLIPTA 200-25 MCG/ACT AEPB Inhale 1 puff into the lungs in the morning. 05/18/21   [provider]  Cholecalciferol 25 MCG (1000 UT) tablet Take 1,000 Units by mouth in the morning.    [provider]  clopidogrel (PLAVIX) 75 MG tablet Take 1 tablet (75 mg total) by mouth in the morning. 02/24/22   Lanier Prude, MD  clopidogrel (PLAVIX) 75 MG tablet Take 1 tablet (75 mg total) by mouth daily. 05/13/22   Vanetta Mulders, MD  Cyanocobalamin (VITAMIN B-12 PO) Take 4 drops by mouth in the morning.    [provider]  montelukast (SINGULAIR) 10 MG tablet Take 10 mg by mouth at bedtime. 03/26/21   [provider]  olmesartan (BENICAR) 20 MG tablet Take 20 mg by mouth at bedtime. 04/21/21   [provider]  Omega-3 Fatty Acids (OMEGA-3 PO) Take 1 capsule by mouth in the morning.    [provider]  pantoprazole (PROTONIX) 40 MG tablet Take 40 mg by mouth daily before breakfast. 09/27/15   [provider]  propranolol (INDERAL) 20 MG tablet Take 1 tablet in AM, 2 tablets in PM 04/02/22   Van Clines, MD  propranolol (INDERAL) 20 MG tablet Take 1 tablet (20 mg total) by mouth 2 (two) times daily. 05/13/22   Vanetta Mulders, MD  PYRIDOXINE HCL PO Take 200 mg by mouth in the morning.    [provider]  Red Yeast Rice Extract (RED YEAST RICE PO) Take 1 tablet by mouth daily.  [provider]  SPIRIVA RESPIMAT 1.25 MCG/ACT AERS Inhale 2 puffs into the lungs in the morning. 05/13/21   [provider]      Allergies    Shellfish allergy, Zocor [simvastatin], Lasix [furosemide], Latex, and Penicillins    Review of Systems   Review of Systems  Physical Exam Updated Vital Signs BP (!) 187/95   Pulse 64   Temp 98.7 F (37.1 C) (Oral)   Resp 17   Ht  (1.676 m)   Wt 133.8 kg   SpO2 95%   BMI 47.61 kg/m  Physical Exam Vitals and nursing note reviewed.  Constitutional:      General: She is not in acute distress.    Appearance: She is well-developed. She is not diaphoretic.  HENT:     Head:  Normocephalic and atraumatic.  Eyes:     Conjunctiva/sclera: Conjunctivae normal.  Cardiovascular:     Rate and Rhythm: Normal rate and regular rhythm.     Heart sounds: Normal heart sounds. No murmur heard.    No friction rub. No gallop.  Pulmonary:     Effort: Pulmonary effort is normal. Tachypnea present. No respiratory distress.     Breath sounds: Wheezing present. No rales.  Abdominal:     General: There is no distension.     Palpations: Abdomen is soft.     Tenderness: There is no abdominal tenderness. There is no guarding.  Musculoskeletal:        General: No tenderness.     Cervical back: Normal range of motion.  Skin:    General: Skin is warm and dry.     Findings: No erythema or rash.  Neurological:     Mental Status: She is alert and oriented to person, place, and time.     ED Results / Procedures / Treatments   Labs (all labs ordered are listed, but only abnormal results are displayed) Labs Reviewed  CBC WITH DIFFERENTIAL/PLATELET - Abnormal; Notable for the following components:      Result Value   Hemoglobin 11.6 (*)    All other components within normal limits  COMPREHENSIVE METABOLIC PANEL - Abnormal; Notable for the following components:   Glucose, Bld 115 (*)    Calcium 8.6 (*)    Total Protein 8.2 (*)    Albumin 3.3 (*)    Total Bilirubin 0.2 (*)    All other components within normal limits  RESP PANEL BY RT-PCR (RSV, FLU A&B, COVID)  RVPGX2  BRAIN NATRIURETIC PEPTIDE  TROPONIN I (HIGH SENSITIVITY)  TROPONIN I (HIGH SENSITIVITY)    EKG EKG Interpretation  Date/Time:  Tuesday July 22 2022 20:29:12 EDT Ventricular Rate:  79 PR Interval:  212 QRS Duration: 84 QT Interval:  398 QTC Calculation: 457 R Axis:   24 Text Interpretation: Sinus rhythm Borderline prolonged PR interval Low voltage, precordial leads No significant change since last tracing Confirmed by Alvira Monday ([id number]) on 07/22/2022 11:49:27 PM  Radiology DG Chest Portable 1  View  Result Date: 07/22/2022 CLINICAL DATA:  Shortness of breath and wheezing for 1 week. EXAM: PORTABLE CHEST 1 VIEW COMPARISON:  05/13/2022 FINDINGS: Cardiac pacemaker. Shallow inspiration. Heart size and pulmonary vascularity are normal for technique. Lungs are clear. No pleural effusions. No pneumothorax. Mediastinal contours appear intact. IMPRESSION: No active disease. Electronically Signed   By: Burman Nieves M.D.   On: 07/22/2022 21:12    Procedures Procedures    Medications Ordered in ED Medications  methylPREDNISolone sodium succinate (SOLU-MEDROL) 125 mg/2  mL injection 125 mg (125 mg Intravenous Given 07/22/22 2033)  albuterol (PROVENTIL,VENTOLIN) solution continuous neb (10 mg/hr Nebulization New Bag/Given 07/22/22 2104)  iohexol (OMNIPAQUE) 350 MG/ML injection 75 mL (has no administration in time range)  albuterol (PROVENTIL) (2.5 MG/3ML) 0.083% nebulizer solution 5 mg (5 mg Nebulization Given 07/22/22 2031)  ipratropium-albuterol (DUONEB) 0.5-2.5 (3) MG/3ML nebulizer solution 3 mL (3 mLs Nebulization Given 07/22/22 2031)  ipratropium (ATROVENT) nebulizer solution 0.5 mg (0.5 mg Nebulization Given 07/22/22 2103)  magnesium sulfate IVPB 2 g 50 mL (0 g Intravenous Stopped 07/22/22 2151)  azithromycin (ZITHROMAX) 500 mg in sodium chloride 0.9 % 250 mL IVPB (0 mg Intravenous Stopped 07/22/22 2158)    ED Course/ Medical Decision Making/ A&P       76yo female with history of hypertension, GERD, asthma, COPD, asthma, CVA who presents with concern for shortness of breath.  Differential diagnosis for dyspnea includes ACS, PE, COPD exacerbation, CHF exacerbation, anemia, pneumonia, viral etiology such as COVID 19 infection, metabolic abnormality.    Chest x-ray was done and evaluated by me which showed no acute abnormalities.   EKG was evaluated by me which showed no significant changes.  BNP was 68. Troponin negative, doubt ACS.  Suspect COPD exacerbation. Given solumedrol,  azithromycin, Mg, continuous albuterol and atrovent.  Feels significantly improved while on continuous. Does now have desaturation. Reports LLE was swollen prior to right---will obtain CT PE study to evaluate for PE/occult pneumonia and LLE DVT study.  Admitted to stepdown for continued care of COPD exacerbation with PE/DVT study pending.            Final Clinical Impression(s) / ED Diagnoses Final diagnoses:  Exacerbation of persistent asthma, unspecified asthma severity  Hypoxia  Acute cough    Rx / DC Orders ED Discharge Orders     None         Alvira Monday, MD 07/22/22 2359

## 2022-07-22 NOTE — ED Triage Notes (Signed)
Pt states SOB/wheezing intermittent x 1 week, started getting worse this evening Has used inhaler and neb treatments today  Denies any pain  H/o asthma and COPD   Boneta Lucks, RT at bedside

## 2022-07-22 NOTE — ED Notes (Signed)
ED Provider at bedside. 

## 2022-07-22 NOTE — Progress Notes (Signed)
Plan of Care Note for accepted transfer   Patient: Autumn Young MRN: [id number]   DOA: 07/22/2022  Facility requesting transfer: [facility] ED Requesting Provider: Dr. Dalene Seltzer, EDP Reason for transfer: Asthma/COPD exacerbation and acute hypoxic respiratory failure.  Facility course: The patient is a 77 year old female with past medical history significant for asthma, COPD, prior CVA, pulmonary hypertension, hyperlipidemia, GERD, severe morbid obesity with BMI of 47, who presented to [facility] ED with complaints of shortness of breath, audible wheezing and cough.  The symptoms have been progressive for the past 2 weeks.  In the ED, noted to be hypoxic with O2 saturation in the 80s.  No reported chest pain no subjective fevers.  Lab studies notable for negative high-sensitivity troponin, negative BNP, negative leukocytosis.  Due to new respiratory failure with hypoxia, EDP ordered CTA chest to rule out pulmonary embolism which is pending.  The patient received continuous albuterol nebs, IV Solu-Medrol, IV magnesium, and a dose of 500 mg IV azithromycin.  Chest x-ray was nonacute.  Admitted to [facility] progressive care unit as inpatient status.  Please follow CT angio chest results to rule out acute pulmonary embolism.  Plan of care: The patient is accepted for admission to Progressive unit, at [facility].  Author: Darlin Drop, DO 07/22/2022  Check [email or web address] for on-call coverage.  Nursing staff, Please call [facility] Admits & Consults System-Wide number on Amion as soon as patient's arrival, so appropriate admitting provider can evaluate the pt.

## 2022-07-23 ENCOUNTER — Inpatient Hospital Stay (HOSPITAL_COMMUNITY): Payer: Medicare HMO

## 2022-07-23 DIAGNOSIS — J45901 Unspecified asthma with (acute) exacerbation: Secondary | ICD-10-CM | POA: Diagnosis not present

## 2022-07-23 DIAGNOSIS — Z6841 Body Mass Index (BMI) 40.0 and over, adult: Secondary | ICD-10-CM | POA: Diagnosis not present

## 2022-07-23 DIAGNOSIS — J9601 Acute respiratory failure with hypoxia: Secondary | ICD-10-CM

## 2022-07-23 DIAGNOSIS — I272 Pulmonary hypertension, unspecified: Secondary | ICD-10-CM | POA: Diagnosis present

## 2022-07-23 DIAGNOSIS — Z79899 Other long term (current) drug therapy: Secondary | ICD-10-CM | POA: Diagnosis not present

## 2022-07-23 DIAGNOSIS — Z888 Allergy status to other drugs, medicaments and biological substances status: Secondary | ICD-10-CM | POA: Diagnosis not present

## 2022-07-23 DIAGNOSIS — J441 Chronic obstructive pulmonary disease with (acute) exacerbation: Secondary | ICD-10-CM | POA: Diagnosis present

## 2022-07-23 DIAGNOSIS — K219 Gastro-esophageal reflux disease without esophagitis: Secondary | ICD-10-CM | POA: Diagnosis present

## 2022-07-23 DIAGNOSIS — R0602 Shortness of breath: Secondary | ICD-10-CM | POA: Diagnosis present

## 2022-07-23 DIAGNOSIS — E782 Mixed hyperlipidemia: Secondary | ICD-10-CM | POA: Diagnosis present

## 2022-07-23 DIAGNOSIS — Z91013 Allergy to seafood: Secondary | ICD-10-CM | POA: Diagnosis not present

## 2022-07-23 DIAGNOSIS — G4733 Obstructive sleep apnea (adult) (pediatric): Secondary | ICD-10-CM | POA: Diagnosis present

## 2022-07-23 DIAGNOSIS — Z7951 Long term (current) use of inhaled steroids: Secondary | ICD-10-CM | POA: Diagnosis not present

## 2022-07-23 DIAGNOSIS — I10 Essential (primary) hypertension: Secondary | ICD-10-CM | POA: Diagnosis present

## 2022-07-23 DIAGNOSIS — Z833 Family history of diabetes mellitus: Secondary | ICD-10-CM | POA: Diagnosis not present

## 2022-07-23 DIAGNOSIS — Z91040 Latex allergy status: Secondary | ICD-10-CM | POA: Diagnosis not present

## 2022-07-23 DIAGNOSIS — Z8249 Family history of ischemic heart disease and other diseases of the circulatory system: Secondary | ICD-10-CM | POA: Diagnosis not present

## 2022-07-23 DIAGNOSIS — Z880 Allergy status to penicillin: Secondary | ICD-10-CM | POA: Diagnosis not present

## 2022-07-23 DIAGNOSIS — Z8673 Personal history of transient ischemic attack (TIA), and cerebral infarction without residual deficits: Secondary | ICD-10-CM | POA: Diagnosis not present

## 2022-07-23 DIAGNOSIS — Z1152 Encounter for screening for COVID-19: Secondary | ICD-10-CM | POA: Diagnosis not present

## 2022-07-23 DIAGNOSIS — Z7902 Long term (current) use of antithrombotics/antiplatelets: Secondary | ICD-10-CM | POA: Diagnosis not present

## 2022-07-23 LAB — RESPIRATORY PANEL BY PCR

## 2022-07-23 LAB — TROPONIN I (HIGH SENSITIVITY): Troponin I (High Sensitivity): 5 ng/L (ref <18)

## 2022-07-23 MED ORDER — NITROGLYCERIN 0.4 MG SL SUBL
0.4000 mg | SUBLINGUAL_TABLET | SUBLINGUAL | Status: AC | PRN
  Administered 2022-07-23 (×3): 0.4 mg via SUBLINGUAL

## 2022-07-23 MED ORDER — SODIUM CHLORIDE 0.9% FLUSH
3.0000 mL | Freq: Two times a day (BID) | INTRAVENOUS | Status: DC
  Administered 2022-07-23 – 2022-07-26 (×8): 3 mL via INTRAVENOUS

## 2022-07-23 MED ORDER — AZITHROMYCIN 250 MG PO TABS
500.0000 mg | ORAL_TABLET | ORAL | Status: DC
  Administered 2022-07-23 – 2022-07-25 (×3): 500 mg via ORAL
  Filled 2022-07-23 (×3): qty 2

## 2022-07-23 MED ORDER — ONDANSETRON HCL 4 MG/2ML IJ SOLN
4.0000 mg | Freq: Four times a day (QID) | INTRAMUSCULAR | Status: DC | PRN
  Administered 2022-07-23: 4 mg via INTRAVENOUS
  Filled 2022-07-23: qty 2

## 2022-07-23 MED ORDER — ACETAMINOPHEN 650 MG RE SUPP: 650.0000 mg | Freq: Four times a day (QID) | RECTAL | Status: DC | PRN

## 2022-07-23 MED ORDER — ENOXAPARIN SODIUM 80 MG/0.8ML IJ SOSY
65.0000 mg | PREFILLED_SYRINGE | Freq: Every day | INTRAMUSCULAR | Status: DC
  Administered 2022-07-23: 65 mg via SUBCUTANEOUS
  Filled 2022-07-23: qty 0.8

## 2022-07-23 MED ORDER — CLOPIDOGREL BISULFATE 75 MG PO TABS
75.0000 mg | ORAL_TABLET | Freq: Every morning | ORAL | Status: DC
  Administered 2022-07-23 – 2022-07-26 (×4): 75 mg via ORAL
  Filled 2022-07-23 (×4): qty 1

## 2022-07-23 MED ORDER — MORPHINE SULFATE (PF) 2 MG/ML IV SOLN
1.0000 mg | Freq: Once | INTRAVENOUS | Status: AC
  Administered 2022-07-23: 1 mg via INTRAVENOUS
  Filled 2022-07-23: qty 1

## 2022-07-23 MED ORDER — NITROGLYCERIN 0.4 MG SL SUBL
SUBLINGUAL_TABLET | SUBLINGUAL | Status: AC
  Filled 2022-07-23: qty 1

## 2022-07-23 MED ORDER — BUDESONIDE 0.25 MG/2ML IN SUSP
0.2500 mg | Freq: Two times a day (BID) | RESPIRATORY_TRACT | Status: DC
  Administered 2022-07-23 – 2022-07-26 (×6): 0.25 mg via RESPIRATORY_TRACT
  Filled 2022-07-23 (×7): qty 2

## 2022-07-23 MED ORDER — PROPRANOLOL HCL 20 MG PO TABS
20.0000 mg | ORAL_TABLET | Freq: Two times a day (BID) | ORAL | Status: DC
  Administered 2022-07-23 – 2022-07-26 (×7): 20 mg via ORAL
  Filled 2022-07-23 (×7): qty 1

## 2022-07-23 MED ORDER — ACETAMINOPHEN 325 MG PO TABS: 650.0000 mg | ORAL_TABLET | Freq: Four times a day (QID) | ORAL | Status: DC | PRN

## 2022-07-23 MED ORDER — IRBESARTAN 150 MG PO TABS
150.0000 mg | ORAL_TABLET | Freq: Every day | ORAL | Status: DC
  Administered 2022-07-23 – 2022-07-26 (×4): 150 mg via ORAL
  Filled 2022-07-23 (×4): qty 1

## 2022-07-23 MED ORDER — SODIUM CHLORIDE (PF) 0.9 % IJ SOLN
INTRAMUSCULAR | Status: AC
  Filled 2022-07-23: qty 50

## 2022-07-23 MED ORDER — ARFORMOTEROL TARTRATE 15 MCG/2ML IN NEBU
15.0000 ug | INHALATION_SOLUTION | Freq: Two times a day (BID) | RESPIRATORY_TRACT | Status: DC
  Administered 2022-07-23 – 2022-07-26 (×6): 15 ug via RESPIRATORY_TRACT
  Filled 2022-07-23 (×7): qty 2

## 2022-07-23 MED ORDER — HYDRALAZINE HCL 20 MG/ML IJ SOLN
10.0000 mg | Freq: Four times a day (QID) | INTRAMUSCULAR | Status: DC | PRN
  Administered 2022-07-23 – 2022-07-26 (×2): 10 mg via INTRAVENOUS
  Filled 2022-07-23 (×2): qty 1

## 2022-07-23 MED ORDER — PROCHLORPERAZINE EDISYLATE 10 MG/2ML IJ SOLN
5.0000 mg | Freq: Once | INTRAMUSCULAR | Status: AC
  Administered 2022-07-23: 5 mg via INTRAVENOUS
  Filled 2022-07-23: qty 2

## 2022-07-23 MED ORDER — MONTELUKAST SODIUM 10 MG PO TABS
10.0000 mg | ORAL_TABLET | Freq: Every day | ORAL | Status: DC
  Administered 2022-07-23 – 2022-07-25 (×3): 10 mg via ORAL
  Filled 2022-07-23 (×3): qty 1

## 2022-07-23 MED ORDER — ORAL CARE MOUTH RINSE: 15.0000 mL | OROMUCOSAL | Status: DC | PRN

## 2022-07-23 MED ORDER — PANTOPRAZOLE SODIUM 40 MG PO TBEC
40.0000 mg | DELAYED_RELEASE_TABLET | Freq: Every day | ORAL | Status: DC
  Administered 2022-07-23 – 2022-07-26 (×4): 40 mg via ORAL
  Filled 2022-07-23 (×4): qty 1

## 2022-07-23 MED ORDER — IPRATROPIUM-ALBUTEROL 0.5-2.5 (3) MG/3ML IN SOLN
3.0000 mL | Freq: Four times a day (QID) | RESPIRATORY_TRACT | Status: DC
  Administered 2022-07-23 – 2022-07-26 (×13): 3 mL via RESPIRATORY_TRACT
  Filled 2022-07-23 (×14): qty 3

## 2022-07-23 MED ORDER — IPRATROPIUM-ALBUTEROL 0.5-2.5 (3) MG/3ML IN SOLN: 3.0000 mL | Freq: Four times a day (QID) | RESPIRATORY_TRACT | Status: DC

## 2022-07-23 MED ORDER — IPRATROPIUM-ALBUTEROL 0.5-2.5 (3) MG/3ML IN SOLN
3.0000 mL | Freq: Four times a day (QID) | RESPIRATORY_TRACT | Status: DC | PRN
  Administered 2022-07-24 – 2022-07-25 (×3): 3 mL via RESPIRATORY_TRACT
  Filled 2022-07-23 (×3): qty 3

## 2022-07-23 NOTE — H&P (Signed)
History and Physical    KARIMAH WINQUIST  RUE:[id number]  DOB: 17-Oct-1945  DOA: 07/22/2022  PCP: Patient, No Pcp Per Patient coming from: Home  Chief Complaint: SOB, cough, wheezing  HPI:  Ms. Latif is a 77 yo female with PMH asthma, COPD, GERD, HTN, CVA who presented to [facility] with approximately 2-week complaint of shortness of breath, ongoing coughing and wheezing.  She has had minimal relief from home breathing treatments.  She was also found to be hypoxic with oxygen in the 80s on workup. She had wheezing on exam with concerns for asthma exacerbation.  CXR was unremarkable.  She also had reported that her left leg had began swelling and left lower extremity duplex was obtained which was negative for DVT. She was transferred for ongoing asthma exacerbation treatment and further workup as necessary.  I have personally briefly reviewed patient's old medical records in [facility] and discussed patient with the ER provider when appropriate/indicated.  Assessment and Plan: * Asthma exacerbation - Wheezing, shortness of breath, dyspnea with exertion, nonproductive cough -Negative flu, COVID, RSV - Check RVP - Continue steroids, breathing treatments, and azithromycin  Acute respiratory failure with hypoxia - Not on home oxygen - Wean as able - follow up CTA chest  History of CVA (cerebrovascular accident) - continue plavix  OSA (obstructive sleep apnea) - Patient endorses not using CPAP routinely at home -Will offer nightly CPAP while hospitalized  Mixed hyperlipidemia - Hold omega and red yeast rice for now, can resume at discharge  Essential hypertension Continue ARB and propranolol   Code Status:     Code Status: Full Code  DVT Prophylaxis:   enoxaparin (LOVENOX) injection 40 mg Start: 07/23/22 1000   Anticipated disposition is to: Home  History: Past Medical History:  Diagnosis Date   Asthma    COPD (chronic obstructive pulmonary disease)    GERD  (gastroesophageal reflux disease)    Hypertension    Stroke     Past Surgical History:  Procedure Laterality Date   PACEMAKER IMPLANT N/A 09/23/2021   Procedure: PACEMAKER IMPLANT;  Surgeon: Lanier Prude, MD;  Location: [facility];  Service: Cardiovascular;  Laterality: N/A;   TUBAL LIGATION       reports that she has never smoked. She has never used smokeless tobacco. She reports that she does not drink alcohol and does not use drugs.  Allergies  Allergen Reactions   Shellfish Allergy Anaphylaxis   Zocor [Simvastatin] Swelling   Lasix [Furosemide] Swelling   Latex     rash   Penicillins Rash    Has patient had a PCN reaction causing immediate rash, facial/tongue/throat swelling, SOB or lightheadedness with hypotension: Unknown Has patient had a PCN reaction causing severe rash involving mucus membranes or skin necrosis: No Has patient had a PCN reaction that required hospitalization: No Has patient had a PCN reaction occurring within the last 10 years: No If all of the above answers are "NO", then may proceed with Cephalosporin use.     Family History  Problem Relation Age of Onset   Cancer Mother        Lung, cervix, mouth   Hypertension Father    Cancer Sister        Colon   Diabetes Sister    Hypertension Sister    Heart disease Sister    Cancer Brother    Hypertension Brother    Home Medications: Prior to Admission medications   Medication Sig Start Date End Date  Taking? Authorizing Provider  acetaminophen (TYLENOL) 500 MG tablet Take 500-1,000 mg by mouth every 6 (six) hours as needed (pain).    [provider]  albuterol (PROVENTIL HFA) 108 (90 Base) MCG/ACT inhaler Inhale 2 puffs into the lungs every 6 (six) hours as needed for wheezing or shortness of breath. 08/13/17   Hongalgi, Maximino Greenland, MD  ascorbic acid (VITAMIN C) 1000 MG tablet Take 1,000 mg by mouth daily. 05/14/18   [provider]  BREO ELLIPTA 200-25 MCG/ACT AEPB Inhale 1  puff into the lungs in the morning. 05/18/21   [provider]  Cholecalciferol 25 MCG (1000 UT) tablet Take 1,000 Units by mouth in the morning.    [provider]  clopidogrel (PLAVIX) 75 MG tablet Take 1 tablet (75 mg total) by mouth in the morning. 02/24/22   Lanier Prude, MD  clopidogrel (PLAVIX) 75 MG tablet Take 1 tablet (75 mg total) by mouth daily. 05/13/22   Vanetta Mulders, MD  Cyanocobalamin (VITAMIN B-12 PO) Take 4 drops by mouth in the morning.    [provider]  montelukast (SINGULAIR) 10 MG tablet Take 10 mg by mouth at bedtime. 03/26/21   [provider]  olmesartan (BENICAR) 20 MG tablet Take 20 mg by mouth at bedtime. 04/21/21   [provider]  Omega-3 Fatty Acids (OMEGA-3 PO) Take 1 capsule by mouth in the morning.    [provider]  pantoprazole (PROTONIX) 40 MG tablet Take 40 mg by mouth daily before breakfast. 09/27/15   [provider]  propranolol (INDERAL) 20 MG tablet Take 1 tablet in AM, 2 tablets in PM 04/02/22   Van Clines, MD  propranolol (INDERAL) 20 MG tablet Take 1 tablet (20 mg total) by mouth 2 (two) times daily. 05/13/22   Vanetta Mulders, MD  PYRIDOXINE HCL PO Take 200 mg by mouth in the morning.    [provider]  Red Yeast Rice Extract (RED YEAST RICE PO) Take 1 tablet by mouth daily.    [provider]  SPIRIVA RESPIMAT 1.25 MCG/ACT AERS Inhale 2 puffs into the lungs in the morning. 05/13/21   [provider]    Review of Systems:  Review of Systems  Constitutional: Negative.   HENT: Negative.    Eyes: Negative.   Respiratory:  Positive for cough, shortness of breath and wheezing.   Cardiovascular: Negative.   Gastrointestinal: Negative.   Genitourinary: Negative.   Musculoskeletal: Negative.   Skin: Negative.   Neurological: Negative.   Endo/Heme/Allergies: Negative.   Psychiatric/Behavioral: Negative.      Physical Exam:  Vitals:   07/22/22  2359 07/23/22 0000 07/23/22 0006 07/23/22 0202  BP:  (!) 174/98  (!) 189/85  Pulse:  66 66 83  Resp:  (!) 21 (!) 21 20  Temp: 98.5 F (36.9 C)   97.8 F (36.6 C)  TempSrc: Oral   Oral  SpO2:  97% 97% 95%  Weight:      Height:       Physical Exam Constitutional:      Appearance: Normal appearance.  HENT:     Head: Normocephalic and atraumatic.     Mouth/Throat:     Mouth: Mucous membranes are moist.  Eyes:     Extraocular Movements: Extraocular movements intact.  Cardiovascular:     Rate and Rhythm: Normal rate and regular rhythm.  Pulmonary:     Effort: Pulmonary effort is normal. No respiratory distress.     Breath sounds: Wheezing and  rhonchi present.  Abdominal:     General: Bowel sounds are normal. There is no distension.     Palpations: Abdomen is soft.     Tenderness: There is no abdominal tenderness.  Musculoskeletal:        General: Normal range of motion.     Cervical back: Normal range of motion and neck supple.  Skin:    General: Skin is warm and dry.  Neurological:     General: No focal deficit present.     Mental Status: She is alert.  Psychiatric:        Mood and Affect: Mood normal.      Labs on Admission:  I have personally reviewed following labs and imaging studies Results for orders placed or performed during the hospital encounter of 07/22/22 (from the past 24 hour(s))  CBC with Differential     Status: Abnormal   Collection Time: 07/22/22  8:31 PM  Result Value Ref Range   WBC 6.4 4.0 - 10.5 K/uL   RBC 4.24 3.87 - 5.11 MIL/uL   Hemoglobin 11.6 (L) 12.0 - 15.0 g/dL   HCT 16.1 09.6 - 04.5 %   MCV 87.7 80.0 - 100.0 fL   MCH 27.4 26.0 - 34.0 pg   MCHC 31.2 30.0 - 36.0 g/dL   RDW 40.9 81.1 - 91.4 %   Platelets 295 150 - 400 K/uL   nRBC 0.0 0.0 - 0.2 %   Neutrophils Relative % 53 %   Neutro Abs 3.4 1.7 - 7.7 K/uL   Lymphocytes Relative 30 %   Lymphs Abs 1.9 0.7 - 4.0 K/uL   Monocytes Relative 11 %   Monocytes Absolute 0.7 0.1 - 1.0 K/uL    Eosinophils Relative 5 %   Eosinophils Absolute 0.3 0.0 - 0.5 K/uL   Basophils Relative 1 %   Basophils Absolute 0.1 0.0 - 0.1 K/uL   Immature Granulocytes 0 %   Abs Immature Granulocytes 0.02 0.00 - 0.07 K/uL  Comprehensive metabolic panel     Status: Abnormal   Collection Time: 07/22/22  8:31 PM  Result Value Ref Range   Sodium 137 135 - 145 mmol/L   Potassium 3.7 3.5 - 5.1 mmol/L   Chloride 102 98 - 111 mmol/L   CO2 28 22 - 32 mmol/L   Glucose, Bld 115 (H) 70 - 99 mg/dL   BUN 15 8 - 23 mg/dL   Creatinine, Ser 7.82 0.44 - 1.00 mg/dL   Calcium 8.6 (L) 8.9 - 10.3 mg/dL   Total Protein 8.2 (H) 6.5 - 8.1 g/dL   Albumin 3.3 (L) 3.5 - 5.0 g/dL   AST 34 15 - 41 U/L   ALT 21 0 - 44 U/L   Alkaline Phosphatase 82 38 - 126 U/L   Total Bilirubin 0.2 (L) 0.3 - 1.2 mg/dL   GFR, Estimated >95 >62 mL/min   Anion gap 7 5 - 15  Troponin I (High Sensitivity)     Status: None   Collection Time: 07/22/22  8:32 PM  Result Value Ref Range   Troponin I (High Sensitivity) 6 <18 ng/L  Brain natriuretic peptide     Status: None   Collection Time: 07/22/22  8:32 PM  Result Value Ref Range   B Natriuretic Peptide 68.0 0.0 - 100.0 pg/mL  Resp panel by RT-PCR (RSV, Flu A&B, Covid) Anterior Nasal Swab     Status: None   Collection Time: 07/22/22  8:41 PM   Specimen: Anterior Nasal Swab  Result Value  Ref Range   SARS Coronavirus 2 by RT PCR NEGATIVE NEGATIVE   Influenza A by PCR NEGATIVE NEGATIVE   Influenza B by PCR NEGATIVE NEGATIVE   Resp Syncytial Virus by PCR NEGATIVE NEGATIVE  Troponin I (High Sensitivity)     Status: None   Collection Time: 07/22/22 10:47 PM  Result Value Ref Range   Troponin I (High Sensitivity) 3 <18 ng/L     Radiological Exams on Admission: US Venous Img Lower Unilateral Left  Result Date: 07/23/2022 CLINICAL DATA:  Left ankle swelling with shortness of breath and wheezing. EXAM: LEFT LOWER EXTREMITY VENOUS DOPPLER ULTRASOUND TECHNIQUE: Gray-scale sonography with  compression, as well as color and duplex ultrasound, were performed to evaluate the deep venous system(s) from the level of the common femoral vein through the popliteal and proximal calf veins. COMPARISON:  None Available. FINDINGS: VENOUS Normal compressibility of the common femoral, superficial femoral, and popliteal veins, as well as the visualized calf veins. Visualized portions of profunda femoral vein and great saphenous vein unremarkable. No filling defects to suggest DVT on grayscale or color Doppler imaging. Doppler waveforms show normal direction of venous flow, normal respiratory plasticity and response to augmentation. Limited views of the contralateral common femoral vein are unremarkable. OTHER None. Limitations: Examination limited due to patient's body habitus. IMPRESSION: Negative. Electronically Signed   By: Thornell Sartorius M.D.   On: 07/23/2022 00:13   DG Chest Portable 1 View  Result Date: 07/22/2022 CLINICAL DATA:  Shortness of breath and wheezing for 1 week. EXAM: PORTABLE CHEST 1 VIEW COMPARISON:  05/13/2022 FINDINGS: Cardiac pacemaker. Shallow inspiration. Heart size and pulmonary vascularity are normal for technique. Lungs are clear. No pleural effusions. No pneumothorax. Mediastinal contours appear intact. IMPRESSION: No active disease. Electronically Signed   By: Burman Nieves M.D.   On: 07/22/2022 21:12   US Venous Img Lower Unilateral Left  Final Result    DG Chest Portable 1 View  Final Result    CT Angio Chest PE W and/or Wo Contrast    (Results Pending)   EKG: Independently reviewed. NSR   Lewie Chamber, MD [facility] 07/23/2022, 2:46 AM

## 2022-07-23 NOTE — Assessment & Plan Note (Signed)
-  continue plavix 

## 2022-07-23 NOTE — Hospital Course (Signed)
Autumn Young is a 77 yo female with PMH asthma, COPD, GERD, HTN, CVA who presented to [facility] with approximately 2-week complaint of shortness of breath, ongoing coughing and wheezing.  She has had minimal relief from home breathing treatments.  She was also found to be hypoxic with oxygen in the 80s on workup. She had wheezing on exam with concerns for asthma exacerbation.  CXR was unremarkable.  She also had reported that her left leg had began swelling and left lower extremity duplex was obtained which was negative for DVT. She was transferred for ongoing asthma exacerbation treatment and further workup as necessary.

## 2022-07-23 NOTE — Assessment & Plan Note (Signed)
-   Patient endorses not using CPAP routinely at home -Will offer nightly CPAP while hospitalized

## 2022-07-23 NOTE — ED Notes (Signed)
[facility] called for transport. 

## 2022-07-23 NOTE — Assessment & Plan Note (Signed)
-   Wheezing, shortness of breath, dyspnea with exertion, nonproductive cough -Negative flu, COVID, RSV - Check RVP - Continue steroids, breathing treatments, and azithromycin

## 2022-07-23 NOTE — Progress Notes (Signed)
No charge note  Patient seen and examined this morning, admitted overnight, H&P reviewed and I agree with the assessment and plan.  77 year old female with COPD, GERD, HTN, prior CVA who comes in with couple of weeks of shortness of breath, cough, and wheezing.  She was felt to have asthma exacerbation, and was admitted to the hospital on steroids, antibiotics and nebulizers.  Improving this morning, continue current treatment as below  Scheduled Meds:  arformoterol  15 mcg Nebulization BID   azithromycin  500 mg Oral Q24H   budesonide (PULMICORT) nebulizer solution  0.25 mg Nebulization BID   clopidogrel  75 mg Oral q AM   enoxaparin (LOVENOX) injection  65 mg Subcutaneous Daily   ipratropium-albuterol  3 mL Nebulization QID   irbesartan  150 mg Oral Daily   methylPREDNISolone (SOLU-MEDROL) injection  125 mg Intravenous Daily   montelukast  10 mg Oral QHS   pantoprazole  40 mg Oral QAC breakfast   propranolol  20 mg Oral BID   sodium chloride flush  3 mL Intravenous Q12H   Continuous Infusions: PRN Meds:.acetaminophen **OR** acetaminophen, ipratropium-albuterol, mouth rinse  Saleha Kalp M. Elvera Lennox, MD, PhD [facility]  Between 7 am - 7 pm you can contact me via Amion (for emergencies) or Securechat (non urgent matters).  I am not available 7 pm - 7 am, please contact night coverage MD/APP via Amion

## 2022-07-23 NOTE — Assessment & Plan Note (Signed)
Continue ARB and propranolol

## 2022-07-23 NOTE — Progress Notes (Signed)
    Patient Name: TAKEYA MARQUIS           DOB: [birth year]  MRN: [id number]      Admission Date: 07/22/2022  Attending Provider: Leatha Gilding, MD  Primary Diagnosis: Asthma exacerbation   Level of care: Progressive    CROSS COVER NOTE   Date of Service   07/23/2022   [name], 77 y.o. female, was admitted on 07/22/2022 for Asthma exacerbation.    HPI/Events of Note   Patient hypertensive, 210/80s, has complaints of nausea and new onset pressure-like chest pain radiating to left arm and mid back.  Pain score, 9/10.    Patient recently received hydralazine for hypertension, and effective in improving BP.   Orders have been placed for EKG, troponin, chest x-ray, nitroglycerin SL, IV morphine. Patient denies chest palpitations, shortness of breath, abdominal discomfort.  She endorses nausea, vomiting.    Interventions/ Plan   EKG-NSR without ST segment changes Troponin-negative Chest x-ray-no signs of pulmonary edema or focal pulmonary consolidation. Nitro SL IV morphine IV Compazine        Anthoney Harada, DNP, [employer] [facility]

## 2022-07-23 NOTE — Assessment & Plan Note (Signed)
-   Hold omega and red yeast rice for now, can resume at discharge

## 2022-07-23 NOTE — TOC CM/SW Note (Signed)
  Transition of Care [facility]) Screening Note   Patient Details  Name: Autumn Young Date of Birth: March 11, 1946   Transition of Care [facility]) CM/SW Contact:    Howell Rucks, RN Phone Number: 07/23/2022, 8:35 AM    Transition of Care Department [facility]) has reviewed patient and no TOC needs have been identified at this time. We will continue to monitor patient advancement through interdisciplinary progression rounds. If new patient transition needs arise, please place a TOC consult.

## 2022-07-23 NOTE — Progress Notes (Signed)
At start of shift patient with c/o chest pain radiating to back and down left arm, EKG performed, NP notified. Patient remained alert and orientated, new orders received and carried out. Patient is resting comfortably in bed at time of entry, call bell is within patient reach, will continue to observe.

## 2022-07-23 NOTE — ED Notes (Signed)
ED TO INPATIENT HANDOFF REPORT  ED Nurse Name and Phone #: Dominga Ferry, NR Paramedic, [phone]  S Name/Age/Gender Autumn Young 77 y.o. female Room/Bed: MH01/MH01  Code Status   Code Status: Prior  Home/SNF/Other Home Patient oriented to: self, place, time, and situation Is this baseline? Yes   Triage Complete: Triage complete  Chief Complaint Asthma exacerbation [J45.901]  Triage Note Pt states SOB/wheezing intermittent x 1 week, started getting worse this evening Has used inhaler and neb treatments today  Denies any pain  H/o asthma and COPD   Boneta Lucks, RT at bedside    Allergies Allergies  Allergen Reactions   Shellfish Allergy Anaphylaxis   Zocor [Simvastatin] Swelling   Lasix [Furosemide] Swelling   Latex     rash   Penicillins Rash    Has patient had a PCN reaction causing immediate rash, facial/tongue/throat swelling, SOB or lightheadedness with hypotension: Unknown Has patient had a PCN reaction causing severe rash involving mucus membranes or skin necrosis: No Has patient had a PCN reaction that required hospitalization: No Has patient had a PCN reaction occurring within the last 10 years: No If all of the above answers are "NO", then may proceed with Cephalosporin use.     Level of Care/Admitting Diagnosis ED Disposition     ED Disposition  Admit   Condition  --   Comment  Hospital Area: [facility] [[id number]]  Level of Care: Progressive [102]  Admit to Progressive based on following criteria: RESPIRATORY PROBLEMS hypoxemic/hypercapnic respiratory failure that is responsive to NIPPV (BiPAP) or High Flow Nasal Cannula (6-80 lpm). Frequent assessment/intervention, no > Q2 hrs < Q4 hrs, to maintain oxygenation and pulmonary hygiene.  May admit patient to [facility] or [facility] if equivalent level of care is available:: Yes  Interfacility transfer: Yes  Covid Evaluation: Asymptomatic - no recent exposure (last 10 days) testing  not required  Diagnosis: Asthma exacerbation [098119]  Admitting Physician: Darlin Drop [[id number]]  Attending Physician: Darlin Drop [[id number]]  Certification:: I certify this patient will need inpatient services for at least 2 midnights  Estimated Length of Stay: 2          B Medical/Surgery History Past Medical History:  Diagnosis Date   Asthma    COPD (chronic obstructive pulmonary disease)    GERD (gastroesophageal reflux disease)    Hypertension    Stroke    Past Surgical History:  Procedure Laterality Date   PACEMAKER IMPLANT N/A 09/23/2021   Procedure: PACEMAKER IMPLANT;  Surgeon: Lanier Prude, MD;  Location: [facility]VASIVE [facility];  Service: Cardiovascular;  Laterality: N/A;   TUBAL LIGATION       A IV Location/Drains/Wounds Patient Lines/Drains/Airways Status     Active Line/Drains/Airways     Name Placement date Placement time Site Days   Peripheral IV 07/22/22 20 G Posterior;Right Hand 07/22/22  2056  Hand  1   Peripheral IV 07/22/22 20 G 1.88" Left Antecubital 07/22/22  2345  Antecubital  1            Intake/Output Last 24 hours  Intake/Output Summary (Last 24 hours) at 07/23/2022 0030 Last data filed at 07/22/2022 2202 Gross per 24 hour  Intake 297.87 ml  Output --  Net 297.87 ml    Labs/Imaging Results for orders placed or performed during the hospital encounter of 07/22/22 (from the past 48 hour(s))  CBC with Differential     Status: Abnormal   Collection Time: 07/22/22  8:31 PM  Result Value Ref Range   WBC 6.4 4.0 - 10.5 K/uL   RBC 4.24 3.87 - 5.11 MIL/uL   Hemoglobin 11.6 (L) 12.0 - 15.0 g/dL   HCT 69.6 29.5 - 28.4 %   MCV 87.7 80.0 - 100.0 fL   MCH 27.4 26.0 - 34.0 pg   MCHC 31.2 30.0 - 36.0 g/dL   RDW 13.2 44.0 - 10.2 %   Platelets 295 150 - 400 K/uL   nRBC 0.0 0.0 - 0.2 %   Neutrophils Relative % 53 %   Neutro Abs 3.4 1.7 - 7.7 K/uL   Lymphocytes Relative 30 %   Lymphs Abs 1.9 0.7 - 4.0 K/uL   Monocytes Relative 11 %    Monocytes Absolute 0.7 0.1 - 1.0 K/uL   Eosinophils Relative 5 %   Eosinophils Absolute 0.3 0.0 - 0.5 K/uL   Basophils Relative 1 %   Basophils Absolute 0.1 0.0 - 0.1 K/uL   Immature Granulocytes 0 %   Abs Immature Granulocytes 0.02 0.00 - 0.07 K/uL    Comment: Performed at [facility], [address] [place], [place] [address]  Comprehensive metabolic panel     Status: Abnormal   Collection Time: 07/22/22  8:31 PM  Result Value Ref Range   Sodium 137 135 - 145 mmol/L   Potassium 3.7 3.5 - 5.1 mmol/L   Chloride 102 98 - 111 mmol/L   CO2 28 22 - 32 mmol/L   Glucose, Bld 115 (H) 70 - 99 mg/dL    Comment: Glucose reference range applies only to samples taken after fasting for at least 8 hours.   BUN 15 8 - 23 mg/dL   Creatinine, Ser 6.44 0.44 - 1.00 mg/dL   Calcium 8.6 (L) 8.9 - 10.3 mg/dL   Total Protein 8.2 (H) 6.5 - 8.1 g/dL   Albumin 3.3 (L) 3.5 - 5.0 g/dL   AST 34 15 - 41 U/L   ALT 21 0 - 44 U/L   Alkaline Phosphatase 82 38 - 126 U/L   Total Bilirubin 0.2 (L) 0.3 - 1.2 mg/dL   GFR, Estimated >03 >47 mL/min    Comment: (NOTE) Calculated using the CKD-EPI Creatinine Equation (2021)    Anion gap 7 5 - 15    Comment: Performed at [facility], [address] [place], [place] [address]  Troponin I (High Sensitivity)     Status: None   Collection Time: 07/22/22  8:32 PM  Result Value Ref Range   Troponin I (High Sensitivity) 6 <18 ng/L    Comment: (NOTE) Elevated high sensitivity troponin I (hsTnI) values and significant  changes across serial measurements may suggest ACS but many other  chronic and acute conditions are known to elevate hsTnI results.  Refer to the "Links" section for chest pain algorithms and additional  guidance. Performed at [facility], [address] [place], [place] [address]   Brain natriuretic peptide     Status: None   Collection Time: 07/22/22  8:32 PM  Result Value Ref Range   B Natriuretic Peptide 68.0 0.0  - 100.0 pg/mL    Comment: Performed at [facility], [address] [place], [place] [address]  Resp panel by RT-PCR (RSV, Flu A&B, Covid) Anterior Nasal Swab     Status: None   Collection Time: 07/22/22  8:41 PM   Specimen: Anterior Nasal Swab  Result Value Ref Range   SARS Coronavirus 2 by RT  PCR NEGATIVE NEGATIVE    Comment: (NOTE) SARS-CoV-2 target nucleic acids are NOT DETECTED.  The SARS-CoV-2 RNA is generally detectable in upper respiratory specimens during the acute phase of infection. The lowest concentration of SARS-CoV-2 viral copies this assay can detect is 138 copies/mL. A negative result does not preclude SARS-Cov-2 infection and should not be used as the sole basis for treatment or other patient management decisions. A negative result may occur with  improper specimen collection/handling, submission of specimen other than nasopharyngeal swab, presence of viral mutation(s) within the areas targeted by this assay, and inadequate number of viral copies(<138 copies/mL). A negative result must be combined with clinical observations, patient history, and epidemiological information. The expected result is Negative.  Fact Sheet for Patients:  BloggerCourse.com  Fact Sheet for Healthcare Providers:  SeriousBroker.it  This test is no t yet approved or cleared by the [place] FDA and  has been authorized for detection and/or diagnosis of SARS-CoV-2 by FDA under an Emergency Use Authorization (EUA). This EUA will remain  in effect (meaning this test can be used) for the duration of the COVID-19 declaration under Section 564(b)(1) of the Act, 21 U.S.C.section 360bbb-3(b)(1), unless the authorization is terminated  or revoked sooner.       Influenza A by PCR NEGATIVE NEGATIVE   Influenza B by PCR NEGATIVE NEGATIVE    Comment: (NOTE) The Xpert Xpress SARS-CoV-2/FLU/RSV plus assay is intended as an aid in the  diagnosis of influenza from Nasopharyngeal swab specimens and should not be used as a sole basis for treatment. Nasal washings and aspirates are unacceptable for Xpert Xpress SARS-CoV-2/FLU/RSV testing.  Fact Sheet for Patients: BloggerCourse.com  Fact Sheet for Healthcare Providers: SeriousBroker.it  This test is not yet approved or cleared by the [place] FDA and has been authorized for detection and/or diagnosis of SARS-CoV-2 by FDA under an Emergency Use Authorization (EUA). This EUA will remain in effect (meaning this test can be used) for the duration of the COVID-19 declaration under Section 564(b)(1) of the Act, 21 U.S.C. section 360bbb-3(b)(1), unless the authorization is terminated or revoked.     Resp Syncytial Virus by PCR NEGATIVE NEGATIVE    Comment: (NOTE) Fact Sheet for Patients: BloggerCourse.com  Fact Sheet for Healthcare Providers: SeriousBroker.it  This test is not yet approved or cleared by the [place] FDA and has been authorized for detection and/or diagnosis of SARS-CoV-2 by FDA under an Emergency Use Authorization (EUA). This EUA will remain in effect (meaning this test can be used) for the duration of the COVID-19 declaration under Section 564(b)(1) of the Act, 21 U.S.C. section 360bbb-3(b)(1), unless the authorization is terminated or revoked.  Performed at [facility], [address] [place], [place] [address]   Troponin I (High Sensitivity)     Status: None   Collection Time: 07/22/22 10:47 PM  Result Value Ref Range   Troponin I (High Sensitivity) 3 <18 ng/L    Comment: (NOTE) Elevated high sensitivity troponin I (hsTnI) values and significant  changes across serial measurements may suggest ACS but many other  chronic and acute conditions are known to elevate hsTnI results.  Refer to the "Links" section for chest pain  algorithms and additional  guidance. Performed at [facility], [address] [place], [place] [address]    US Venous Img Lower Unilateral Left  Result Date: 07/23/2022 CLINICAL DATA:  Left ankle swelling with shortness of breath and wheezing. EXAM: LEFT LOWER EXTREMITY VENOUS DOPPLER  ULTRASOUND TECHNIQUE: Gray-scale sonography with compression, as well as color and duplex ultrasound, were performed to evaluate the deep venous system(s) from the level of the common femoral vein through the popliteal and proximal calf veins. COMPARISON:  None Available. FINDINGS: VENOUS Normal compressibility of the common femoral, superficial femoral, and popliteal veins, as well as the visualized calf veins. Visualized portions of profunda femoral vein and great saphenous vein unremarkable. No filling defects to suggest DVT on grayscale or color Doppler imaging. Doppler waveforms show normal direction of venous flow, normal respiratory plasticity and response to augmentation. Limited views of the contralateral common femoral vein are unremarkable. OTHER None. Limitations: Examination limited due to patient's body habitus. IMPRESSION: Negative. Electronically Signed   By: Thornell Sartorius M.D.   On: 07/23/2022 00:13   DG Chest Portable 1 View  Result Date: 07/22/2022 CLINICAL DATA:  Shortness of breath and wheezing for 1 week. EXAM: PORTABLE CHEST 1 VIEW COMPARISON:  05/13/2022 FINDINGS: Cardiac pacemaker. Shallow inspiration. Heart size and pulmonary vascularity are normal for technique. Lungs are clear. No pleural effusions. No pneumothorax. Mediastinal contours appear intact. IMPRESSION: No active disease. Electronically Signed   By: Burman Nieves M.D.   On: 07/22/2022 21:12    Pending Labs Unresulted Labs (From admission, onward)    None       Vitals/Pain Today's Vitals   07/22/22 2316 07/22/22 2359 07/23/22 0000 07/23/22 0006  BP:   (!) 174/98   Pulse: 64  66 66  Resp: 17  (!) 21 (!) 21   Temp:  98.5 F (36.9 C)    TempSrc:  Oral    SpO2: 95%  97% 97%  Weight:      Height:      PainSc:        Isolation Precautions No active isolations  Medications Medications  methylPREDNISolone sodium succinate (SOLU-MEDROL) 125 mg/2 mL injection 125 mg (125 mg Intravenous Given 07/22/22 2033)  albuterol (PROVENTIL,VENTOLIN) solution continuous neb (10 mg/hr Nebulization New Bag/Given 07/22/22 2104)  iohexol (OMNIPAQUE) 350 MG/ML injection 75 mL (has no administration in time range)  albuterol (PROVENTIL) (2.5 MG/3ML) 0.083% nebulizer solution 5 mg (5 mg Nebulization Given 07/22/22 2031)  ipratropium-albuterol (DUONEB) 0.5-2.5 (3) MG/3ML nebulizer solution 3 mL (3 mLs Nebulization Given 07/22/22 2031)  ipratropium (ATROVENT) nebulizer solution 0.5 mg (0.5 mg Nebulization Given 07/22/22 2103)  magnesium sulfate IVPB 2 g 50 mL (0 g Intravenous Stopped 07/22/22 2151)  azithromycin (ZITHROMAX) 500 mg in sodium chloride 0.9 % 250 mL IVPB (0 mg Intravenous Stopped 07/22/22 2158)    Mobility walks     Focused Assessments Pulmonary Assessment Handoff:  Lung sounds: Bilateral Breath Sounds: Expiratory wheezes, Coarse crackles L Breath Sounds: Expiratory wheezes R Breath Sounds: Expiratory wheezes O2 Device: Nasal Cannula O2 Flow Rate (L/min): 2 L/min    R Recommendations: See Admitting Provider Note  Report given to:   Additional Notes:

## 2022-07-23 NOTE — Assessment & Plan Note (Addendum)
-   Not on home oxygen - Wean as able - follow up CTA chest

## 2022-07-23 NOTE — Progress Notes (Signed)
Mobility Specialist - Progress Note   07/23/22 1450  Oxygen Therapy  SpO2 90 %  O2 Device Nasal Cannula  O2 Flow Rate (L/min) 2 L/min  Patient Activity (if Appropriate) Ambulating  Mobility  Activity Ambulated independently in hallway;Ambulated independently to bathroom  Level of Assistance Modified independent, requires aide device or extra time  Assistive Device None  Distance Ambulated (ft) 85 ft  Activity Response Tolerated well  Mobility Referral Yes  $Mobility charge 1 Mobility   Pt received in bed and agreeable to mobility. Prior to ambulating pt requested to use the bathroom. When returning to room pt c/o feeling SOB. No other complaints during session. Pt to bed after session with all needs met.    Pre-mobility: 81 HR, 95% SpO2 (2L [place]) During mobility: 82 HR, 90% SpO2 (2L [place]) Post-mobility: 70 HR, 96% SPO2 (2L [place])  Chief Technology Officer

## 2022-07-24 DIAGNOSIS — J45901 Unspecified asthma with (acute) exacerbation: Secondary | ICD-10-CM | POA: Diagnosis not present

## 2022-07-24 LAB — TROPONIN I (HIGH SENSITIVITY): Troponin I (High Sensitivity): 12 ng/L (ref <18)

## 2022-07-24 MED ORDER — GUAIFENESIN-DM 100-10 MG/5ML PO SYRP
5.0000 mL | ORAL_SOLUTION | ORAL | Status: DC | PRN
  Administered 2022-07-24 – 2022-07-25 (×2): 5 mL via ORAL
  Filled 2022-07-24 (×2): qty 10

## 2022-07-24 MED ORDER — ENOXAPARIN SODIUM 60 MG/0.6ML IJ SOSY
60.0000 mg | PREFILLED_SYRINGE | Freq: Every day | INTRAMUSCULAR | Status: DC
  Administered 2022-07-24 – 2022-07-26 (×3): 60 mg via SUBCUTANEOUS
  Filled 2022-07-24 (×3): qty 0.6

## 2022-07-24 NOTE — Progress Notes (Signed)
Patient resting in bed at this time without any sign of distress.

## 2022-07-24 NOTE — Progress Notes (Signed)
PROGRESS NOTE  Autumn Young GEX:[id number] DOB: 1945/06/05 DOA: 07/22/2022 PCP: Patient, No Pcp Per   LOS: 1 day   Brief Narrative / Interim history: 77 year old female with asthma/COPD, HTN, prior CVA who comes into the hospital with 2 weeks of shortness of breath, coughing, wheezing with minimal relief from home therapies.  She was found to have significant wheezing in the ER, was hypoxic via supplemental oxygen and was admitted to the hospital.  Subjective / 24h Interval events: She is feeling much better this morning, still has wheezing but overall her shortness of breath has improved.  Assesement and Plan: Principal Problem:   Asthma exacerbation Active Problems:   Acute respiratory failure with hypoxia   Essential hypertension   Mixed hyperlipidemia   OSA (obstructive sleep apnea)   History of CVA (cerebrovascular accident)   Principal problem Acute hypoxic respiratory failure due to asthma exacerbation -patient started on nebulizers, steroids, empiric azithromycin with improvement in her respiratory status.  She is still requiring supplemental oxygen has some wheezing but overall improved when compared to admission.  Continue current care, wean off to room air, encouraged ambulation  Active problems History of CVA -continue Plavix  OSA -not using CPAP routinely at home  Hyperlipidemia -hold all meds I really stressed  Essential hypertension -continue propranolol and irbesartan  Obesity, morbid -BMI 47, she would benefit from weight  Scheduled Meds:  arformoterol  15 mcg Nebulization BID   azithromycin  500 mg Oral Q24H   budesonide (PULMICORT) nebulizer solution  0.25 mg Nebulization BID   clopidogrel  75 mg Oral q AM   enoxaparin (LOVENOX) injection  60 mg Subcutaneous Daily   ipratropium-albuterol  3 mL Nebulization QID   irbesartan  150 mg Oral Daily   methylPREDNISolone (SOLU-MEDROL) injection  125 mg Intravenous Daily   montelukast  10 mg Oral QHS    pantoprazole  40 mg Oral QAC breakfast   propranolol  20 mg Oral BID   sodium chloride flush  3 mL Intravenous Q12H   Continuous Infusions: PRN Meds:.acetaminophen **OR** acetaminophen, hydrALAZINE, ipratropium-albuterol, ondansetron (ZOFRAN) IV, mouth rinse  Current Outpatient Medications  Medication Instructions   acetaminophen (TYLENOL) 500 mg, Oral, Every 6 hours PRN   albuterol (PROVENTIL HFA) 108 (90 Base) MCG/ACT inhaler 2 puffs, Inhalation, Every 6 hours PRN   ascorbic acid (VITAMIN C) 1,000 mg, Oral, Daily   BREO ELLIPTA 200-25 MCG/ACT AEPB 1 puff, Inhalation, 2 times daily   Carboxymethylcellulose Sodium (ARTIFICIAL TEARS OP) 1 drop, Both Eyes, At bedtime PRN   Cholecalciferol 1,000 Units, Oral, Every morning   clopidogrel (PLAVIX) 75 mg, Oral, Every morning   Cyanocobalamin (VITAMIN B-12 PO) 6 drops, Oral, Every morning   montelukast (SINGULAIR) 10 mg, Oral, Daily at bedtime   olmesartan (BENICAR) 20 mg, Daily at bedtime   Omega-3 Fatty Acids (OMEGA-3 PO) 1 capsule, Oral, Every morning   pantoprazole (PROTONIX) 40 mg, Oral, Daily before breakfast   propranolol (INDERAL) 20 mg, Oral, 2 times daily   PYRIDOXINE HCL PO 2 capsules, Oral, Every morning   Red Yeast Rice Extract (RED YEAST RICE PO) 2 tablets, Oral, Daily   SPIRIVA RESPIMAT 1.25 MCG/ACT AERS 1 puff, Inhalation, 2 times daily    Diet Orders (From admission, onward)     Start     Ordered   07/23/22 0237  Diet regular Room service appropriate? Yes; Fluid consistency: Thin  Diet effective now       Question Answer Comment  Room service appropriate? Yes  Fluid consistency: Thin      07/23/22 0236            DVT prophylaxis:    Lab Results  Component Value Date   PLT 295 07/22/2022      Code Status: Full Code  Family Communication: no family at bedside  Status is: Inpatient  Remains inpatient appropriate because: persistent wheezing, O2 use   Level of care: Progressive  Consultants:   none  Objective: Vitals:   07/24/22 0104 07/24/22 0506 07/24/22 0506 07/24/22 0826  BP: (!) 132/58 137/68 137/68   Pulse: 74 (!) 59 60   Resp: 20 20 20    Temp: 97.7 F (36.5 C) 97.8 F (36.6 C) 97.8 F (36.6 C)   TempSrc: Oral Oral Oral   SpO2: 95% 97% 96% 96%  Weight:      Height:        Intake/Output Summary (Last 24 hours) at 07/24/2022 1012 Last data filed at 07/24/2022 0516 Gross per 24 hour  Intake 705 ml  Output 0 ml  Net 705 ml   Wt Readings from Last 3 Encounters:  07/22/22 133.8 kg  05/13/22 133.8 kg  04/02/22 133.3 kg    Examination:  Constitutional: NAD Eyes: no scleral icterus ENMT: Mucous membranes are moist.  Neck: normal, supple Respiratory: End expiratory wheezing present Cardiovascular: Regular rate and rhythm, no murmurs / rubs / gallops. No LE edema.  Abdomen: non distended, no tenderness. Bowel sounds positive.  Musculoskeletal: no clubbing / cyanosis.    Data Reviewed: I have independently reviewed following labs and imaging studies   CBC Recent Labs  Lab 07/22/22 2031  WBC 6.4  HGB 11.6*  HCT 37.2  PLT 295  MCV 87.7  MCH 27.4  MCHC 31.2  RDW 13.0  LYMPHSABS 1.9  MONOABS 0.7  EOSABS 0.3  BASOSABS 0.1    Recent Labs  Lab 07/22/22 2031 07/22/22 2032  NA 137  --   K 3.7  --   CL 102  --   CO2 28  --   GLUCOSE 115*  --   BUN 15  --   CREATININE 0.93  --   CALCIUM 8.6*  --   AST 34  --   ALT 21  --   ALKPHOS 82  --   BILITOT 0.2*  --   ALBUMIN 3.3*  --   BNP  --  68.0    ------------------------------------------------------------------------------------------------------------------ No results for input(s): "CHOL", "HDL", "LDLCALC", "TRIG", "CHOLHDL", "LDLDIRECT" in the last 72 hours.  Lab Results  Component Value Date   HGBA1C 5.8 (H) 06/07/2021   ------------------------------------------------------------------------------------------------------------------ No results for input(s): "TSH", "T4TOTAL",  "T3FREE", "THYROIDAB" in the last 72 hours.  Invalid input(s): "FREET3"  Cardiac Enzymes No results for input(s): "CKMB", "TROPONINI", "MYOGLOBIN" in the last 168 hours.  Invalid input(s): "CK" ------------------------------------------------------------------------------------------------------------------    Component Value Date/Time   BNP 68.0 07/22/2022 2032    CBG: No results for input(s): "GLUCAP" in the last 168 hours.  Recent Results (from the past 240 hour(s))  Resp panel by RT-PCR (RSV, Flu A&B, Covid) Anterior Nasal Swab     Status: None   Collection Time: 07/22/22  8:41 PM   Specimen: Anterior Nasal Swab  Result Value Ref Range Status   SARS Coronavirus 2 by RT PCR NEGATIVE NEGATIVE Final    Comment: (NOTE) SARS-CoV-2 target nucleic acids are NOT DETECTED.  The SARS-CoV-2 RNA is generally detectable in upper respiratory specimens during the acute phase of infection. The lowest concentration of SARS-CoV-2  viral copies this assay can detect is 138 copies/mL. A negative result does not preclude SARS-Cov-2 infection and should not be used as the sole basis for treatment or other patient management decisions. A negative result may occur with  improper specimen collection/handling, submission of specimen other than nasopharyngeal swab, presence of viral mutation(s) within the areas targeted by this assay, and inadequate number of viral copies(<138 copies/mL). A negative result must be combined with clinical observations, patient history, and epidemiological information. The expected result is Negative.  Fact Sheet for Patients:  BloggerCourse.com  Fact Sheet for Healthcare Providers:  SeriousBroker.it  This test is no t yet approved or cleared by the [place] FDA and  has been authorized for detection and/or diagnosis of SARS-CoV-2 by FDA under an Emergency Use Authorization (EUA). This EUA will remain  in  effect (meaning this test can be used) for the duration of the COVID-19 declaration under Section 564(b)(1) of the Act, 21 U.S.C.section 360bbb-3(b)(1), unless the authorization is terminated  or revoked sooner.       Influenza A by PCR NEGATIVE NEGATIVE Final   Influenza B by PCR NEGATIVE NEGATIVE Final    Comment: (NOTE) The Xpert Xpress SARS-CoV-2/FLU/RSV plus assay is intended as an aid in the diagnosis of influenza from Nasopharyngeal swab specimens and should not be used as a sole basis for treatment. Nasal washings and aspirates are unacceptable for Xpert Xpress SARS-CoV-2/FLU/RSV testing.  Fact Sheet for Patients: BloggerCourse.com  Fact Sheet for Healthcare Providers: SeriousBroker.it  This test is not yet approved or cleared by the [place] FDA and has been authorized for detection and/or diagnosis of SARS-CoV-2 by FDA under an Emergency Use Authorization (EUA). This EUA will remain in effect (meaning this test can be used) for the duration of the COVID-19 declaration under Section 564(b)(1) of the Act, 21 U.S.C. section 360bbb-3(b)(1), unless the authorization is terminated or revoked.     Resp Syncytial Virus by PCR NEGATIVE NEGATIVE Final    Comment: (NOTE) Fact Sheet for Patients: BloggerCourse.com  Fact Sheet for Healthcare Providers: SeriousBroker.it  This test is not yet approved or cleared by the [place] FDA and has been authorized for detection and/or diagnosis of SARS-CoV-2 by FDA under an Emergency Use Authorization (EUA). This EUA will remain in effect (meaning this test can be used) for the duration of the COVID-19 declaration under Section 564(b)(1) of the Act, 21 U.S.C. section 360bbb-3(b)(1), unless the authorization is terminated or revoked.  Performed at [facility], [address] [place], [place] [address]    Respiratory (~20 pathogens) panel by PCR     Status: None   Collection Time: 07/23/22  2:39 AM   Specimen: Nasopharyngeal Swab; Respiratory  Result Value Ref Range Status   Adenovirus NOT DETECTED NOT DETECTED Final   Coronavirus 229E NOT DETECTED NOT DETECTED Final    Comment: (NOTE) The Coronavirus on the Respiratory Panel, DOES NOT test for the novel  Coronavirus (2019 nCoV)    Coronavirus HKU1 NOT DETECTED NOT DETECTED Final   Coronavirus NL63 NOT DETECTED NOT DETECTED Final   Coronavirus OC43 NOT DETECTED NOT DETECTED Final   Metapneumovirus NOT DETECTED NOT DETECTED Final   Rhinovirus / Enterovirus NOT DETECTED NOT DETECTED Final   Influenza A NOT DETECTED NOT DETECTED Final   Influenza B NOT DETECTED NOT DETECTED Final   Parainfluenza Virus 1 NOT DETECTED NOT DETECTED Final   Parainfluenza Virus 2 NOT DETECTED NOT DETECTED Final   Parainfluenza Virus 3 NOT  DETECTED NOT DETECTED Final   Parainfluenza Virus 4 NOT DETECTED NOT DETECTED Final   Respiratory Syncytial Virus NOT DETECTED NOT DETECTED Final   Bordetella pertussis NOT DETECTED NOT DETECTED Final   Bordetella Parapertussis NOT DETECTED NOT DETECTED Final   Chlamydophila pneumoniae NOT DETECTED NOT DETECTED Final   Mycoplasma pneumoniae NOT DETECTED NOT DETECTED Final    Comment: Performed at [facility], [address], [place], [place] [address]     Radiology Studies: DG CHEST PORT 1 VIEW  Result Date: 07/23/2022 CLINICAL DATA:  Chest pain EXAM: PORTABLE CHEST 1 VIEW COMPARISON:  Previous studies including the CTA chest done earlier today FINDINGS: Transverse diameter of heart is increased. There are no signs of alveolar pulmonary edema or new focal infiltrates. There is no pleural effusion or pneumothorax. 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 IMPRESSION: Cardiomegaly. There are no signs of pulmonary edema or focal pulmonary consolidation. Electronically  Signed   By: Ernie Avena M.D.   On: 07/23/2022 20:20     Pamella Pert, MD, PhD [facility]  Between 7 am - 7 pm I am available, please contact me via Amion (for emergencies) or Securechat (non urgent messages)  Between 7 pm - 7 am I am not available, please contact night coverage MD/APP via Amion

## 2022-07-24 NOTE — Progress Notes (Signed)
Mobility Specialist - Progress Note   07/24/22 1102  Oxygen Therapy  SpO2 94 %  O2 Device Nasal Cannula  O2 Flow Rate (L/min) 2 L/min  Patient Activity (if Appropriate) Ambulating  Mobility  Activity Ambulated with assistance in hallway  Level of Assistance Modified independent, requires aide device or extra time  Assistive Device Front wheel walker  Distance Ambulated (ft) 240 ft  Activity Response Tolerated well  Mobility Referral Yes  $Mobility charge 1 Mobility   Pt received in bed and agreeable to mobility. Upon returning to room pt had some SOB but stated she was okay. No other complaints during session. Pt to EOB after session with all needs met.    Pre-mobility: 93% SpO2 During mobility: 94% SpO2 Post-mobility: 95% SPO2  Chief Technology Officer

## 2022-07-25 DIAGNOSIS — J45901 Unspecified asthma with (acute) exacerbation: Secondary | ICD-10-CM | POA: Diagnosis not present

## 2022-07-25 LAB — BASIC METABOLIC PANEL
Anion gap: 7 (ref 5–15)
BUN: 22 mg/dL (ref 8–23)
CO2: 28 mmol/L (ref 22–32)
Calcium: 8.5 mg/dL — ABNORMAL LOW (ref 8.9–10.3)
Chloride: 101 mmol/L (ref 98–111)
Creatinine, Ser: 0.79 mg/dL (ref 0.44–1.00)
GFR, Estimated: >60 mL/min (ref >60)
Glucose, Bld: 138 mg/dL — ABNORMAL HIGH (ref 70–99)
Potassium: 3.7 mmol/L (ref 3.5–5.1)
Sodium: 136 mmol/L (ref 135–145)

## 2022-07-25 LAB — MAGNESIUM: Magnesium: 2.4 mg/dL (ref 1.7–2.4)

## 2022-07-25 NOTE — Progress Notes (Signed)
Pt refused cpap for the night.  

## 2022-07-25 NOTE — Progress Notes (Signed)
PROGRESS NOTE  Autumn Young MWU:[id number] DOB: 1946/03/30 DOA: 07/22/2022 PCP: Patient, No Pcp Per   LOS: 2 days   Brief Narrative / Interim history: 77 year old female with asthma/COPD, HTN, prior CVA who comes into the hospital with 2 weeks of shortness of breath, coughing, wheezing with minimal relief from home therapies.  She was found to have significant wheezing in the ER, was hypoxic via supplemental oxygen and was admitted to the hospital.  Subjective / 24h Interval events: She continues to be improved today.  Appreciate that the wheezing is minimal now, much improved.  Assesement and Plan: Principal Problem:   Asthma exacerbation Active Problems:   Acute respiratory failure with hypoxia (HCC)   Essential hypertension   Mixed hyperlipidemia   OSA (obstructive sleep apnea)   History of CVA (cerebrovascular accident)   Principal problem Acute hypoxic respiratory failure due to asthma exacerbation -patient started on nebulizers, steroids, empiric azithromycin with improvement in her respiratory status.  She has been able to be weaned off to room air at rest, continue to encourage ambulation today.  Wheezing is minimal, anticipate discharge perhaps within 24 hours  Active problems History of CVA -continue Plavix  OSA -not using CPAP routinely at home  Hyperlipidemia -hold all meds I really stressed  Essential hypertension -continue propranolol and irbesartan  Obesity, morbid -BMI 47, she would benefit from weight  Scheduled Meds:  arformoterol  15 mcg Nebulization BID   azithromycin  500 mg Oral Q24H   budesonide (PULMICORT) nebulizer solution  0.25 mg Nebulization BID   clopidogrel  75 mg Oral q AM   enoxaparin (LOVENOX) injection  60 mg Subcutaneous Daily   ipratropium-albuterol  3 mL Nebulization QID   irbesartan  150 mg Oral Daily   methylPREDNISolone (SOLU-MEDROL) injection  125 mg Intravenous Daily   montelukast  10 mg Oral QHS   pantoprazole  40 mg Oral  QAC breakfast   propranolol  20 mg Oral BID   sodium chloride flush  3 mL Intravenous Q12H   Continuous Infusions: PRN Meds:.acetaminophen **OR** acetaminophen, guaiFENesin-dextromethorphan, hydrALAZINE, ipratropium-albuterol, ondansetron (ZOFRAN) IV, mouth rinse  Current Outpatient Medications  Medication Instructions   acetaminophen (TYLENOL) 500 mg, Oral, Every 6 hours PRN   albuterol (PROVENTIL HFA) 108 (90 Base) MCG/ACT inhaler 2 puffs, Inhalation, Every 6 hours PRN   ascorbic acid (VITAMIN C) 1,000 mg, Oral, Daily   BREO ELLIPTA 200-25 MCG/ACT AEPB 1 puff, Inhalation, 2 times daily   Carboxymethylcellulose Sodium (ARTIFICIAL TEARS OP) 1 drop, Both Eyes, At bedtime PRN   Cholecalciferol 1,000 Units, Oral, Every morning   clopidogrel (PLAVIX) 75 mg, Oral, Every morning   Cyanocobalamin (VITAMIN B-12 PO) 6 drops, Oral, Every morning   montelukast (SINGULAIR) 10 mg, Oral, Daily at bedtime   olmesartan (BENICAR) 20 mg, Daily at bedtime   Omega-3 Fatty Acids (OMEGA-3 PO) 1 capsule, Oral, Every morning   pantoprazole (PROTONIX) 40 mg, Oral, Daily before breakfast   propranolol (INDERAL) 20 mg, Oral, 2 times daily   PYRIDOXINE HCL PO 2 capsules, Oral, Every morning   Red Yeast Rice Extract (RED YEAST RICE PO) 2 tablets, Oral, Daily   SPIRIVA RESPIMAT 1.25 MCG/ACT AERS 1 puff, Inhalation, 2 times daily    Diet Orders (From admission, onward)     Start     Ordered   07/23/22 0237  Diet regular Room service appropriate? Yes; Fluid consistency: Thin  Diet effective now       Question Answer Comment  Room service appropriate? Yes  Fluid consistency: Thin      07/23/22 0236            DVT prophylaxis:    Lab Results  Component Value Date   PLT 295 07/22/2022      Code Status: Full Code  Family Communication: no family at bedside  Status is: Inpatient  Remains inpatient appropriate because: persistent wheezing, O2 use   Level of care: Progressive  Consultants:   none  Objective: Vitals:   07/24/22 1950 07/24/22 1953 07/25/22 0541 07/25/22 0729  BP:  (!) 176/84 (!) 157/65   Pulse:  (!) 59 78   Resp:  18 18 20   Temp:  98.1 F (36.7 C) 97.6 F (36.4 C)   TempSrc:  Oral Oral   SpO2: 92% 97% 95% 94%  Weight:      Height:        Intake/Output Summary (Last 24 hours) at 07/25/2022 0957 Last data filed at 07/25/2022 0900 Gross per 24 hour  Intake 600 ml  Output 350 ml  Net 250 ml    Wt Readings from Last 3 Encounters:  07/22/22 133.8 kg  05/13/22 133.8 kg  04/02/22 133.3 kg    Examination:  Constitutional: NAD Eyes: lids and conjunctivae normal, no scleral icterus ENMT: mmm Neck: normal, supple Respiratory: Very faint end expiratory wheezing Cardiovascular: Regular rate and rhythm, no murmurs / rubs / gallops. No LE edema. Abdomen: soft, no distention, no tenderness. Bowel sounds positive.  Skin: no rashes Neurologic: no focal deficits, equal strength   Data Reviewed: I have independently reviewed following labs and imaging studies   CBC Recent Labs  Lab 07/22/22 2031  WBC 6.4  HGB 11.6*  HCT 37.2  PLT 295  MCV 87.7  MCH 27.4  MCHC 31.2  RDW 13.0  LYMPHSABS 1.9  MONOABS 0.7  EOSABS 0.3  BASOSABS 0.1     Recent Labs  Lab 07/22/22 2031 07/22/22 2032 07/25/22 0358  NA 137  --  136  K 3.7  --  3.7  CL 102  --  101  CO2 28  --  28  GLUCOSE 115*  --  138*  BUN 15  --  22  CREATININE 0.93  --  0.79  CALCIUM 8.6*  --  8.5*  AST 34  --   --   ALT 21  --   --   ALKPHOS 82  --   --   BILITOT 0.2*  --   --   ALBUMIN 3.3*  --   --   MG  --   --  2.4  BNP  --  68.0  --      ------------------------------------------------------------------------------------------------------------------ No results for input(s): "CHOL", "HDL", "LDLCALC", "TRIG", "CHOLHDL", "LDLDIRECT" in the last 72 hours.  Lab Results  Component Value Date   HGBA1C 5.8 (H) 06/07/2021    ------------------------------------------------------------------------------------------------------------------ No results for input(s): "TSH", "T4TOTAL", "T3FREE", "THYROIDAB" in the last 72 hours.  Invalid input(s): "FREET3"  Cardiac Enzymes No results for input(s): "CKMB", "TROPONINI", "MYOGLOBIN" in the last 168 hours.  Invalid input(s): "CK" ------------------------------------------------------------------------------------------------------------------    Component Value Date/Time   BNP 68.0 07/22/2022 2032    CBG: No results for input(s): "GLUCAP" in the last 168 hours.  Recent Results (from the past 240 hour(s))  Resp panel by RT-PCR (RSV, Flu A&B, Covid) Anterior Nasal Swab     Status: None   Collection Time: 07/22/22  8:41 PM   Specimen: Anterior Nasal Swab  Result Value Ref Range Status   SARS  Coronavirus 2 by RT PCR NEGATIVE NEGATIVE Final    Comment: (NOTE) SARS-CoV-2 target nucleic acids are NOT DETECTED.  The SARS-CoV-2 RNA is generally detectable in upper respiratory specimens during the acute phase of infection. The lowest concentration of SARS-CoV-2 viral copies this assay can detect is 138 copies/mL. A negative result does not preclude SARS-Cov-2 infection and should not be used as the sole basis for treatment or other patient management decisions. A negative result may occur with  improper specimen collection/handling, submission of specimen other than nasopharyngeal swab, presence of viral mutation(s) within the areas targeted by this assay, and inadequate number of viral copies(<138 copies/mL). A negative result must be combined with clinical observations, patient history, and epidemiological information. The expected result is Negative.  Fact Sheet for Patients:  BloggerCourse.com  Fact Sheet for Healthcare Providers:  SeriousBroker.it  This test is no t yet approved or cleared by the [place] FDA and  has been authorized for detection and/or diagnosis of SARS-CoV-2 by FDA under an Emergency Use Authorization (EUA). This EUA will remain  in effect (meaning this test can be used) for the duration of the COVID-19 declaration under Section 564(b)(1) of the Act, 21 U.S.C.section 360bbb-3(b)(1), unless the authorization is terminated  or revoked sooner.       Influenza A by PCR NEGATIVE NEGATIVE Final   Influenza B by PCR NEGATIVE NEGATIVE Final    Comment: (NOTE) The Xpert Xpress SARS-CoV-2/FLU/RSV plus assay is intended as an aid in the diagnosis of influenza from Nasopharyngeal swab specimens and should not be used as a sole basis for treatment. Nasal washings and aspirates are unacceptable for Xpert Xpress SARS-CoV-2/FLU/RSV testing.  Fact Sheet for Patients: BloggerCourse.com  Fact Sheet for Healthcare Providers: SeriousBroker.it  This test is not yet approved or cleared by the [place] FDA and has been authorized for detection and/or diagnosis of SARS-CoV-2 by FDA under an Emergency Use Authorization (EUA). This EUA will remain in effect (meaning this test can be used) for the duration of the COVID-19 declaration under Section 564(b)(1) of the Act, 21 U.S.C. section 360bbb-3(b)(1), unless the authorization is terminated or revoked.     Resp Syncytial Virus by PCR NEGATIVE NEGATIVE Final    Comment: (NOTE) Fact Sheet for Patients: BloggerCourse.com  Fact Sheet for Healthcare Providers: SeriousBroker.it  This test is not yet approved or cleared by the [place] FDA and has been authorized for detection and/or diagnosis of SARS-CoV-2 by FDA under an Emergency Use Authorization (EUA). This EUA will remain in effect (meaning this test can be used) for the duration of the COVID-19 declaration under Section 564(b)(1) of the Act, 21 U.S.C. section  360bbb-3(b)(1), unless the authorization is terminated or revoked.  Performed at [facility], [address] [place], [place] [address]   Respiratory (~20 pathogens) panel by PCR     Status: None   Collection Time: 07/23/22  2:39 AM   Specimen: Nasopharyngeal Swab; Respiratory  Result Value Ref Range Status   Adenovirus NOT DETECTED NOT DETECTED Final   Coronavirus 229E NOT DETECTED NOT DETECTED Final    Comment: (NOTE) The Coronavirus on the Respiratory Panel, DOES NOT test for the novel  Coronavirus (2019 nCoV)    Coronavirus HKU1 NOT DETECTED NOT DETECTED Final   Coronavirus NL63 NOT DETECTED NOT DETECTED Final   Coronavirus OC43 NOT DETECTED NOT DETECTED Final   Metapneumovirus NOT DETECTED NOT DETECTED Final   Rhinovirus / Enterovirus NOT DETECTED NOT DETECTED Final  Influenza A NOT DETECTED NOT DETECTED Final   Influenza B NOT DETECTED NOT DETECTED Final   Parainfluenza Virus 1 NOT DETECTED NOT DETECTED Final   Parainfluenza Virus 2 NOT DETECTED NOT DETECTED Final   Parainfluenza Virus 3 NOT DETECTED NOT DETECTED Final   Parainfluenza Virus 4 NOT DETECTED NOT DETECTED Final   Respiratory Syncytial Virus NOT DETECTED NOT DETECTED Final   Bordetella pertussis NOT DETECTED NOT DETECTED Final   Bordetella Parapertussis NOT DETECTED NOT DETECTED Final   Chlamydophila pneumoniae NOT DETECTED NOT DETECTED Final   Mycoplasma pneumoniae NOT DETECTED NOT DETECTED Final    Comment: Performed at [facility], [address] [place], [place] [address]     Radiology Studies: No results found.   Pamella Pert, MD, PhD [facility]  Between 7 am - 7 pm I am available, please contact me via Amion (for emergencies) or Securechat (non urgent messages)  Between 7 pm - 7 am I am not available, please contact night coverage MD/APP via Amion

## 2022-07-25 NOTE — Progress Notes (Signed)
PT refused CPAP.  

## 2022-07-25 NOTE — TOC Initial Note (Addendum)
Transition of Care [facility]) - Initial/Assessment Note    Patient Details  Name: Autumn Young MRN: [id number] Date of Birth: 1945/05/05  Transition [facility] Care [facility]) CM/SW Contact:    Howell Rucks, RN Phone Number: 07/25/2022, 10:41 AM  Clinical Narrative:  [facility] referral for Home                Nebulizer Machine. NCM spoke with pt over phone, introduced TOC/NCM role, no preference for Nebulizer. CM will outreach DME agencies. Pt confirmed transport home. Will continue to follow - 1:28pm [employer] [facility] for Home Nebulizer machine- rep-Jason.    Expected Discharge Plan: Home/Self Care Barriers to Discharge: Continued Medical Work up   Patient Goals and CMS Choice Patient states their goals for this hospitalization and ongoing recovery are:: Home CMS Medicare.gov Compare Post Acute Care list provided to:: Patient Choice offered to / list presented to : Patient      Expected Discharge Plan and Services   Discharge Planning Services: CM Consult                     DME Arranged: Nebulizer machine                    Prior Living Arrangements/Services   Lives with:: Self   Do you feel safe going back to the place where you live?: Yes      Need for Family Participation in Patient Care: Yes (Comment) Care giver support system in place?: Yes (comment)   Criminal Activity/Legal Involvement Pertinent to Current Situation/Hospitalization: No - Comment as needed  Activities of Daily Living Home Assistive Devices/Equipment: Cane (specify quad or straight) ADL Screening (condition at time of admission) Patient's cognitive ability adequate to safely complete daily activities?: Yes Is the patient deaf or have difficulty hearing?: No Does the patient have difficulty seeing, even when wearing glasses/contacts?: No Does the patient have difficulty concentrating, remembering, or making decisions?: No Patient able to express need for assistance with ADLs?: Yes Does the patient have  difficulty dressing or bathing?: No Independently performs ADLs?: Yes (appropriate for developmental age) Does the patient have difficulty walking or climbing stairs?: Yes Weakness of Legs: None Weakness of Arms/Hands: None  Permission Sought/Granted Permission sought to share information with : Case Manager Permission granted to share information with : Yes, Verbal Permission Granted  Share Information with NAME: Fannie Knee, RN           Emotional Assessment       Orientation: : Oriented to Self, Oriented to Place, Oriented to  Time, Oriented to Situation Alcohol / Substance Use: Not Applicable Psych Involvement: No (comment)  Admission diagnosis:  Hypoxia [R09.02] Asthma exacerbation [J45.901] Exacerbation of persistent asthma, unspecified asthma severity [J45.901] Acute cough [R05.1] Patient Active Problem List   Diagnosis Date Noted   Acute respiratory failure with hypoxia (HCC) 07/23/2022   Asthma exacerbation 07/22/2022   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   Dyslipidemia 08/06/2021   Paresthesia 06/07/2021   Syncope 06/07/2021   History of CVA (cerebrovascular accident) 06/07/2021   COPD exacerbation (HCC) 08/10/2017   OSA (obstructive sleep apnea) 11/28/2015   COPD (chronic obstructive pulmonary disease) with chronic bronchitis 06/26/2015   Pulmonary hypertension (HCC) 06/26/2015   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   Mild intermittent asthma without complication 11/19/2014   Mixed hyperlipidemia 11/19/2014   CVA (cerebral vascular accident) (HCC) 11/18/2014   Essential hypertension 11/18/2014   PCP:  Patient, No Pcp Per Pharmacy:   [id number] [place],  - [address] AT [facility] [address] [place] [address] Phone: [phone] Fax: [phone]     Social Determinants of Health (SDOH) Social History: SDOH Screenings   Food Insecurity: No Food Insecurity (07/23/2022)  Housing: Low Risk  (07/23/2022)   Transportation Needs: No Transportation Needs (07/23/2022)  Utilities: Not At Risk (07/23/2022)  Depression (PHQ2-9): Low Risk  (08/06/2021)  Tobacco Use: Low Risk  (07/22/2022)   SDOH Interventions:     Readmission Risk Interventions    07/25/2022   10:35 AM  Readmission Risk Prevention Plan  Transportation Screening Complete  PCP or Specialist Appt within 5-7 Days Complete  Home Care Screening Complete  Medication Review (RN CM) Complete

## 2022-07-26 ENCOUNTER — Inpatient Hospital Stay (HOSPITAL_COMMUNITY): Payer: Medicare HMO

## 2022-07-26 DIAGNOSIS — J45901 Unspecified asthma with (acute) exacerbation: Secondary | ICD-10-CM | POA: Diagnosis not present

## 2022-07-26 MED ORDER — GUAIFENESIN-DM 100-10 MG/5ML PO SYRP: 5.0000 mL | ORAL_SOLUTION | ORAL | 0 refills | Status: DC | PRN

## 2022-07-26 MED ORDER — AZITHROMYCIN 500 MG PO TABS: 500.0000 mg | ORAL_TABLET | Freq: Every day | ORAL | 0 refills | Status: AC

## 2022-07-26 MED ORDER — ASPIRIN 81 MG PO TBEC: 81.0000 mg | DELAYED_RELEASE_TABLET | Freq: Every day | ORAL | 2 refills | Status: DC

## 2022-07-26 MED ORDER — ALBUTEROL SULFATE HFA 108 (90 BASE) MCG/ACT IN AERS: 2.0000 | INHALATION_SPRAY | Freq: Four times a day (QID) | RESPIRATORY_TRACT | 0 refills | Status: DC | PRN

## 2022-07-26 MED ORDER — PANTOPRAZOLE SODIUM 40 MG PO TBEC: 40.0000 mg | DELAYED_RELEASE_TABLET | Freq: Every day | ORAL | 1 refills | Status: DC

## 2022-07-26 MED ORDER — OLMESARTAN MEDOXOMIL 20 MG PO TABS: 20.0000 mg | ORAL_TABLET | Freq: Every day | ORAL | 1 refills | Status: DC

## 2022-07-26 MED ORDER — IPRATROPIUM-ALBUTEROL 0.5-2.5 (3) MG/3ML IN SOLN: 3.0000 mL | Freq: Four times a day (QID) | RESPIRATORY_TRACT | 0 refills | Status: DC | PRN

## 2022-07-26 MED ORDER — BREO ELLIPTA 200-25 MCG/ACT IN AEPB: 1.0000 | INHALATION_SPRAY | Freq: Two times a day (BID) | RESPIRATORY_TRACT | 1 refills | Status: DC

## 2022-07-26 MED ORDER — PREDNISONE 10 MG PO TABS: ORAL_TABLET | ORAL | 0 refills | Status: AC

## 2022-07-26 NOTE — Discharge Summary (Signed)
Physician Discharge Summary  KRISTEN FROMM ZOX:[id number] DOB: 29-Apr-1945 DOA: 07/22/2022  PCP: Patient, No Pcp Per  Admit date: 07/22/2022 Discharge date: 07/26/2022  Admitted From: home Disposition:  home  Recommendations for Outpatient Follow-up:  Follow up with PCP in 1-2 weeks Establish care with pulmonology and follow-up as soon as possible, preferably in 2 weeks  Home Health: none Equipment/Devices: none  Discharge Condition: stable CODE STATUS: Full code  HPI: Per admitting MD, Ms. Lora is a 77 yo female with PMH asthma, COPD, GERD, HTN, CVA who presented to [facility] with approximately 2-week complaint of shortness of breath, ongoing coughing and wheezing.  She has had minimal relief from home breathing treatments.  She was also found to be hypoxic with oxygen in the 80s on workup. She had wheezing on exam with concerns for asthma exacerbation.  CXR was unremarkable.  She also had reported that her left leg had began swelling and left lower extremity duplex was obtained which was negative for DVT. She was transferred for ongoing asthma exacerbation treatment and further workup as necessary.  Hospital Course / Discharge diagnoses: Principal Problem:   Asthma exacerbation Active Problems:   Acute respiratory failure with hypoxia (HCC)   Essential hypertension   Mixed hyperlipidemia   OSA (obstructive sleep apnea)   History of CVA (cerebrovascular accident)   Principal problem Acute hypoxic respiratory failure due to asthma exacerbation -patient started on nebulizers, steroids, empiric azithromycin with improvement in her respiratory status.  She has been able to be weaned off to room air, feeling back to baseline, wheezing has resolved and she is able to ambulate without difficulties.  She will be transitioned to prednisone with a taper for home, couple more days of azithromycin to finish 5-day course, and will be discharged home in stable condition.  Her primary pulmonologist  retired from [facility], and she wants to establish with pulmonology in the area, information was given for [facility] pulmonology   Active problems History of CVA -she is not been taking Plavix for the past 6 months and only is taking aspirin.  Continue that, defer to PCP further changes  OSA -not using CPAP routinely at home Hyperlipidemia Essential hypertension -continue home medications Obesity, morbid -BMI 47, she would benefit from weight  Sepsis ruled out   Discharge Instructions   Allergies as of 07/26/2022       Reactions   Shellfish Allergy Anaphylaxis   Zocor [simvastatin] Swelling   Lasix [furosemide] Swelling   Latex Rash   Penicillins Rash        Medication List     STOP taking these medications    clopidogrel 75 MG tablet Commonly known as: PLAVIX       TAKE these medications    acetaminophen 500 MG tablet Commonly known as: TYLENOL Take 500 mg by mouth every 6 (six) hours as needed (pain).   albuterol 108 (90 Base) MCG/ACT inhaler Commonly known as: Proventil HFA Inhale 2 puffs into the lungs every 6 (six) hours as needed for wheezing or shortness of breath. What changed: when to take this   ARTIFICIAL TEARS OP Place 1 drop into both eyes at bedtime as needed (dryness).   ascorbic acid 1000 MG tablet Commonly known as: VITAMIN C Take 1,000 mg by mouth daily.   aspirin EC 81 MG tablet Take 1 tablet (81 mg total) by mouth daily. Swallow whole.   azithromycin 500 MG tablet Commonly known as: Zithromax Take 1 tablet (500 mg total) by mouth daily  for 2 days.   Breo Ellipta 200-25 MCG/ACT Aepb Generic drug: fluticasone furoate-vilanterol Inhale 1 puff into the lungs in the morning and at bedtime.   Cholecalciferol 25 MCG (1000 UT) tablet Take 1,000 Units by mouth in the morning.   guaiFENesin-dextromethorphan 100-10 MG/5ML syrup Commonly known as: ROBITUSSIN DM Take 5 mLs by mouth every 4 (four) hours as needed for cough.    ipratropium-albuterol 0.5-2.5 (3) MG/3ML Soln Commonly known as: DUONEB Take 3 mLs by nebulization every 6 (six) hours as needed.   montelukast 10 MG tablet Commonly known as: SINGULAIR Take 10 mg by mouth at bedtime.   olmesartan 20 MG tablet Commonly known as: BENICAR Take 1 tablet (20 mg total) by mouth at bedtime.   OMEGA-3 PO Take 1 capsule by mouth in the morning.   pantoprazole 40 MG tablet Commonly known as: PROTONIX Take 1 tablet (40 mg total) by mouth daily before breakfast.   predniSONE 10 MG tablet Commonly known as: DELTASONE Take 4 tablets (40 mg total) by mouth daily for 2 days, THEN 3 tablets (30 mg total) daily for 2 days, THEN 2 tablets (20 mg total) daily for 2 days, THEN 1 tablet (10 mg total) daily for 2 days. Start taking on: July 26, 2022   propranolol 20 MG tablet Commonly known as: INDERAL Take 1 tablet (20 mg total) by mouth 2 (two) times daily.   PYRIDOXINE HCL PO Take 2 capsules by mouth in the morning.   RED YEAST RICE PO Take 2 tablets by mouth daily.   Spiriva Respimat 1.25 MCG/ACT Aers Generic drug: Tiotropium Bromide Monohydrate Inhale 1 puff into the lungs in the morning and at bedtime.   VITAMIN B-12 PO Take 6 drops by mouth in the morning.               Durable Medical Equipment  (From admission, onward)           Start     Ordered   07/25/22 0959  For home use only DME Nebulizer machine  Once       Question Answer Comment  Patient needs a nebulizer to treat with the following condition COPD (chronic obstructive pulmonary disease) (HCC)   Length of Need Lifetime      07/25/22 0959            Follow-up Information     [facility] Follow up in 2 week(s).   Specialty: Pulmonology Contact information: [address] [place] [address] [phone]                Consultations: none  Procedures/Studies:  DG CHEST PORT 1  VIEW  Result Date: 07/23/2022 CLINICAL DATA:  Chest pain EXAM: PORTABLE CHEST 1 VIEW COMPARISON:  Previous studies including the CTA chest done earlier today FINDINGS: Transverse diameter of heart is increased. There are no signs of alveolar pulmonary edema or new focal infiltrates. There is no pleural effusion or pneumothorax. 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 IMPRESSION: Cardiomegaly. There are no signs of pulmonary edema or focal pulmonary consolidation. Electronically Signed   By: Ernie Avena M.D.   On: 07/23/2022 20:20   CT Angio Chest PE W and/or Wo Contrast  Result Date: 07/23/2022 CLINICAL DATA:  Pulmonary embolism suspected, high probability. Shortness of breath. EXAM: CT ANGIOGRAPHY CHEST WITH CONTRAST TECHNIQUE: Multidetector CT imaging of the chest was performed using the standard protocol during  bolus administration of intravenous contrast. Multiplanar CT image reconstructions and MIPs were obtained to evaluate the vascular anatomy. RADIATION DOSE REDUCTION: This exam was performed according to the departmental dose-optimization program which includes automated exposure control, adjustment of the mA and/or kV according to patient size and/or use of iterative reconstruction technique. CONTRAST:  75mL OMNIPAQUE IOHEXOL 350 MG/ML SOLN COMPARISON:  CT scan of the chest 08/10/2017 FINDINGS: Cardiovascular: Satisfactory opacification of the pulmonary arteries to the segmental level. No evidence of pulmonary embolism. Normal heart size. No pericardial effusion. Conventional 3 vessel arch anatomy. Trace atherosclerotic calcifications along the aorta. Left subclavian approach cardiac rhythm maintenance device in place with leads terminating in the right atrium and right ventricle. Mediastinum/Nodes: Unremarkable thyroid gland. Slightly enlarged mediastinal and right hilar lymphoid tissue compared to prior imaging from 2019. Low right  paratracheal lymph node measures 1.4 cm in short axis compared to 1.3 cm previously. Right hilar nodal tissue is essentially the same at 1.3 cm. Increased prominence of small lymph nodes along the right lower bronchovascular bundle. The increased prominence over the past 5 years is most consistent with reactive adenopathy. Large sliding hiatal hernia. Lungs/Pleura: Diffuse bronchial wall thickening progressed compared to prior. Scattered areas of atelectasis. No focal airspace infiltrate. No pneumothorax or pleural effusion. Upper Abdomen: Relative hypertrophy of the left hepatic lobe and caudate lobe with atrophy of the right lobe. Probable recanalized periumbilical vein. Findings suggest cirrhosis. No acute abnormality in the upper abdomen. Musculoskeletal: No acute fracture or aggressive appearing lytic or blastic osseous lesion. Review of the MIP images confirms the above findings. IMPRESSION: 1. Negative for acute pulmonary embolus. 2. Diffuse bronchial wall thickening has progressed compared to prior imaging from 2019. Findings suggest acute on chronic bronchitis. 3. Slightly increased prominence of mediastinal and right hilar lymphoid tissue is very likely reactive in nature. 4. Scattered areas of subsegmental atelectasis. 5. Aortic and coronary artery atherosclerotic vascular calcifications. 6. Hepatic cirrhosis. Aortic Atherosclerosis (ICD10-I70.0). Electronically Signed   By: Malachy Moan M.D.   On: 07/23/2022 10:14   US Venous Img Lower Unilateral Left  Result Date: 07/23/2022 CLINICAL DATA:  Left ankle swelling with shortness of breath and wheezing. EXAM: LEFT LOWER EXTREMITY VENOUS DOPPLER ULTRASOUND TECHNIQUE: Gray-scale sonography with compression, as well as color and duplex ultrasound, were performed to evaluate the deep venous system(s) from the level of the common femoral vein through the popliteal and proximal calf veins. COMPARISON:  None Available. FINDINGS: VENOUS Normal  compressibility of the common femoral, superficial femoral, and popliteal veins, as well as the visualized calf veins. Visualized portions of profunda femoral vein and great saphenous vein unremarkable. No filling defects to suggest DVT on grayscale or color Doppler imaging. Doppler waveforms show normal direction of venous flow, normal respiratory plasticity and response to augmentation. Limited views of the contralateral common femoral vein are unremarkable. OTHER None. Limitations: Examination limited due to patient's body habitus. IMPRESSION: Negative. Electronically Signed   By: Thornell Sartorius M.D.   On: 07/23/2022 00:13   DG Chest Portable 1 View  Result Date: 07/22/2022 CLINICAL DATA:  Shortness of breath and wheezing for 1 week. EXAM: PORTABLE CHEST 1 VIEW COMPARISON:  05/13/2022 FINDINGS: Cardiac pacemaker. Shallow inspiration. Heart size and pulmonary vascularity are normal for technique. Lungs are clear. No pleural effusions. No pneumothorax. Mediastinal contours appear intact. IMPRESSION: No active disease. Electronically Signed   By: Burman Nieves M.D.   On: 07/22/2022 21:12    Subjective: - no chest pain, shortness of breath, no  abdominal pain, nausea or vomiting.   Discharge Exam: BP (!) 168/78 (BP Location: Right Arm)   Pulse 61   Temp 98.1 F (36.7 C) (Oral)   Resp 18   Ht 5\' 6"  (1.676 m)   Wt 133.8 kg   SpO2 95%   BMI 47.61 kg/m   General: Pt is alert, awake, not in acute distress Cardiovascular: RRR, S1/S2 +, no rubs, no gallops Respiratory: CTA bilaterally, no wheezing, no rhonchi Abdominal: Soft, NT, ND, bowel sounds + Extremities: no edema, no cyanosis   The results of significant diagnostics from this hospitalization (including imaging, microbiology, ancillary and laboratory) are listed below for reference.     Microbiology: Recent Results (from the past 240 hour(s))  Resp panel by RT-PCR (RSV, Flu A&B, Covid) Anterior Nasal Swab     Status: None    Collection Time: 07/22/22  8:41 PM   Specimen: Anterior Nasal Swab  Result Value Ref Range Status   SARS Coronavirus 2 by RT PCR NEGATIVE NEGATIVE Final    Comment: (NOTE) SARS-CoV-2 target nucleic acids are NOT DETECTED.  The SARS-CoV-2 RNA is generally detectable in upper respiratory specimens during the acute phase of infection. The lowest concentration of SARS-CoV-2 viral copies this assay can detect is 138 copies/mL. A negative result does not preclude SARS-Cov-2 infection and should not be used as the sole basis for treatment or other patient management decisions. A negative result may occur with  improper specimen collection/handling, submission of specimen other than nasopharyngeal swab, presence of viral mutation(s) within the areas targeted by this assay, and inadequate number of viral copies(<138 copies/mL). A negative result must be combined with clinical observations, patient history, and epidemiological information. The expected result is Negative.  Fact Sheet for Patients:  BloggerCourse.com  Fact Sheet for Healthcare Providers:  SeriousBroker.it  This test is no t yet approved or cleared by the [place] FDA and  has been authorized for detection and/or diagnosis of SARS-CoV-2 by FDA under an Emergency Use Authorization (EUA). This EUA will remain  in effect (meaning this test can be used) for the duration of the COVID-19 declaration under Section 564(b)(1) of the Act, 21 U.S.C.section 360bbb-3(b)(1), unless the authorization is terminated  or revoked sooner.       Influenza A by PCR NEGATIVE NEGATIVE Final   Influenza B by PCR NEGATIVE NEGATIVE Final    Comment: (NOTE) The Xpert Xpress SARS-CoV-2/FLU/RSV plus assay is intended as an aid in the diagnosis of influenza from Nasopharyngeal swab specimens and should not be used as a sole basis for treatment. Nasal washings and aspirates are unacceptable for  Xpert Xpress SARS-CoV-2/FLU/RSV testing.  Fact Sheet for Patients: BloggerCourse.com  Fact Sheet for Healthcare Providers: SeriousBroker.it  This test is not yet approved or cleared by the [place] FDA and has been authorized for detection and/or diagnosis of SARS-CoV-2 by FDA under an Emergency Use Authorization (EUA). This EUA will remain in effect (meaning this test can be used) for the duration of the COVID-19 declaration under Section 564(b)(1) of the Act, 21 U.S.C. section 360bbb-3(b)(1), unless the authorization is terminated or revoked.     Resp Syncytial Virus by PCR NEGATIVE NEGATIVE Final    Comment: (NOTE) Fact Sheet for Patients: BloggerCourse.com  Fact Sheet for Healthcare Providers: SeriousBroker.it  This test is not yet approved or cleared by the [place] FDA and has been authorized for detection and/or diagnosis of SARS-CoV-2 by FDA under an Emergency Use Authorization (EUA). This EUA will remain in effect (  meaning this test can be used) for the duration of the COVID-19 declaration under Section 564(b)(1) of the Act, 21 U.S.C. section 360bbb-3(b)(1), unless the authorization is terminated or revoked.  Performed at [facility], [address] [place], [place] [address]   Respiratory (~20 pathogens) panel by PCR     Status: None   Collection Time: 07/23/22  2:39 AM   Specimen: Nasopharyngeal Swab; Respiratory  Result Value Ref Range Status   Adenovirus NOT DETECTED NOT DETECTED Final   Coronavirus 229E NOT DETECTED NOT DETECTED Final    Comment: (NOTE) The Coronavirus on the Respiratory Panel, DOES NOT test for the novel  Coronavirus (2019 nCoV)    Coronavirus HKU1 NOT DETECTED NOT DETECTED Final   Coronavirus NL63 NOT DETECTED NOT DETECTED Final   Coronavirus OC43 NOT DETECTED NOT DETECTED Final   Metapneumovirus NOT DETECTED NOT  DETECTED Final   Rhinovirus / Enterovirus NOT DETECTED NOT DETECTED Final   Influenza A NOT DETECTED NOT DETECTED Final   Influenza B NOT DETECTED NOT DETECTED Final   Parainfluenza Virus 1 NOT DETECTED NOT DETECTED Final   Parainfluenza Virus 2 NOT DETECTED NOT DETECTED Final   Parainfluenza Virus 3 NOT DETECTED NOT DETECTED Final   Parainfluenza Virus 4 NOT DETECTED NOT DETECTED Final   Respiratory Syncytial Virus NOT DETECTED NOT DETECTED Final   Bordetella pertussis NOT DETECTED NOT DETECTED Final   Bordetella Parapertussis NOT DETECTED NOT DETECTED Final   Chlamydophila pneumoniae NOT DETECTED NOT DETECTED Final   Mycoplasma pneumoniae NOT DETECTED NOT DETECTED Final    Comment: Performed at [facility], [address] [place], [place] [address]     Labs: Basic Metabolic Panel: Recent Labs  Lab 07/22/22 2031 07/25/22 0358  NA 137 136  K 3.7 3.7  CL 102 101  CO2 28 28  GLUCOSE 115* 138*  BUN 15 22  CREATININE 0.93 0.79  CALCIUM 8.6* 8.5*  MG  --  2.4   Liver Function Tests: Recent Labs  Lab 07/22/22 2031  AST 34  ALT 21  ALKPHOS 82  BILITOT 0.2*  PROT 8.2*  ALBUMIN 3.3*   CBC: Recent Labs  Lab 07/22/22 2031  WBC 6.4  NEUTROABS 3.4  HGB 11.6*  HCT 37.2  MCV 87.7  PLT 295   CBG: No results for input(s): "GLUCAP" in the last 168 hours. Hgb A1c No results for input(s): "HGBA1C" in the last 72 hours. Lipid Profile No results for input(s): "CHOL", "HDL", "LDLCALC", "TRIG", "CHOLHDL", "LDLDIRECT" in the last 72 hours. Thyroid function studies No results for input(s): "TSH", "T4TOTAL", "T3FREE", "THYROIDAB" in the last 72 hours.  Invalid input(s): "FREET3" Urinalysis No results found for: "COLORURINE", "APPEARANCEUR", "LABSPEC", "PHURINE", "GLUCOSEU", "HGBUR", "BILIRUBINUR", "KETONESUR", "PROTEINUR", "UROBILINOGEN", "NITRITE", "LEUKOCYTESUR"  FURTHER DISCHARGE INSTRUCTIONS:   Get Medicines reviewed and adjusted: Please take all your  medications with you for your next visit with your Primary MD   Laboratory/radiological data: Please request your Primary MD to go over all hospital tests and procedure/radiological results at the follow up, please ask your Primary MD to get all Hospital records sent to his/her office.   In some cases, they will be blood work, cultures and biopsy results pending at the time of your discharge. Please request that your primary care M.D. goes through all the records of your hospital data and follows up on these results.   Also Note the following: If you experience worsening of your admission symptoms, develop shortness of breath, life threatening  emergency, suicidal or homicidal thoughts you must seek medical attention immediately by calling 911 or calling your MD immediately  if symptoms less severe.   You must read complete instructions/literature along with all the possible adverse reactions/side effects for all the Medicines you take and that have been prescribed to you. Take any new Medicines after you have completely understood and accpet all the possible adverse reactions/side effects.    Do not drive when taking Pain medications or sleeping medications (Benzodaizepines)   Do not take more than prescribed Pain, Sleep and Anxiety Medications. It is not advisable to combine anxiety,sleep and pain medications without talking with your primary care practitioner   Special Instructions: If you have smoked or chewed Tobacco  in the last 2 yrs please stop smoking, stop any regular Alcohol  and or any Recreational drug use.   Wear Seat belts while driving.   Please note: You were cared for by a hospitalist during your hospital stay. Once you are discharged, your primary care physician will handle any further medical issues. Please note that NO REFILLS for any discharge medications will be authorized once you are discharged, as it is imperative that you return to your primary care physician (or  establish a relationship with a primary care physician if you do not have one) for your post hospital discharge needs so that they can reassess your need for medications and monitor your lab values.  Time coordinating discharge: 35 minutes  SIGNED:  Pamella Pert, MD, PhD 07/26/2022, 8:36 AM

## 2022-07-26 NOTE — Plan of Care (Signed)

## 2022-08-05 NOTE — Progress Notes (Signed)
Remote pacemaker transmission.   

## 2022-08-29 ENCOUNTER — Encounter: Payer: Self-pay | Admitting: Internal Medicine

## 2022-08-29 ENCOUNTER — Telehealth: Payer: Self-pay | Admitting: Internal Medicine

## 2022-08-29 ENCOUNTER — Ambulatory Visit: Payer: Medicare HMO | Admitting: Internal Medicine

## 2022-08-29 VITALS — BP 124/78 | HR 76 | Temp 97.5°F | Ht 66.0 in | Wt 300.4 lb

## 2022-08-29 DIAGNOSIS — J4550 Severe persistent asthma, uncomplicated: Secondary | ICD-10-CM | POA: Diagnosis not present

## 2022-08-29 DIAGNOSIS — R600 Localized edema: Secondary | ICD-10-CM

## 2022-08-29 LAB — BASIC METABOLIC PANEL
BUN: 13 mg/dL (ref 6–23)
CO2: 30 mEq/L (ref 19–32)
Calcium: 8.9 mg/dL (ref 8.4–10.5)
Chloride: 102 mEq/L (ref 96–112)
Creatinine, Ser: 0.81 mg/dL (ref 0.40–1.20)
GFR: 69.49 mL/min (ref >60.00)
Glucose, Bld: 107 mg/dL — ABNORMAL HIGH (ref 70–99)
Potassium: 4.3 mEq/L (ref 3.5–5.1)
Sodium: 139 mEq/L (ref 135–145)

## 2022-08-29 LAB — BRAIN NATRIURETIC PEPTIDE: Pro B Natriuretic peptide (BNP): 62 pg/mL (ref 0.0–100.0)

## 2022-08-29 MED ORDER — FLUTICASONE-SALMETEROL 230-21 MCG/ACT IN AERO: 2.0000 | INHALATION_SPRAY | Freq: Two times a day (BID) | RESPIRATORY_TRACT | 12 refills | Status: DC

## 2022-08-29 MED ORDER — SPIRIVA RESPIMAT 1.25 MCG/ACT IN AERS: 1.0000 | INHALATION_SPRAY | Freq: Every day | RESPIRATORY_TRACT | 11 refills | Status: DC

## 2022-08-29 MED ORDER — ALBUTEROL SULFATE (2.5 MG/3ML) 0.083% IN NEBU: 2.5000 mg | INHALATION_SOLUTION | Freq: Four times a day (QID) | RESPIRATORY_TRACT | 12 refills | Status: DC | PRN

## 2022-08-29 MED ORDER — TORSEMIDE 10 MG PO TABS: 10.0000 mg | ORAL_TABLET | Freq: Every day | ORAL | 2 refills | Status: DC

## 2022-08-29 NOTE — Telephone Encounter (Signed)
Called patient to let her know labs were normal. I only want her to take the torsemide for about a week - BNP was normal. Encouraged pcp follow up

## 2022-08-29 NOTE — Progress Notes (Signed)
Autumn Young    [id number]    02-Nov-1945  Primary Care Physician:Sagardia, Eilleen Kempf, MD  Referring Physician: No referring provider defined for this encounter. Reason for Consultation: shortness of breath Date of Consultation: 08/29/2022  Chief complaint:   Chief Complaint  Patient presents with   Follow-up    sob, pt states she has fluid in her ankles and legs. For about a week. Wheezing, cough      HPI: Autumn Young is a 77 y.o. woman who presents for new patient evaluation for copd and shortness of breath. Hospitalized in April 2024.   Diagnosed with asthma in her 98s She has severe OSA and does not wear CPAP because she doesn't have the equipment and supplies. PSG in 2017. She gets claustrophobic with mask.  No childhood respiratory disease. Respiratory problems started in her 67s.  Has seen pulmonary at [facility] in the past but not since 2022.  Albuterol nebulizer morning and night.   Current Regimen: Breo 200 and spiriva Asthma Triggers: smoke, perfumes, cleaning supplies, strong smells,  Exacerbations in the last year: 3-4 times/year.  History of hospitalization or intubation: yes 1-2 times/year. Has been in ICU but never intubated.  Allergy Testing: GERD: yes on protonix BID.  Allergic Rhinitis: yes on montelukast ACT:  Asthma Control Test ACT Total Score  08/29/2022  8:30 AM 15   FeNO:  Social history:  Occupation: Exposures: Smoking history: smoked for 2 years in her 57s. Then quit.   Social History   Occupational History   Not on file  Tobacco Use   Smoking status: Former    Packs/day: 3.00    Years: 2.00    Additional pack years: 0.00    Total pack years: 6.00    Types: Cigarettes    Quit date: 37    Years since quitting: 39.4   Smokeless tobacco: Never  Vaping Use   Vaping Use: Never used  Substance and Sexual Activity   Alcohol use: No   Drug use: No   Sexual activity: Not on file    Relevant family history:  Family  History  Problem Relation Age of Onset   Cancer Mother        Lung, cervix, mouth   Hypertension Father    Cancer Sister        Colon   Diabetes Sister    Hypertension Sister    Heart disease Sister    Cancer Brother    Hypertension Brother     Past Medical History:  Diagnosis Date   Asthma    COPD (chronic obstructive pulmonary disease) (HCC)    GERD (gastroesophageal reflux disease)    Hypertension    Stroke [facility])     Past Surgical History:  Procedure Laterality Date   PACEMAKER IMPLANT N/A 09/23/2021   Procedure: PACEMAKER IMPLANT;  Surgeon: Lanier Prude, MD;  Location: [facility] INVASIVE CV LAB;  Service: Cardiovascular;  Laterality: N/A;   TUBAL LIGATION       Physical Exam: Blood pressure 124/78, pulse 76, temperature (!) 97.5 F (36.4 C), temperature source Oral, height 5\' 6"  (1.676 m), weight (!) 300 lb 6.4 oz (136.3 kg), SpO2 98 %. Gen:      No acute distress, frequent coughing ENT:  mild cobblestoning, mallampati IV, no nasal polyps, mucus membranes moist Lungs:    Diminished, No increased respiratory effort, symmetric chest wall excursion, clear to auscultation bilaterally, no wheezes or crackles CV:  Regular rate and rhythm; no murmurs, rubs, or gallops.  1+ pedal edema Abd:      + bowel sounds; soft, non-tender; no distension MSK: no acute synovitis of DIP or PIP joints, no mechanics hands.  Skin:      Warm and dry; no rashes Neuro: normal speech, no focal facial asymmetry Psych: alert and oriented x3, normal mood and affect   Data Reviewed/Medical Decision Making:  Independent interpretation of tests: Imaging:  Review of patient's CTPE study April 2024 images revealed negative forPE, does have chronic bronchitis. The patient's images have been independently reviewed by me.    PSG - 2017 IMPRESSION:   1. The patient was recently documented to have sleep apnea (AHI = 33; [facility]  PSG 05/11/2015). In this present PAP titration study, CPAP 14 cm H20  with  expiratory pressure relief (EPR) of 3 corrected respiration.  2. Episodes of Nocturnal coughing noted.    RECOMMENDATIONS:   1. CPAP 14 with EPR (expiratory pressure relief) =3. Heated humidity for  nasal dryness. Mask: [employer]: SIMPLUS SIZE MED FULL FACE MASK.  2. The patient should be questioned about potential etiologies of  Nocturnal coughing such as GERD, asthma, post-nasal drip, or others.   PFTs:  Labs:  Lab Results  Component Value Date   NA 136 07/25/2022   K 3.7 07/25/2022   CO2 28 07/25/2022   GLUCOSE 138 (H) 07/25/2022   BUN 22 07/25/2022   CREATININE 0.79 07/25/2022   CALCIUM 8.5 (L) 07/25/2022   EGFR 62 09/12/2021   GFRNONAA >60 07/25/2022   Lab Results  Component Value Date   WBC 6.4 07/22/2022   HGB 11.6 (L) 07/22/2022   HCT 37.2 07/22/2022   MCV 87.7 07/22/2022   PLT 295 07/22/2022      Immunization status:  Immunization History  Administered Date(s) Administered   Influenza-Unspecified 05/28/2015   Pneumococcal Conjugate-13 08/21/2014   Pneumococcal Polysaccharide-23 06/20/2012   Tdap 09/17/2017     I reviewed prior external note(s) from pcp, pulmonary, hospital records, cardiology  I reviewed the result(s) of the labs and imaging as noted above.   I have ordered labs, echo   Assessment:   Severe persistent Asthma, not well controlled  Lower extremity edema Suspected acute on chronic HFpEF Severe OSA - untreated leading to Suspected chronic hypercapnia Syncope s/p PPM  Plan/Recommendations: For asthma: Stop Breo inhaler. Switch to Advair 2 puffs twice a day, gargle after use Keep taking spiriva Keep taking albuterol inhaler Will need to get pfts at some point.   For swelling in legs Ultrasound of your heart - echocardiogram Blood work today - bmet and bnp Start taking torsemide which is a water pill - 1 pill once a day Please call and make an appointment with your PCP asap.    Keep taking acid reflux  medication  Keep taking montelukast for allergies.  We will see if we can prescribe a new cpap machine for you. If not we may need to have you repeat a cpap titration study.    Return in about 3 months (around 11/29/2022).  Durel Salts, MD Pulmonary and Critical Care Medicine [facility] Office:[phone]  CC: No ref. provider found

## 2022-08-29 NOTE — Patient Instructions (Addendum)
Please schedule follow up scheduled with myself in 3 months.  If my schedule is not open yet, we will contact you with a reminder closer to that time. Please call [phone] if you haven't heard from [place] a month before.   Before your next visit I would like you to have: Ultrasound of your heart - echocardiogram Blood work today - bmet and bnp Start taking torsemide which is a water pill - 1 pill once a day Please call and make an appointment with your PCP asap.   Dr. Alvy Bimler at [phone]   [address] [place], [place] [address]  For asthma: Stop Breo inhaler. Switch to Advair 2 puffs twice a day, gargle after use Keep taking spiriva Keep taking albuterol inhaler  Keep taking acid reflux medication  Keep taking montelukast for allergies.  We will see if we can prescribe a new cpap machine for you. If not we may need to have you repeat a sleep study.

## 2022-09-18 ENCOUNTER — Ambulatory Visit (HOSPITAL_BASED_OUTPATIENT_CLINIC_OR_DEPARTMENT_OTHER)
Admission: RE | Admit: 2022-09-18 | Discharge: 2022-09-18 | Disposition: A | Discharge status: Home / Self Care | Payer: Medicare HMO | Source: Ambulatory Visit | Attending: Internal Medicine | Admitting: Internal Medicine

## 2022-09-18 DIAGNOSIS — R600 Localized edema: Secondary | ICD-10-CM | POA: Diagnosis present

## 2022-09-18 LAB — ECHOCARDIOGRAM COMPLETE
AR max vel: 1.65 cm2
AV Area VTI: 1.75 cm2
AV Area mean vel: 1.63 cm2
AV Mean grad: 8 mmHg
AV Peak grad: 14.3 mmHg
Ao pk vel: 1.89 m/s
Area-P 1/2: 2.56 cm2
Calc EF: 62.8 %
S' Lateral: 2.8 cm
Single Plane A2C EF: 69 %
Single Plane A4C EF: 57.6 %

## 2022-09-23 ENCOUNTER — Ambulatory Visit (INDEPENDENT_AMBULATORY_CARE_PROVIDER_SITE_OTHER): Payer: Medicare HMO

## 2022-09-23 DIAGNOSIS — Z8673 Personal history of transient ischemic attack (TIA), and cerebral infarction without residual deficits: Secondary | ICD-10-CM

## 2022-09-23 DIAGNOSIS — I442 Atrioventricular block, complete: Secondary | ICD-10-CM

## 2022-09-25 ENCOUNTER — Ambulatory Visit (INDEPENDENT_AMBULATORY_CARE_PROVIDER_SITE_OTHER): Payer: Medicare HMO | Admitting: Emergency Medicine

## 2022-09-25 ENCOUNTER — Encounter: Payer: Self-pay | Admitting: Emergency Medicine

## 2022-09-25 VITALS — BP 138/86 | HR 60 | Temp 97.9°F | Ht 66.0 in | Wt 300.5 lb

## 2022-09-25 DIAGNOSIS — R600 Localized edema: Secondary | ICD-10-CM

## 2022-09-25 DIAGNOSIS — E785 Hyperlipidemia, unspecified: Secondary | ICD-10-CM

## 2022-09-25 DIAGNOSIS — I10 Essential (primary) hypertension: Secondary | ICD-10-CM

## 2022-09-25 DIAGNOSIS — I5032 Chronic diastolic (congestive) heart failure: Secondary | ICD-10-CM | POA: Diagnosis not present

## 2022-09-25 DIAGNOSIS — G4733 Obstructive sleep apnea (adult) (pediatric): Secondary | ICD-10-CM | POA: Diagnosis not present

## 2022-09-25 DIAGNOSIS — J4489 Other specified chronic obstructive pulmonary disease: Secondary | ICD-10-CM

## 2022-09-25 DIAGNOSIS — Z8673 Personal history of transient ischemic attack (TIA), and cerebral infarction without residual deficits: Secondary | ICD-10-CM

## 2022-09-25 MED ORDER — TORSEMIDE 20 MG PO TABS
20.0000 mg | ORAL_TABLET | Freq: Every day | ORAL | 1 refills | Status: AC
Start: 2022-09-25 — End: ?

## 2022-09-25 NOTE — Assessment & Plan Note (Signed)
Multifactorial. Patient has morbid obesity.  Diet and nutrition discussed History of COPD and congestive heart failure Recommend to increase torsemide to 20 mg daily Will follow-up with cardiologist and pulmonologist in the next several weeks

## 2022-09-25 NOTE — Assessment & Plan Note (Signed)
Diet and nutrition discussed.  Advised to decrease amount of daily carbohydrate intake and daily calories and increase amount of plant-based protein in her diet. 

## 2022-09-25 NOTE — Assessment & Plan Note (Signed)
Stable but contributing to peripheral edema secondary to right-sided heart failure Continue Spiriva 1 puff daily and Advair 2 puffs twice daily Use rescue inhaler albuterol as needed

## 2022-09-25 NOTE — Assessment & Plan Note (Signed)
Diet and nutrition discussed Allergic to statins.

## 2022-09-25 NOTE — Assessment & Plan Note (Signed)
Retaining fluid.  Clear lungs. Has some dyspnea on exertion.  No anginal episodes Recommend to increase torsemide to 20 mg daily

## 2022-09-25 NOTE — Assessment & Plan Note (Signed)
Stable on CPAP treatment. 

## 2022-09-25 NOTE — Patient Instructions (Signed)
Increase Demadex to 20 mg daily Continue all other medications  Peripheral Edema  Peripheral edema is swelling that is caused by a buildup of fluid. Peripheral edema most often affects the lower legs, ankles, and feet. It can also develop in the arms, hands, and face. The area of the body that has peripheral edema will look swollen. It may also feel heavy or warm. Your clothes may start to feel tight. Pressing on the area may make a temporary dent in your skin (pitting edema). You may not be able to move your swollen arm or leg as much as usual. There are many causes of peripheral edema. It can happen because of a complication of other conditions such as heart failure, kidney disease, or a problem with your circulation. It also can be a side effect of certain medicines or happen because of an infection. It often happens to women during pregnancy. Sometimes, the cause is not known. Follow these instructions at home: Managing pain, stiffness, and swelling  Raise (elevate) your legs while you are sitting or lying down. Move around often to prevent stiffness and to reduce swelling. Do not sit or stand for long periods of time. Do not wear tight clothing. Do not wear garters on your upper legs. Exercise your legs to get your circulation going. This helps to move the fluid back into your blood vessels, and it may help the swelling go down. Wear compression stockings as told by your health care provider. These stockings help to prevent blood clots and reduce swelling in your legs. It is important that these are the correct size. These stockings should be prescribed by your doctor to prevent possible injuries. If elastic bandages or wraps are recommended, use them as told by your health care provider. Medicines Take over-the-counter and prescription medicines only as told by your health care provider. Your health care provider may prescribe medicine to help your body get rid of excess water (diuretic).  Take this medicine if you are told to take it. General instructions Eat a low-salt (low-sodium) diet as told by your health care provider. Sometimes, eating less salt may reduce swelling. Pay attention to any changes in your symptoms. Moisturize your skin daily to help prevent skin from cracking and draining. Keep all follow-up visits. This is important. Contact a health care provider if: You have a fever. You have swelling in only one leg. You have increased swelling, redness, or pain in one or both of your legs. You have drainage or sores at the area where you have edema. Get help right away if: You have edema that starts suddenly or is getting worse, especially if you are pregnant or have a medical condition. You develop shortness of breath, especially when you are lying down. You have pain in your chest or abdomen. You feel weak. You feel like you will faint. These symptoms may be an emergency. Get help right away. Call 911. Do not wait to see if the symptoms will go away. Do not drive yourself to the hospital. Summary Peripheral edema is swelling that is caused by a buildup of fluid. Peripheral edema most often affects the lower legs, ankles, and feet. Move around often to prevent stiffness and to reduce swelling. Do not sit or stand for long periods of time. Pay attention to any changes in your symptoms. Contact a health care provider if you have edema that starts suddenly or is getting worse, especially if you are pregnant or have a medical condition. Get help right  away if you develop shortness of breath, especially when lying down. This information is not intended to replace advice given to you by your health care provider. Make sure you discuss any questions you have with your health care provider. Document Revised: 11/19/2020 Document Reviewed: 11/19/2020 Elsevier Patient Education  2024 [employer].

## 2022-09-25 NOTE — Assessment & Plan Note (Signed)
Clinically stable.  Secondary prevention of strokes discussed Continue daily baby aspirin

## 2022-09-25 NOTE — Assessment & Plan Note (Signed)
BP Readings from Last 3 Encounters:  09/25/22 138/86  08/29/22 124/78  07/26/22 (!) 158/78  Well-controlled hypertension Continue olmesartan 20 mg and propranolol 20 mg twice a day Cardiovascular risks associated with hypertension discussed Dietary approaches to stop hypertension discussed

## 2022-09-25 NOTE — Progress Notes (Signed)
Autumn Young 77 y.o.   Chief Complaint  Patient presents with   Edema    Patient states she has been having some swelling in her feet and legs x 2 weeks ago.     HISTORY OF PRESENT ILLNESS: This is a 77 y.o. female complaining of bilateral lower leg edema Last and only office visit with me May 2023.  Since she had syncopal episodes that required permanent pacemaker insertion. Cardiologist office visits reviewed. Was also diagnosed with chronic heart failure with preserved ejection fraction.  Most recent echocardiogram Wt Readings from Last 3 Encounters:  09/25/22 (!) 300 lb 8 oz (136.3 kg)  08/29/22 (!) 300 lb 6.4 oz (136.3 kg)  07/22/22 294 lb 15.6 oz (133.8 kg)     HPI   Prior to Admission medications   Medication Sig Start Date End Date Taking? Authorizing Provider  albuterol (PROVENTIL HFA) 108 (90 Base) MCG/ACT inhaler Inhale 2 puffs into the lungs every 6 (six) hours as needed for wheezing or shortness of breath. 07/26/22  Yes Gherghe, Daylene Katayama, MD  albuterol (PROVENTIL) (2.5 MG/3ML) 0.083% nebulizer solution Take 3 mLs (2.5 mg total) by nebulization every 6 (six) hours as needed for wheezing or shortness of breath. 08/29/22  Yes Charlott Holler, MD  ascorbic acid (VITAMIN C) 1000 MG tablet Take 1,000 mg by mouth daily. 05/14/18  Yes [provider]  aspirin EC 81 MG tablet Take 1 tablet (81 mg total) by mouth daily. Swallow whole. 07/26/22 07/26/23 Yes Leatha Gilding, MD  Cholecalciferol 25 MCG (1000 UT) tablet Take 1,000 Units by mouth in the morning.   Yes [provider]  Cyanocobalamin (VITAMIN B-12 PO) Take 6 drops by mouth in the morning.   Yes [provider]  fluticasone-salmeterol (ADVAIR HFA) 230-21 MCG/ACT inhaler Inhale 2 puffs into the lungs 2 (two) times daily. 08/29/22  Yes Charlott Holler, MD  guaiFENesin-dextromethorphan [facility] DM) 100-10 MG/5ML syrup Take 5 mLs by mouth every 4 (four) hours as needed for cough. 07/26/22  Yes  Gherghe, Daylene Katayama, MD  montelukast (SINGULAIR) 10 MG tablet Take 10 mg by mouth at bedtime. 03/26/21  Yes [provider]  olmesartan (BENICAR) 20 MG tablet Take 1 tablet (20 mg total) by mouth at bedtime. 07/26/22  Yes Gherghe, Daylene Katayama, MD  Omega-3 Fatty Acids (OMEGA-3 PO) Take 1 capsule by mouth in the morning.   Yes [provider]  pantoprazole (PROTONIX) 40 MG tablet Take 1 tablet (40 mg total) by mouth daily before breakfast. 07/26/22  Yes Gherghe, Daylene Katayama, MD  propranolol (INDERAL) 20 MG tablet Take 1 tablet (20 mg total) by mouth 2 (two) times daily. 05/13/22  Yes Vanetta Mulders, MD  PYRIDOXINE HCL PO Take 2 capsules by mouth in the morning.   Yes [provider]  Red Yeast Rice Extract (RED YEAST RICE PO) Take 2 tablets by mouth daily.   Yes [provider]  SPIRIVA RESPIMAT 1.25 MCG/ACT AERS Inhale 1 puff into the lungs daily. 08/29/22  Yes Charlott Holler, MD  torsemide (DEMADEX) 10 MG tablet Take 1 tablet (10 mg total) by mouth daily. 08/29/22  Yes Charlott Holler, MD  acetaminophen (TYLENOL) 500 MG tablet Take 500 mg by mouth every 6 (six) hours as needed (pain). Patient not taking: Reported on 08/29/2022    [provider]  Carboxymethylcellulose Sodium (ARTIFICIAL TEARS OP) Place 1 drop into both eyes at bedtime as needed (dryness). 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   Mild intermittent asthma without complication 11/19/2014   Essential hypertension 11/18/2014    Past Medical History:  Diagnosis Date   Asthma    COPD (chronic obstructive pulmonary disease) (HCC)    GERD (gastroesophageal reflux disease)    Hypertension    Stroke [facility])     Past Surgical History:  Procedure Laterality Date   PACEMAKER IMPLANT N/A 09/23/2021   Procedure: PACEMAKER IMPLANT;  Surgeon: Lanier Prude, MD;  Location: [facility] INVASIVE CV LAB;  Service: Cardiovascular;  Laterality: N/A;   TUBAL LIGATION      Social History   Socioeconomic History   Marital status: Divorced    Spouse name: Not on file   Number of children: Not on file   Years of education: Not on file   Highest education level: Not on file  Occupational History   Not on file  Tobacco Use   Smoking status: Former    Packs/day: 3.00    Years: 2.00    Additional pack years: 0.00    Total pack years: 6.00    Types: Cigarettes    Quit date: 48    Years since quitting: 39.5   Smokeless tobacco: Never  Vaping Use   Vaping Use: Never used  Substance and Sexual Activity   Alcohol use: No   Drug use: No   Sexual activity: Not on file  Other Topics Concern   Not on file  Social History Narrative   Lives with a friend.  3 children with one deceased.  Right handed. Husband has passed away   Social Determinants of Health   Financial Resource Strain: Not on file  Food Insecurity: No Food Insecurity (07/23/2022)   Hunger Vital Sign    Worried About Running Out of Food in the Last Year: Never true    Ran Out of Food in the Last Year: Never true  Transportation Needs: No Transportation Needs (07/23/2022)   PRAPARE - Administrator, Civil Service (Medical): No    Lack of Transportation (Non-Medical): No  Physical Activity: Not on file  Stress: Not on file  Social Connections: Not on file  Intimate Partner Violence: Not At Risk (07/23/2022)   Humiliation,  Afraid, Rape, and Kick questionnaire    Fear of Current or Ex-Partner: No    Emotionally Abused: No    Physically Abused: No    Sexually Abused: No    Family History  Problem Relation Age of Onset   Cancer Mother        Lung, cervix, mouth   Hypertension Father    Cancer Sister        Colon   Diabetes Sister    Hypertension Sister    Heart disease Sister    Cancer Brother    Hypertension Brother      Review of Systems  Constitutional: Negative.  Negative for chills and fever.  HENT: Negative.  Negative for congestion and  sore throat.   Respiratory:  Positive for shortness of breath (Dyspnea on exertion). Negative for cough.   Cardiovascular:  Positive for leg swelling. Negative for chest pain and palpitations.  Gastrointestinal:  Negative for abdominal pain, diarrhea, nausea and vomiting.  Genitourinary: Negative.  Negative for dysuria and hematuria.  Skin: Negative.  Negative for rash.  Neurological: Negative.  Negative for dizziness and headaches.  All other systems reviewed and are negative.   Vitals:   09/25/22 1541  BP: 138/86  Pulse: 60  Temp: 97.9 F (36.6 C)  SpO2: 97%    Physical Exam Constitutional:      Appearance: Normal appearance. She is obese.  HENT:     Head: Normocephalic.     Mouth/Throat:     Mouth: Mucous membranes are moist.     Pharynx: Oropharynx is clear.  Eyes:     Extraocular Movements: Extraocular movements intact.     Pupils: Pupils are equal, round, and reactive to light.  Cardiovascular:     Rate and Rhythm: Normal rate and regular rhythm.     Pulses: Normal pulses.     Heart sounds: Normal heart sounds.  Pulmonary:     Effort: Pulmonary effort is normal.     Breath sounds: Normal breath sounds.  Musculoskeletal:     Cervical back: No tenderness.     Right lower leg: Edema present.     Left lower leg: Edema present.  Lymphadenopathy:     Cervical: No cervical adenopathy.  Skin:    General: Skin is warm and dry.   Neurological:     General: No focal deficit present.     Mental Status: She is alert and oriented to person, place, and time.  Psychiatric:        Mood and Affect: Mood normal.        Behavior: Behavior normal.      ASSESSMENT & PLAN: A total of 44 minutes was spent with the patient and counseling/coordination of care regarding preparing for this visit, review of most recent office visit notes, review of most recent cardiologist office visit notes, review of multiple chronic medical conditions under management, review of all medications and changes made, review of most recent blood work results, education on nutrition, management of peripheral edema, prognosis, documentation, need for follow-up.  Problem List Items Addressed This Visit       Cardiovascular and Mediastinum   Essential hypertension (Chronic)    BP Readings from Last 3 Encounters:  09/25/22 138/86  08/29/22 124/78  07/26/22 (!) 158/78  Well-controlled hypertension Continue olmesartan 20 mg and propranolol 20 mg twice a day Cardiovascular risks associated with hypertension discussed Dietary approaches to stop hypertension discussed       Relevant Medications   torsemide (DEMADEX) 20 MG tablet   Chronic heart failure with preserved ejection fraction (HCC)    Retaining fluid.  Clear lungs. Has some dyspnea on exertion.  No anginal episodes Recommend to increase torsemide to 20 mg daily      Relevant Medications   torsemide (DEMADEX) 20 MG tablet     Respiratory   OSA (obstructive sleep apnea) (Chronic)    Stable on CPAP treatment      COPD (chronic obstructive pulmonary disease) with chronic bronchitis    Stable but contributing to peripheral edema secondary to right-sided heart failure Continue Spiriva 1 puff daily and Advair 2 puffs twice daily Use rescue inhaler albuterol as needed        Other   History of  CVA (cerebrovascular accident)    Clinically stable.  Secondary prevention of strokes  discussed Continue daily baby aspirin      Dyslipidemia    Diet and nutrition discussed Allergic to statins.      Peripheral edema - Primary    Multifactorial. Patient has morbid obesity.  Diet and nutrition discussed History of COPD and congestive heart failure Recommend to increase torsemide to 20 mg daily Will follow-up with cardiologist and pulmonologist in the next several weeks      Relevant Medications   torsemide (DEMADEX) 20 MG tablet   Morbid obesity (HCC)    Diet and nutrition discussed Advised to decrease amount of daily carbohydrate intake and daily calories and increase amount of plant-based protein in her diet      Patient Instructions  Increase Demadex to 20 mg daily Continue all other medications  Peripheral Edema  Peripheral edema is swelling that is caused by a buildup of fluid. Peripheral edema most often affects the lower legs, ankles, and feet. It can also develop in the arms, hands, and face. The area of the body that has peripheral edema will look swollen. It may also feel heavy or warm. Your clothes may start to feel tight. Pressing on the area may make a temporary dent in your skin (pitting edema). You may not be able to move your swollen arm or leg as much as usual. There are many causes of peripheral edema. It can happen because of a complication of other conditions such as heart failure, kidney disease, or a problem with your circulation. It also can be a side effect of certain medicines or happen because of an infection. It often happens to women during pregnancy. Sometimes, the cause is not known. Follow these instructions at home: Managing pain, stiffness, and swelling  Raise (elevate) your legs while you are sitting or lying down. Move around often to prevent stiffness and to reduce swelling. Do not sit or stand for long periods of time. Do not wear tight clothing. Do not wear garters on your upper legs. Exercise your legs to get your  circulation going. This helps to move the fluid back into your blood vessels, and it may help the swelling go down. Wear compression stockings as told by your health care provider. These stockings help to prevent blood clots and reduce swelling in your legs. It is important that these are the correct size. These stockings should be prescribed by your doctor to prevent possible injuries. If elastic bandages or wraps are recommended, use them as told by your health care provider. Medicines Take over-the-counter and prescription medicines only as told by your health care provider. Your health care provider may prescribe medicine to help your body get rid of excess water (diuretic). Take this medicine if you are told to take it. General instructions Eat a low-salt (low-sodium) diet as told by your health care provider. Sometimes, eating less salt may reduce swelling. Pay attention to any changes in your symptoms. Moisturize your skin daily to help prevent skin from cracking and draining. Keep all follow-up visits. This is important. Contact a health care provider if: You have a fever. You have swelling in only one leg. You have increased swelling, redness, or pain in one or both of your legs. You have drainage or sores at the area where you have edema. Get help right away if: You have edema that starts suddenly or is getting worse, especially if you are pregnant or have a medical condition. You develop  shortness of breath, especially when you are lying down. You have pain in your chest or abdomen. You feel weak. You feel like you will faint. These symptoms may be an emergency. Get help right away. Call 911. Do not wait to see if the symptoms will go away. Do not drive yourself to the hospital. Summary Peripheral edema is swelling that is caused by a buildup of fluid. Peripheral edema most often affects the lower legs, ankles, and feet. Move around often to prevent stiffness and to reduce  swelling. Do not sit or stand for long periods of time. Pay attention to any changes in your symptoms. Contact a health care provider if you have edema that starts suddenly or is getting worse, especially if you are pregnant or have a medical condition. Get help right away if you develop shortness of breath, especially when lying down. This information is not intended to replace advice given to you by your health care provider. Make sure you discuss any questions you have with your health care provider. Document Revised: 11/19/2020 Document Reviewed: 11/19/2020 Elsevier Patient Education  2024 [employer]    Edwina Barth, MD [facility]

## 2022-09-29 LAB — CUP PACEART REMOTE DEVICE CHECK
Date Time Interrogation Session: 20240628130126
Implantable Lead Connection Status: 753985
Implantable Lead Connection Status: 753985
Implantable Lead Implant Date: 20230626
Implantable Lead Implant Date: 20230626
Implantable Lead Location: 753859
Implantable Lead Location: 753860
Implantable Lead Model: 377
Implantable Lead Model: 377
Implantable Lead Serial Number: 8000898701
Implantable Lead Serial Number: 8000923303
Implantable Pulse Generator Implant Date: 20230626
Pulse Gen Model: 407145
Pulse Gen Serial Number: 70433047

## 2022-10-13 ENCOUNTER — Encounter: Payer: Self-pay | Admitting: Neurology

## 2022-10-13 ENCOUNTER — Telehealth: Payer: Self-pay | Admitting: Neurology

## 2022-10-13 ENCOUNTER — Ambulatory Visit: Payer: Medicare HMO | Admitting: Neurology

## 2022-10-13 DIAGNOSIS — Z029 Encounter for administrative examinations, unspecified: Secondary | ICD-10-CM

## 2022-10-13 NOTE — Telephone Encounter (Signed)
Patient called this morning to reschedule appointment, patient can't walk and bend her ankles due to swelling in both legs and feet. Patient states that her feet are almost black. Autumn Young

## 2022-10-13 NOTE — Telephone Encounter (Signed)
Pt called an advised that she needs to call her cardiologist or go to the ER if her feet are turing black because that is not normal if the medication she was given is not helping,

## 2022-10-17 NOTE — Progress Notes (Signed)
Remote pacemaker transmission.   

## 2022-11-02 ENCOUNTER — Other Ambulatory Visit: Payer: Self-pay | Admitting: Cardiology

## 2022-11-04 ENCOUNTER — Encounter: Payer: Self-pay | Admitting: Neurology

## 2022-11-04 ENCOUNTER — Ambulatory Visit: Payer: Medicare HMO | Admitting: Neurology

## 2022-11-04 VITALS — BP 187/77 | HR 78 | Ht 66.0 in | Wt 307.2 lb

## 2022-11-04 DIAGNOSIS — G250 Essential tremor: Secondary | ICD-10-CM

## 2022-11-04 NOTE — Patient Instructions (Signed)
Good to see you.  Reduce Propranolol to 1 tablet once a day for 3 days, then stop Propranolol  2. Update me in 2 weeks how you are feeling off the Propranolol, if no change in leg swelling, please contact Dr. Alvy Bimler  3. We will plan to start Primidone in 2 weeks  4. Follow-up in 3 months, call for any changes

## 2022-11-04 NOTE — Progress Notes (Signed)
NEUROLOGY FOLLOW UP OFFICE NOTE  Autumn Young [id number] 09-28-1945  HISTORY OF PRESENT ILLNESS: I had the pleasure of seeing Autumn Young in follow-up in the neurology clinic on 11/04/2022. Her 2 granddaughters are with her today. The patient was last seen 7 months ago for Essential Tremor. 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 No further syncopal episodes since last visit. On her last visit, Propranolol dose increased to 20mg  in AM, 40mg  in PM for tremor but she states she has been taking 20mg  BID. She states that it does help with tremor, she is able to write now, however she feels that this is contributing to her pedal edema. When she forgets a dose, her legs "start going down." She states the Torsemide has not helped, she is taking it BID. Her legs are painful due to being stretched from the edema. She reports some shortness of breath when walking from inside to outside the building. Elevating her legs help. She denies any falls. She has had headaches since the past week with pain in the nape. BP today 186/76. She states BP is elevated due to family stress from family members with illness. She denies any dizziness, vision changes. Fingers are numb all the time (unchanged).    History on Initial Assessment 07/24/2021: This is a pleasant 77 year old right-handed woman with a history of hypertension, CAD, CKD, hyperlipidemia, COPD, obesity, presenting for evaluation of syncope and tremor. The first syncopal episode occurred on 06/06/21, she was at home doing a number game with her granddaughter when all of a sudden she had had "hard shaking" different from her typical tremor, she could see her body shaking real fast and could her hear her granddaughter saying she was scared and she could not see her grandmother's eyes. She called to her housemate to call 911, she continued to see her body shaking and then vomited. She felt like she was out  of breath, "like I did not have air," and thinks she lost consciousness, feeling herself go away and when she came back she could still see her body shaking. No diaphoresis, tongue bite, or incontinence. Her granddaughter got her the phone and the patient dialed 911 and spoke to EMS. She had a headache and when she lay her head back, head was swimming. When they arrived, she was still vomiting but the hard shaking was gone. She had a spinning sensation when she lay on her back. She was "bobbing around" when they tried to stand her up, she was diffusely weak. No tongue bite or incontinence. She had diarrhea in the ER and while in the hospital, she reported feeling ill the past month with intermittent nausea and vomiting, however today states that she had been feeling fine that day and had eaten breakfast earlier in the morning. She had a brain MRI without contrast which I personally reviewed, no acute changes. CTA head and neck no significant stenosis. Her routine EEG was normal. Echocardiogram showed an EF of 60-65% with grade II diastolic dysfunction. She had bradycardia with second degree AV block on telemetry. During her admission, she reported the head tremor had been ongoing for 3 years and had been worsening, so she was referred to Neurology.   She reports having another milder episode 2 weeks ago while her other granddaughter was sitting on her lap. She recalls that she started coughing then passed out for a minute, when she came to, her granddaughter  was slapping her face saying she could not see her eyes. She did not have the hard shaking, vomiting, difficulty breathing, dizziness, or fatigue she had that time so she did not seek medical attention. She lives with a housemate and her granddaughter. She has not been told of any staring/unresponsive episodes, she denies any other gaps in time, no olfactory/gustatory hallucinations, focal numbness/tingling/weakness, myoclonic jerks. She has had intermittent  headaches. Since the incident in March, every time she lays down at night, she has vertigo, she turns over or stands up and it resolves, no nausea/vomiting. No diplopia, dysarthria/dysphagia, neck/back pain, constipation, anosmia. She has right shoulder pain. She has noticed irregular palpitations for a time now, she has the heart monitor equipment but has not worn it yet. Head tremors started around 3 years ago. Her right hand is also affected, affecting writing and using utensils. She states her left hand and legs are not affected. Her maternal grandfather, maternal aunt, and son "shook all the time." Her paternal grandfather had shaking, her older sister is beginning to shake a little bit. Her son had seizures in childhood and passed away from a seizure. She denies any significant head injuries, CNS infections.   PAST MEDICAL HISTORY: Past Medical History:  Diagnosis Date   Asthma    COPD (chronic obstructive pulmonary disease) (HCC)    GERD (gastroesophageal reflux disease)    Hypertension    Stroke [facility])     MEDICATIONS: Current Outpatient Medications on File Prior to Visit  Medication Sig Dispense Refill   acetaminophen (TYLENOL) 500 MG tablet Take 500 mg by mouth every 6 (six) hours as needed (pain). (Patient not taking: Reported on 08/29/2022)     albuterol (PROVENTIL HFA) 108 (90 Base) MCG/ACT inhaler Inhale 2 puffs into the lungs every 6 (six) hours as needed for wheezing or shortness of breath. 18 g 0   albuterol (PROVENTIL) (2.5 MG/3ML) 0.083% nebulizer solution Take 3 mLs (2.5 mg total) by nebulization every 6 (six) hours as needed for wheezing or shortness of breath. 75 mL 12   ascorbic acid (VITAMIN C) 1000 MG tablet Take 1,000 mg by mouth daily.     aspirin EC 81 MG tablet Take 1 tablet (81 mg total) by mouth daily. Swallow whole. 150 tablet 2   Carboxymethylcellulose Sodium (ARTIFICIAL TEARS OP) Place 1 drop into both eyes at bedtime as needed (dryness). (Patient not taking:  Reported on 08/29/2022)     Cholecalciferol 25 MCG (1000 UT) tablet Take 1,000 Units by mouth in the morning.     Cyanocobalamin (VITAMIN B-12 PO) Take 6 drops by mouth in the morning.     fluticasone-salmeterol (ADVAIR HFA) 230-21 MCG/ACT inhaler Inhale 2 puffs into the lungs 2 (two) times daily. 1 each 12   guaiFENesin-dextromethorphan (ROBITUSSIN DM) 100-10 MG/5ML syrup Take 5 mLs by mouth every 4 (four) hours as needed for cough. 118 mL 0   montelukast (SINGULAIR) 10 MG tablet Take 10 mg by mouth at bedtime.     olmesartan (BENICAR) 20 MG tablet Take 1 tablet (20 mg total) by mouth at bedtime. 30 tablet 1   Omega-3 Fatty Acids (OMEGA-3 PO) Take 1 capsule by mouth in the morning.     pantoprazole (PROTONIX) 40 MG tablet Take 1 tablet (40 mg total) by mouth daily before breakfast. 30 tablet 1   propranolol (INDERAL) 20 MG tablet TAKE 1 TABLET BY MOUTH TWICE A DAY 180 tablet 1   PYRIDOXINE HCL PO Take 2 capsules by mouth in the  morning.     Red Yeast Rice Extract (RED YEAST RICE PO) Take 2 tablets by mouth daily.     SPIRIVA RESPIMAT 1.25 MCG/ACT AERS Inhale 1 puff into the lungs daily. 1 each 11   torsemide (DEMADEX) 20 MG tablet Take 1 tablet (20 mg total) by mouth daily. 90 tablet 1   No current facility-administered medications on file prior to visit.    ALLERGIES: Allergies  Allergen Reactions   Shellfish Allergy Anaphylaxis   Zocor [Simvastatin] Swelling   Lasix [Furosemide] Swelling   Latex Rash   Penicillins Rash    FAMILY HISTORY: Family History  Problem Relation Age of Onset   Cancer Mother        Lung, cervix, mouth   Hypertension Father    Cancer Sister        Colon   Diabetes Sister    Hypertension Sister    Heart disease Sister    Cancer Brother    Hypertension Brother     SOCIAL HISTORY: Social History   Socioeconomic History   Marital status: Divorced    Spouse name: Not on file   Number of children: Not on file   Years of education: Not on file    Highest education level: Not on file  Occupational History   Not on file  Tobacco Use   Smoking status: Former    Current packs/day: 0.00    Average packs/day: 3.0 packs/day for 2.0 years (6.0 ttl pk-yrs)    Types: Cigarettes    Start date: 37    Quit date: 1985    Years since quitting: 39.6   Smokeless tobacco: Never  Vaping Use   Vaping status: Never Used  Substance and Sexual Activity   Alcohol use: No   Drug use: No   Sexual activity: Not on file  Other Topics Concern   Not on file  Social History Narrative   Lives with a friend.  3 children with one deceased.  Right handed. Husband has passed away   Social Determinants of Health   Financial Resource Strain: Not on file  Food Insecurity: No Food Insecurity (07/23/2022)   Hunger Vital Sign    Worried About Running Out of Food in the Last Year: Never true    Ran Out of Food in the Last Year: Never true  Transportation Needs: No Transportation Needs (07/23/2022)   PRAPARE - Administrator, Civil Service (Medical): No    Lack of Transportation (Non-Medical): No  Physical Activity: Not on file  Stress: Not on file  Social Connections: Not on file  Intimate Partner Violence: Not At Risk (07/23/2022)   Humiliation, Afraid, Rape, and Kick questionnaire    Fear of Current or Ex-Partner: No    Emotionally Abused: No    Physically Abused: No    Sexually Abused: No     PHYSICAL EXAM: Vitals:   11/04/22 1301 11/04/22 1308  BP: (!) 186/76 (!) 187/77  Pulse: 78   SpO2: 95%    General: No acute distress Head:  Normocephalic/atraumatic Skin/Extremities: No rash, +bipedal edema Neurological Exam: alert and awake. No aphasia or dysarthria. Fund of knowledge is appropriate. Attention and concentration are normal.   Cranial nerves: Pupils equal, round. Extraocular movements intact with no nystagmus. Visual fields full.  No facial asymmetry.  Motor: Bulk and tone normal, muscle strength 5/5 throughout with no pronator  drift.   Finger to nose testing intact.  Gait slow and cautious. +side to side head tremor. No  tremor in extremities today.   IMPRESSION: This is a pleasant 77 yo RH woman with a history of hypertension, CAD, CKD, hyperlipidemia, COPD, obesity, who presented for syncope and essential tremor. Syncope found due to complete heart block, she denies any fainting episodes since August 2023. She feels the Propranolol has helped but is contributing to her pedal edema. We discussed stopping Propranolol. She will contact our office in 2 weeks with plans to start Primidone at that point, side effects discussed. If no improvement in pedal edema off Propranolol, she was advised to contact her PCP. Follow-up in 3 months, call for any changes.   Thank you for allowing me to participate in her care.  Please do not hesitate to call for any questions or concerns.    Patrcia Dolly, M.D.   CC: Dr. Alvy Bimler

## 2022-11-13 ENCOUNTER — Telehealth: Payer: Self-pay

## 2022-11-13 NOTE — Telephone Encounter (Signed)
Called pt in regards to AWV appointment, while on the phone patient stated she needed to send a message to PCP.  Patient states she is still experiencing fluid retention and she is unable to take the whole 20mg  tablet of Torsemide due to it causing pain in her pelvic area. She has only being taking half a tablet per day. She wants to know if it is ok to take 1/2 tablet BID? Please call pt and advise

## 2022-11-14 NOTE — Telephone Encounter (Signed)
It is okay to take torsemide 1/2 tablet twice a day.  Schedule a follow-up with Dr. Alvy Bimler. Thanks

## 2022-11-14 NOTE — Telephone Encounter (Signed)
Called patient with provider response. Patient states she will take the medication for a week and see how the medication works . If the swelling doesn't go down she will call the make an appointment to see Dr. Alvy Bimler.Marland KitchenMarland Kitchen

## 2022-11-18 ENCOUNTER — Ambulatory Visit: Payer: Medicare HMO

## 2022-11-18 VITALS — Ht 65.0 in | Wt 307.0 lb

## 2022-11-18 DIAGNOSIS — Z0000 Encounter for general adult medical examination without abnormal findings: Secondary | ICD-10-CM

## 2022-11-18 NOTE — Patient Instructions (Signed)
Autumn Young , Thank you for taking time to come for your Medicare Wellness Visit. I appreciate your ongoing commitment to your health goals. Please review the following plan we discussed and let me know if I can assist you in the future.   Referrals/Orders/Follow-Ups/Clinician Recommendations: No  This is a list of the screening recommended for you and due dates:  Health Maintenance  Topic Date Due   Hepatitis C Screening  Never done   DEXA scan (bone density measurement)  Never done   COVID-19 Vaccine (1 - 2023-24 season) Never done   Flu Shot  10/30/2022   Zoster (Shingles) Vaccine (1 of 2) 12/26/2022*   Medicare Annual Wellness Visit  11/18/2023   DTaP/Tdap/Td vaccine (2 - Td or Tdap) 09/18/2027   Pneumonia Vaccine  Completed   HPV Vaccine  Aged Out  *Topic was postponed. The date shown is not the original due date.    Advanced directives: (Declined) Advance directive discussed with you today. Even though you declined this today, please call our office should you change your mind, and we can give you the proper paperwork for you to fill out.  Next Medicare Annual Wellness Visit scheduled for next year: Yes  Preventive Care 46 Years and Older, Female Preventive care refers to lifestyle choices and visits with your health care provider that can promote health and wellness. What does preventive care include? A yearly physical exam. This is also called an annual well check. Dental exams once or twice a year. Routine eye exams. Ask your health care provider how often you should have your eyes checked. Personal lifestyle choices, including: Daily care of your teeth and gums. Regular physical activity. Eating a healthy diet. Avoiding tobacco and drug use. Limiting alcohol use. Practicing safe sex. Taking low-dose aspirin every day. Taking vitamin and mineral supplements as recommended by your health care provider. What happens during an annual well check? The services and  screenings done by your health care provider during your annual well check will depend on your age, overall health, lifestyle risk factors, and family history of disease. Counseling  Your health care provider may ask you questions about your: Alcohol use. Tobacco use. Drug use. Emotional well-being. Home and relationship well-being. Sexual activity. Eating habits. History of falls. Memory and ability to understand (cognition). Work and work Astronomer. Reproductive health. Screening  You may have the following tests or measurements: Height, weight, and BMI. Blood pressure. Lipid and cholesterol levels. These may be checked every 5 years, or more frequently if you are over 21 years old. Skin check. Lung cancer screening. You may have this screening every year starting at age 34 if you have a 30-pack-year history of smoking and currently smoke or have quit within the past 15 years. Fecal occult blood test (FOBT) of the stool. You may have this test every year starting at age 5. Flexible sigmoidoscopy or colonoscopy. You may have a sigmoidoscopy every 5 years or a colonoscopy every 10 years starting at age 40. Hepatitis C blood test. Hepatitis B blood test. Sexually transmitted disease (STD) testing. Diabetes screening. This is done by checking your blood sugar (glucose) after you have not eaten for a while (fasting). You may have this done every 1-3 years. Bone density scan. This is done to screen for osteoporosis. You may have this done starting at age 49. Mammogram. This may be done every 1-2 years. Talk to your health care provider about how often you should have regular mammograms. Talk with your health  care provider about your test results, treatment options, and if necessary, the need for more tests. Vaccines  Your health care provider may recommend certain vaccines, such as: Influenza vaccine. This is recommended every year. Tetanus, diphtheria, and acellular pertussis (Tdap,  Td) vaccine. You may need a Td booster every 10 years. Zoster vaccine. You may need this after age 26. Pneumococcal 13-valent conjugate (PCV13) vaccine. One dose is recommended after age 30. Pneumococcal polysaccharide (PPSV23) vaccine. One dose is recommended after age 24. Talk to your health care provider about which screenings and vaccines you need and how often you need them. This information is not intended to replace advice given to you by your health care provider. Make sure you discuss any questions you have with your health care provider. Document Released: 04/13/2015 Document Revised: 12/05/2015 Document Reviewed: 01/16/2015 Elsevier Interactive Patient Education  2017 [employer].  Fall Prevention in the Home Falls can cause injuries. They can happen to people of all ages. There are many things you can do to make your home safe and to help prevent falls. What can I do on the outside of my home? Regularly fix the edges of walkways and driveways and fix any cracks. Remove anything that might make you trip as you walk through a door, such as a raised step or threshold. Trim any bushes or trees on the path to your home. Use bright outdoor lighting. Clear any walking paths of anything that might make someone trip, such as rocks or tools. Regularly check to see if handrails are loose or broken. Make sure that both sides of any steps have handrails. Any raised decks and porches should have guardrails on the edges. Have any leaves, snow, or ice cleared regularly. Use sand or salt on walking paths during winter. Clean up any spills in your garage right away. This includes oil or grease spills. What can I do in the bathroom? Use night lights. Install grab bars by the toilet and in the tub and shower. Do not use towel bars as grab bars. Use non-skid mats or decals in the tub or shower. If you need to sit down in the shower, use a plastic, non-slip stool. Keep the floor dry. Clean up any  water that spills on the floor as soon as it happens. Remove soap buildup in the tub or shower regularly. Attach bath mats securely with double-sided non-slip rug tape. Do not have throw rugs and other things on the floor that can make you trip. What can I do in the bedroom? Use night lights. Make sure that you have a light by your bed that is easy to reach. Do not use any sheets or blankets that are too big for your bed. They should not hang down onto the floor. Have a firm chair that has side arms. You can use this for support while you get dressed. Do not have throw rugs and other things on the floor that can make you trip. What can I do in the kitchen? Clean up any spills right away. Avoid walking on wet floors. Keep items that you use a lot in easy-to-reach places. If you need to reach something above you, use a strong step stool that has a grab bar. Keep electrical cords out of the way. Do not use floor polish or wax that makes floors slippery. If you must use wax, use non-skid floor wax. Do not have throw rugs and other things on the floor that can make you trip. What can  I do with my stairs? Do not leave any items on the stairs. Make sure that there are handrails on both sides of the stairs and use them. Fix handrails that are broken or loose. Make sure that handrails are as long as the stairways. Check any carpeting to make sure that it is firmly attached to the stairs. Fix any carpet that is loose or worn. Avoid having throw rugs at the top or bottom of the stairs. If you do have throw rugs, attach them to the floor with carpet tape. Make sure that you have a light switch at the top of the stairs and the bottom of the stairs. If you do not have them, ask someone to add them for you. What else can I do to help prevent falls? Wear shoes that: Do not have high heels. Have rubber bottoms. Are comfortable and fit you well. Are closed at the toe. Do not wear sandals. If you use a  stepladder: Make sure that it is fully opened. Do not climb a closed stepladder. Make sure that both sides of the stepladder are locked into place. Ask someone to hold it for you, if possible. Clearly mark and make sure that you can see: Any grab bars or handrails. First and last steps. Where the edge of each step is. Use tools that help you move around (mobility aids) if they are needed. These include: Canes. Walkers. Scooters. Crutches. Turn on the lights when you go into a dark area. Replace any light bulbs as soon as they burn out. Set up your furniture so you have a clear path. Avoid moving your furniture around. If any of your floors are uneven, fix them. If there are any pets around you, be aware of where they are. Review your medicines with your doctor. Some medicines can make you feel dizzy. This can increase your chance of falling. Ask your doctor what other things that you can do to help prevent falls. This information is not intended to replace advice given to you by your health care provider. Make sure you discuss any questions you have with your health care provider. Document Released: 01/11/2009 Document Revised: 08/23/2015 Document Reviewed: 04/21/2014 Elsevier Interactive Patient Education  2017 [employer].

## 2022-11-18 NOTE — Progress Notes (Signed)
Subjective:   Autumn Young is a 77 y.o. female who presents for an Initial Medicare Annual Wellness Visit.  Visit Complete: Virtual  I connected with  [name] on 11/18/22 by a audio enabled telemedicine application and verified that I am speaking with the correct person using two identifiers.  Patient Location: Home  Provider Location: Office/Clinic  I discussed the limitations of evaluation and management by telemedicine. The patient expressed understanding and agreed to proceed.  Vital Signs: Because this visit was a virtual/telehealth visit, some criteria may be missing or patient reported. Any vitals not documented were not able to be obtained and vitals that have been documented are patient reported.    Review of Systems     Cardiac Risk Factors include: advanced age (>67men, >73 women);dyslipidemia;hypertension;obesity (BMI >30kg/m2);family history of premature cardiovascular disease;sedentary lifestyle     Objective:    Today's Vitals   11/18/22 1105  Weight: (!) 307 lb (139.3 kg)  Height: 5\' 5"  (1.651 m)  PainSc: 0-No pain   Body mass index is 51.09 kg/m.     11/18/2022   11:10 AM 11/04/2022    1:09 PM 07/23/2022    8:08 AM 07/22/2022    8:25 PM 05/13/2022    7:57 PM 04/02/2022   10:05 AM 11/26/2021   11:18 AM  Advanced Directives  Does Patient Have a Medical Advance Directive? No No  No No Yes Yes  Type of Careers adviser;Living will   Would patient like information on creating a medical advance directive? No - Patient declined  No - Patient declined  No - Patient declined      Current Medications (verified) Outpatient Encounter Medications as of 11/18/2022  Medication Sig   acetaminophen (TYLENOL) 500 MG tablet Take 500 mg by mouth every 6 (six) hours as needed (pain).   albuterol (PROVENTIL HFA) 108 (90 Base) MCG/ACT inhaler Inhale 2 puffs into the lungs every 6 (six) hours as needed for wheezing or shortness of  breath.   albuterol (PROVENTIL) (2.5 MG/3ML) 0.083% nebulizer solution Take 3 mLs (2.5 mg total) by nebulization every 6 (six) hours as needed for wheezing or shortness of breath.   ascorbic acid (VITAMIN C) 1000 MG tablet Take 1,000 mg by mouth daily.   aspirin EC 81 MG tablet Take 1 tablet (81 mg total) by mouth daily. Swallow whole.   Carboxymethylcellulose Sodium (ARTIFICIAL TEARS OP) Place 1 drop into both eyes at bedtime as needed (dryness). (Patient not taking: Reported on 08/29/2022)   Cholecalciferol 25 MCG (1000 UT) tablet Take 1,000 Units by mouth in the morning.   Cyanocobalamin (VITAMIN B-12 PO) Take 6 drops by mouth in the morning.   guaiFENesin-dextromethorphan (ROBITUSSIN DM) 100-10 MG/5ML syrup Take 5 mLs by mouth every 4 (four) hours as needed for cough.   montelukast (SINGULAIR) 10 MG tablet Take 10 mg by mouth at bedtime.   olmesartan (BENICAR) 20 MG tablet Take 1 tablet (20 mg total) by mouth at bedtime.   Omega-3 Fatty Acids (OMEGA-3 PO) Take 1 capsule by mouth in the morning.   pantoprazole (PROTONIX) 40 MG tablet Take 1 tablet (40 mg total) by mouth daily before breakfast.   propranolol (INDERAL) 20 MG tablet TAKE 1 TABLET BY MOUTH TWICE A DAY   Red Yeast Rice Extract (RED YEAST RICE PO) Take 2 tablets by mouth daily.   SPIRIVA RESPIMAT 1.25 MCG/ACT AERS Inhale 1 puff into the lungs daily.   torsemide (  DEMADEX) 20 MG tablet Take 1 tablet (20 mg total) by mouth daily.   No facility-administered encounter medications on file as of 11/18/2022.    Allergies (verified) Shellfish allergy, Zocor [simvastatin], Lasix [furosemide], Latex, and Penicillins   History: Past Medical History:  Diagnosis Date   Asthma    COPD (chronic obstructive pulmonary disease) (HCC)    GERD (gastroesophageal reflux disease)    Hypertension    Stroke [facility])    Past Surgical History:  Procedure Laterality Date   PACEMAKER IMPLANT N/A 09/23/2021   Procedure: PACEMAKER IMPLANT;  Surgeon:  Lanier Prude, MD;  Location: [facility] INVASIVE CV LAB;  Service: Cardiovascular;  Laterality: N/A;   TUBAL LIGATION     Family History  Problem Relation Age of Onset   Cancer Mother        Lung, cervix, mouth   Hypertension Father    Cancer Sister        Colon   Diabetes Sister    Hypertension Sister    Heart disease Sister    Cancer Brother    Hypertension Brother    Social History   Socioeconomic History   Marital status: Divorced    Spouse name: Not on file   Number of children: Not on file   Years of education: Not on file   Highest education level: Not on file  Occupational History   Not on file  Tobacco Use   Smoking status: Former    Current packs/day: 0.00    Average packs/day: 3.0 packs/day for 2.0 years (6.0 ttl pk-yrs)    Types: Cigarettes    Start date: 76    Quit date: 58    Years since quitting: 39.6   Smokeless tobacco: Never  Vaping Use   Vaping status: Never Used  Substance and Sexual Activity   Alcohol use: No   Drug use: No   Sexual activity: Not on file  Other Topics Concern   Not on file  Social History Narrative   Lives with a friend.  3 children with one deceased.  Right handed. Husband has passed away   Social Determinants of Health   Financial Resource Strain: Low Risk  (11/18/2022)   Overall Financial Resource Strain (CARDIA)    Difficulty of Paying Living Expenses: Not hard at all  Food Insecurity: No Food Insecurity (11/18/2022)   Hunger Vital Sign    Worried About Running Out of Food in the Last Year: Never true    Ran Out of Food in the Last Year: Never true  Transportation Needs: No Transportation Needs (11/18/2022)   PRAPARE - Administrator, Civil Service (Medical): No    Lack of Transportation (Non-Medical): No  Physical Activity: Inactive (11/18/2022)   Exercise Vital Sign    Days of Exercise per Week: 0 days    Minutes of Exercise per Session: 0 min  Stress: No Stress Concern Present (11/18/2022)   [employer] of Occupational Health - Occupational Stress Questionnaire    Feeling of Stress : Not at all  Social Connections: Socially Integrated (11/18/2022)   Social Connection and Isolation Panel [NHANES]    Frequency of Communication with Friends and Family: More than three times a week    Frequency of Social Gatherings with Friends and Family: More than three times a week    Attends Religious Services: More than 4 times per year    Active Member of [employer] or Organizations: Yes    Attends Banker Meetings: More than  4 times per year    Marital Status: Married    Tobacco Counseling Counseling given: Not Answered   Clinical Intake:  Pre-visit preparation completed: Yes  Pain : No/denies pain Pain Score: 0-No pain     BMI - recorded: 51.09 Nutritional Status: BMI > 30  Obese Nutritional Risks: None Diabetes: No  How often do you need to have someone help you when you read instructions, pamphlets, or other written materials from your doctor or pharmacy?: 1 - Never What is the last grade level you completed in school?: College  Interpreter Needed?: No  Information entered by :: Tobi Leinweber N. Zyia Kaneko, LPN.   Activities of Daily Living    11/18/2022   11:12 AM 07/23/2022    2:00 AM  In your present state of health, do you have any difficulty performing the following activities:  Hearing? 0 0  Vision? 0 0  Difficulty concentrating or making decisions? 0 0  Walking or climbing stairs? 0 1  Dressing or bathing? 0 0  Doing errands, shopping? 0 1  Preparing Food and eating ? N   Using the Toilet? N   In the past six months, have you accidently leaked urine? Y   Do you have problems with loss of bowel control? N   Managing your Medications? N   Managing your Finances? N   Housekeeping or managing your Housekeeping? N     Patient Care Team: Georgina Quint, MD as PCP - General (Internal Medicine) Rollene Rotunda, MD as PCP - Cardiology  (Cardiology) Lanier Prude, MD as PCP - Electrophysiology (Cardiology) Van Clines, MD as Consulting Physician (Neurology)  Indicate any recent Medical Services you may have received from other than [facility] providers in the past year (date may be approximate).     Assessment:   This is a routine wellness examination for English.  Hearing/Vision screen Hearing Screening - Comments:: Patient denied any hearing difficulty.   No hearing aids.  Vision Screening - Comments:: Patient does wear otc readers.  Eye exam done by: Kendell Bane, [place]   Dietary issues and exercise activities discussed:     Goals Addressed             This Visit's Progress    Client understands the importance of follow-up with providers by attending scheduled visits        Depression Screen    11/18/2022   11:09 AM 09/25/2022    3:42 PM 08/06/2021   11:18 AM  PHQ 2/9 Scores  PHQ - 2 Score 0 0 0  PHQ- 9 Score 0      Fall Risk    11/18/2022   11:12 AM 11/04/2022    1:09 PM 09/25/2022    3:42 PM 04/02/2022   10:04 AM 11/26/2021   11:17 AM  Fall Risk   Falls in the past year? 0 0 0 0 0  Number falls in past yr: 0 0 0 0 0  Injury with Fall? 0 0 0 0 0  Risk for fall due to : No Fall Risks  No Fall Risks    Follow up Falls prevention discussed Falls evaluation completed Falls evaluation completed Falls evaluation completed     MEDICARE RISK AT HOME: Medicare Risk at Home Any stairs in or around the home?: No If so, are there any without handrails?: No Home free of loose throw rugs in walkways, pet beds, electrical cords, etc?: Yes Adequate lighting in your home to reduce risk of falls?: Yes Life  alert?: No Use of a cane, walker or w/c?: Yes Grab bars in the bathroom?: No Shower chair or bench in shower?: No Elevated toilet seat or a handicapped toilet?: No  TIMED UP AND GO:  Was the test performed? No    Cognitive Function: Normal cognitive status assessed by direct observation via telephone  conversation by this Nurse Health Advisor. No abnormalities found.        11/18/2022   11:13 AM  6CIT Screen  What Year? 0 points  What month? 0 points  What time? 0 points  Count back from 20 0 points  Months in reverse 0 points  Repeat phrase 0 points  Total Score 0 points    Immunizations Immunization History  Administered Date(s) Administered   Influenza-Unspecified 05/28/2015   Pneumococcal Conjugate-13 08/21/2014   Pneumococcal Polysaccharide-23 06/20/2012   Tdap 09/17/2017    TDAP status: Up to date  Flu Vaccine status: Declined, Education has been provided regarding the importance of this vaccine but patient still declined. Advised may receive this vaccine at local pharmacy or Health Dept. Aware to provide a copy of the vaccination record if obtained from local pharmacy or Health Dept. Verbalized acceptance and understanding.  Pneumococcal vaccine status: Up to date  Covid-19 vaccine status: Declined, Education has been provided regarding the importance of this vaccine but patient still declined. Advised may receive this vaccine at local pharmacy or Health Dept.or vaccine clinic. Aware to provide a copy of the vaccination record if obtained from local pharmacy or Health Dept. Verbalized acceptance and understanding.  Qualifies for Shingles Vaccine? Yes   Zostavax completed No   Shingrix Completed?: No.    Education has been provided regarding the importance of this vaccine. Patient has been advised to call insurance company to determine out of pocket expense if they have not yet received this vaccine. Advised may also receive vaccine at local pharmacy or Health Dept. Verbalized acceptance and understanding.  Screening Tests Health Maintenance  Topic Date Due   Hepatitis C Screening  Never done   DEXA SCAN  Never done   COVID-19 Vaccine (1 - 2023-24 season) Never done   INFLUENZA VACCINE  10/30/2022   Zoster Vaccines- Shingrix (1 of 2) 12/26/2022 (Originally  03/02/1996)   Medicare Annual Wellness (AWV)  11/18/2023   DTaP/Tdap/Td (2 - Td or Tdap) 09/18/2027   Pneumonia Vaccine 72+ Years old  Completed   HPV VACCINES  Aged Out    Health Maintenance  Health Maintenance Due  Topic Date Due   Hepatitis C Screening  Never done   DEXA SCAN  Never done   COVID-19 Vaccine (1 - 2023-24 season) Never done   INFLUENZA VACCINE  10/30/2022    Colorectal cancer screening: No longer required.   Mammogram status: No longer required due to age/patient declined.  Bone Density status: Never done.  Lung Cancer Screening: (Low Dose CT Chest recommended if Age 29-80 years, 20 pack-year currently smoking OR have quit w/in 15years.) does not qualify.   Lung Cancer Screening Referral: no  Additional Screening:  Hepatitis C Screening: does qualify; Completed: no  Vision Screening: Recommended annual ophthalmology exams for early detection of glaucoma and other disorders of the eye. Is the patient up to date with their annual eye exam?  No  Who is the provider or what is the name of the office in which the patient attends annual eye exams? Patient declined. If pt is not established with a provider, would they like to be referred to a  provider to establish care? No .   Dental Screening: Recommended annual dental exams for proper oral hygiene  Diabetic Foot Exam: N/A  Community Resource Referral / Chronic Care Management: CRR required this visit?  No   CCM required this visit?  No     Plan:     I have personally reviewed and noted the following in the patient's chart:   Medical and social history Use of alcohol, tobacco or illicit drugs  Current medications and supplements including opioid prescriptions. Patient is not currently taking opioid prescriptions. Functional ability and status Nutritional status Physical activity Advanced directives List of other physicians Hospitalizations, surgeries, and ER visits in previous 12  months Vitals Screenings to include cognitive, depression, and falls Referrals and appointments  In addition, I have reviewed and discussed with patient certain preventive protocols, quality metrics, and best practice recommendations. A written personalized care plan for preventive services as well as general preventive health recommendations were provided to patient.     Mickeal Needy, LPN   07/07/8117   After Visit Summary: (Mail) Due to this being a telephonic visit, the after visit summary with patients personalized plan was offered to patient via mail   Nurse Notes: Normal cognitive status assessed by direct observation via telephone conversation by this Nurse Health Advisor. No abnormalities found.  Vital Signs: Because this visit was a virtual/telehealth visit, some criteria may be missing or patient reported. Any vitals not documented were not able to be obtained and vitals that have been documented are patient reported.

## 2022-11-23 ENCOUNTER — Other Ambulatory Visit: Payer: Self-pay | Admitting: Internal Medicine

## 2022-12-02 ENCOUNTER — Ambulatory Visit: Payer: Medicare HMO | Admitting: Internal Medicine

## 2022-12-04 ENCOUNTER — Other Ambulatory Visit: Payer: Self-pay | Admitting: Emergency Medicine

## 2022-12-15 ENCOUNTER — Encounter: Payer: Self-pay | Admitting: Internal Medicine

## 2022-12-20 ENCOUNTER — Other Ambulatory Visit: Payer: Self-pay | Admitting: Internal Medicine

## 2022-12-23 ENCOUNTER — Ambulatory Visit: Payer: Medicare HMO

## 2022-12-23 DIAGNOSIS — I639 Cerebral infarction, unspecified: Secondary | ICD-10-CM

## 2022-12-26 LAB — CUP PACEART REMOTE DEVICE CHECK
Date Time Interrogation Session: 20240926073852
Implantable Lead Connection Status: 753985
Implantable Lead Connection Status: 753985
Implantable Lead Implant Date: 20230626
Implantable Lead Implant Date: 20230626
Implantable Lead Location: 753859
Implantable Lead Location: 753860
Implantable Lead Model: 377
Implantable Lead Model: 377
Implantable Lead Serial Number: 8000898701
Implantable Lead Serial Number: 8000923303
Implantable Pulse Generator Implant Date: 20230626
Pulse Gen Model: 407145
Pulse Gen Serial Number: 70433047

## 2022-12-29 ENCOUNTER — Ambulatory Visit (INDEPENDENT_AMBULATORY_CARE_PROVIDER_SITE_OTHER): Payer: Medicare HMO | Admitting: Emergency Medicine

## 2022-12-29 ENCOUNTER — Encounter: Payer: Self-pay | Admitting: Emergency Medicine

## 2022-12-29 ENCOUNTER — Other Ambulatory Visit: Payer: Self-pay | Admitting: Emergency Medicine

## 2022-12-29 VITALS — BP 160/98 | HR 81 | Temp 98.0°F | Ht 65.0 in | Wt 303.2 lb

## 2022-12-29 DIAGNOSIS — I5032 Chronic diastolic (congestive) heart failure: Secondary | ICD-10-CM

## 2022-12-29 DIAGNOSIS — I10 Essential (primary) hypertension: Secondary | ICD-10-CM | POA: Diagnosis not present

## 2022-12-29 DIAGNOSIS — B372 Candidiasis of skin and nail: Secondary | ICD-10-CM

## 2022-12-29 DIAGNOSIS — Z13820 Encounter for screening for osteoporosis: Secondary | ICD-10-CM

## 2022-12-29 DIAGNOSIS — R600 Localized edema: Secondary | ICD-10-CM

## 2022-12-29 DIAGNOSIS — J4520 Mild intermittent asthma, uncomplicated: Secondary | ICD-10-CM

## 2022-12-29 DIAGNOSIS — E785 Hyperlipidemia, unspecified: Secondary | ICD-10-CM

## 2022-12-29 DIAGNOSIS — J4489 Other specified chronic obstructive pulmonary disease: Secondary | ICD-10-CM

## 2022-12-29 MED ORDER — OLMESARTAN MEDOXOMIL 40 MG PO TABS: 40.0000 mg | ORAL_TABLET | Freq: Every day | ORAL | 3 refills | Status: DC

## 2022-12-29 MED ORDER — CLOTRIMAZOLE-BETAMETHASONE 1-0.05 % EX CREA
1.0000 | TOPICAL_CREAM | Freq: Two times a day (BID) | CUTANEOUS | 2 refills | Status: AC
Start: 2022-12-29 — End: ?

## 2022-12-29 NOTE — Assessment & Plan Note (Signed)
Chronic stable condition Continue Spiriva 1 puff daily

## 2022-12-29 NOTE — Assessment & Plan Note (Signed)
Diet and nutrition discussed.  Advised to decrease amount of daily carbohydrate intake and daily calories and increase amount of plant-based protein in her diet.

## 2022-12-29 NOTE — Assessment & Plan Note (Signed)
No signs or symptoms of acute CHF Stable and well-controlled Continue torsemide 10 mg daily and olmesartan 40 mg daily Diet and nutrition discussed

## 2022-12-29 NOTE — Assessment & Plan Note (Addendum)
Under breasts area Recommend to start Lotrisone twice a day for 7 to 10 days

## 2022-12-29 NOTE — Assessment & Plan Note (Signed)
Chronic stable condition Allergic to statins

## 2022-12-29 NOTE — Assessment & Plan Note (Signed)
Much improved.  Continue torsemide 20 mg daily

## 2022-12-29 NOTE — Assessment & Plan Note (Signed)
Stable without complications.  Continues albuterol as needed and Spiriva 1 puff daily

## 2022-12-29 NOTE — Progress Notes (Signed)
Autumn Young 77 y.o.   Chief Complaint  Patient presents with   Medical Management of Chronic Issues    f/u appt, patient states she has a rash under her breast, looks better now     HISTORY OF PRESENT ILLNESS: This is a 77 y.o. female here for 61-month follow-up of chronic medical problems Overall doing much better Complaining of itchy rash to area under her breasts No other complaints or medical concerns today. Peripheral edema much improved.  HPI   Prior to Admission medications   Medication Sig Start Date End Date Taking? Authorizing Provider  acetaminophen (TYLENOL) 500 MG tablet Take 500 mg by mouth every 6 (six) hours as needed (pain).   Yes [provider]  albuterol (PROVENTIL HFA) 108 (90 Base) MCG/ACT inhaler Inhale 2 puffs into the lungs every 6 (six) hours as needed for wheezing or shortness of breath. 07/26/22  Yes Gherghe, Daylene Katayama, MD  albuterol (PROVENTIL) (2.5 MG/3ML) 0.083% nebulizer solution Take 3 mLs (2.5 mg total) by nebulization every 6 (six) hours as needed for wheezing or shortness of breath. 08/29/22  Yes Charlott Holler, MD  ascorbic acid (VITAMIN C) 1000 MG tablet Take 1,000 mg by mouth daily. 05/14/18  Yes [provider]  aspirin EC 81 MG tablet Take 1 tablet (81 mg total) by mouth daily. Swallow whole. 07/26/22 07/26/23 Yes Leatha Gilding, MD  Cholecalciferol 25 MCG (1000 UT) tablet Take 1,000 Units by mouth in the morning.   Yes [provider]  clopidogrel (PLAVIX) 75 MG tablet Take 75 mg by mouth every morning. 11/24/22  Yes [provider]  Cyanocobalamin (VITAMIN B-12 PO) Take 6 drops by mouth in the morning.   Yes [provider]  montelukast (SINGULAIR) 10 MG tablet Take 10 mg by mouth at bedtime. 03/26/21  Yes [provider]  olmesartan (BENICAR) 20 MG tablet TAKE 1 TABLET BY MOUTH EVERYDAY AT BEDTIME 12/04/22  Yes [place], Eilleen Kempf, MD  Omega-3 Fatty Acids (OMEGA-3 PO) Take 1 capsule  by mouth in the morning.   Yes [provider]  pantoprazole (PROTONIX) 40 MG tablet Take 1 tablet (40 mg total) by mouth daily before breakfast. 07/26/22  Yes Gherghe, Daylene Katayama, MD  Red Yeast Rice Extract (RED YEAST RICE PO) Take 2 tablets by mouth daily.   Yes [provider]  SPIRIVA RESPIMAT 1.25 MCG/ACT AERS Inhale 1 puff into the lungs daily. 08/29/22  Yes Charlott Holler, MD  torsemide (DEMADEX) 20 MG tablet Take 1 tablet (20 mg total) by mouth daily. 09/25/22  Yes Shaune Westfall, Eilleen Kempf, MD  Carboxymethylcellulose Sodium (ARTIFICIAL TEARS OP) Place 1 drop into both eyes at bedtime as needed (dryness). Patient not taking: Reported on 08/29/2022    [provider]  guaiFENesin-dextromethorphan (ROBITUSSIN DM) 100-10 MG/5ML syrup Take 5 mLs by mouth every 4 (four) hours as needed for cough. Patient not taking: Reported on 12/29/2022 07/26/22   Leatha Gilding, MD  propranolol (INDERAL) 20 MG tablet TAKE 1 TABLET BY MOUTH TWICE A DAY Patient not taking: Reported on 12/29/2022 11/03/22   Lanier Prude, MD    Allergies  Allergen Reactions   Shellfish Allergy Anaphylaxis   Zocor [Simvastatin] Swelling   Lasix [Furosemide] Swelling   Latex Rash   Penicillins Rash    Patient Active Problem List   Diagnosis Date Noted   Peripheral edema 09/25/2022   Chronic heart failure with preserved ejection fraction (HCC) 09/25/2022   Morbid obesity (HCC) 09/25/2022  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   Dyslipidemia 08/06/2021   History of CVA (cerebrovascular accident) 06/07/2021   OSA (obstructive sleep apnea) 11/28/2015   COPD (chronic obstructive pulmonary disease) with chronic bronchitis 06/26/2015   Pulmonary hypertension (HCC) 06/26/2015   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   Mild intermittent asthma without complication 11/19/2014   Essential hypertension 11/18/2014    Past Medical History:  Diagnosis Date   Asthma    COPD (chronic obstructive pulmonary  disease) (HCC)    GERD (gastroesophageal reflux disease)    Hypertension    Stroke [facility])     Past Surgical History:  Procedure Laterality Date   PACEMAKER IMPLANT N/A 09/23/2021   Procedure: PACEMAKER IMPLANT;  Surgeon: Lanier Prude, MD;  Location: [facility] IN[facility];  Service: Cardiovascular;  Laterality: N/A;   TUBAL LIGATION      Social History   Socioeconomic History   Marital status: Divorced    Spouse name: Not on file   Number of children: Not on file   Years of education: Not on file   Highest education level: Not on file  Occupational History   Not on file  Tobacco Use   Smoking status: Former    Current packs/day: 0.00    Average packs/day: 3.0 packs/day for 2.0 years (6.0 ttl pk-yrs)    Types: Cigarettes    Start date: 25    Quit date: 42    Years since quitting: 39.7   Smokeless tobacco: Never  Vaping Use   Vaping status: Never Used  Substance and Sexual Activity   Alcohol use: No   Drug use: No   Sexual activity: Not on file  Other Topics Concern   Not on file  Social History Narrative   Lives with a friend.  3 children with one deceased.  Right handed. Husband has passed away   Social Determinants of Health   Financial Resource Strain: Low Risk  (11/18/2022)   Overall Financial Resource Strain (CARDIA)    Difficulty of Paying Living Expenses: Not hard at all  Food Insecurity: No Food Insecurity (11/18/2022)   Hunger Vital Sign    Worried About Running Out of Food in the Last Year: Never true    Ran Out of Food in the Last Year: Never true  Transportation Needs: No Transportation Needs (11/18/2022)   PRAPARE - Administrator, Civil Service (Medical): No    Lack of Transportation (Non-Medical): No  Physical Activity: Inactive (11/18/2022)   Exercise Vital Sign    Days of Exercise per Week: 0 days    Minutes of Exercise per Session: 0 min  Stress: No Stress Concern Present (11/18/2022)   [employer] of Occupational Health -  Occupational Stress Questionnaire    Feeling of Stress : Not at all  Social Connections: Socially Integrated (11/18/2022)   Social Connection and Isolation Panel [NHANES]    Frequency of Communication with Friends and Family: More than three times a week    Frequency of Social Gatherings with Friends and Family: More than three times a week    Attends Religious Services: More than 4 times per year    Active Member of [employer] or Organizations: Yes    Attends Engineer, structural: More than 4 times per year    Marital Status: Married  Catering manager Violence: Not At Risk (11/18/2022)   Humiliation, Afraid, Rape, and Kick questionnaire    Fear of Current or Ex-Partner: No    Emotionally Abused: No    Physically  Abused: No    Sexually Abused: No    Family History  Problem Relation Age of Onset   Cancer Mother        Lung, cervix, mouth   Hypertension Father    Cancer Sister        Colon   Diabetes Sister    Hypertension Sister    Heart disease Sister    Cancer Brother    Hypertension Brother      Review of Systems  Constitutional: Negative.  Negative for chills and fever.  HENT: Negative.  Negative for congestion and sore throat.   Respiratory: Negative.  Negative for cough and shortness of breath.   Cardiovascular: Negative.  Negative for chest pain and palpitations.  Gastrointestinal:  Negative for abdominal pain, diarrhea, nausea and vomiting.  Genitourinary: Negative.   Skin:  Positive for itching and rash.  Neurological: Negative.  Negative for dizziness and headaches.  All other systems reviewed and are negative.   Today's Vitals   12/29/22 1040  BP: (!) 160/98  Pulse: 81  Temp: 98 F (36.7 C)  TempSrc: Oral  SpO2: 92%  Weight: (!) 303 lb 4 oz (137.6 kg)  Height: 5\' 5"  (1.651 m)   Body mass index is 50.46 kg/m.   Physical Exam Vitals reviewed.  Constitutional:      Appearance: Normal appearance. She is obese.  HENT:     Head: Normocephalic.   Eyes:     Extraocular Movements: Extraocular movements intact.  Cardiovascular:     Rate and Rhythm: Normal rate and regular rhythm.     Pulses: Normal pulses.     Heart sounds: Normal heart sounds.  Pulmonary:     Effort: Pulmonary effort is normal.     Breath sounds: Normal breath sounds.  Musculoskeletal:     Cervical back: No tenderness.     Right lower leg: No edema.     Left lower leg: No edema.  Lymphadenopathy:     Cervical: No cervical adenopathy.  Skin:    Capillary Refill: Capillary refill takes less than 2 seconds.     Findings: Rash present.     Comments: Yeast dermatitis rash to areas under both breasts left more than right  Neurological:     General: No focal deficit present.     Mental Status: She is alert and oriented to person, place, and time.  Psychiatric:        Mood and Affect: Mood normal.        Behavior: Behavior normal.      ASSESSMENT & PLAN: A total of 44 minutes was spent with the patient and counseling/coordination of care regarding preparing for this visit, review of most recent office visit notes, review of most recent neurologist office visit note for essential tremors, review of multiple chronic medical conditions under management, review of all medications and changes made, cardiovascular risks associated with uncontrolled hypertension including possibility of stroke, education on nutrition, review of most recent blood work results, prognosis, documentation and need for follow-up.  Problem List Items Addressed This Visit       Cardiovascular and Mediastinum   Essential hypertension - Primary (Chronic)    Uncontrolled hypertension. BP Readings from Last 3 Encounters:  12/29/22 (!) 160/98  11/04/22 (!) 187/77  09/25/22 138/86  Recommend to increase olmesartan to 40 mg daily Continue torsemide 10 mg daily Cardiovascular risks associated with hypertension discussed Diet and nutrition discussed       Relevant Medications   olmesartan  (BENICAR) 40  MG tablet   Chronic heart failure with preserved ejection fraction (HCC)    No signs or symptoms of acute CHF Stable and well-controlled Continue torsemide 10 mg daily and olmesartan 40 mg daily Diet and nutrition discussed      Relevant Medications   olmesartan (BENICAR) 40 MG tablet     Respiratory   COPD (chronic obstructive pulmonary disease) with chronic bronchitis    Chronic stable condition Continue Spiriva 1 puff daily      Mild intermittent asthma without complication    Stable without complications.  Continues albuterol as needed and Spiriva 1 puff daily        Musculoskeletal and Integument   Yeast dermatitis    Under breasts area Recommend to start Lotrisone twice a day for 7 to 10 days      Relevant Medications   clotrimazole-betamethasone (LOTRISONE) cream     Other   Dyslipidemia    Chronic stable condition Allergic to statins      Peripheral edema    Much improved.  Continue torsemide 20 mg daily      Morbid obesity (HCC)    Diet and nutrition discussed Advised to decrease amount of daily carbohydrate intake and daily calories and increase amount of plant-based protein in her diet      Other Visit Diagnoses     Osteoporosis screening       Relevant Orders   DG Bone Density      Patient Instructions  Health Maintenance After Age 39 After age 26, you are at a higher risk for certain long-term diseases and infections as well as injuries from falls. Falls are a major cause of broken bones and head injuries in people who are older than age 64. Getting regular preventive care can help to keep you healthy and well. Preventive care includes getting regular testing and making lifestyle changes as recommended by your health care provider. Talk with your health care provider about: Which screenings and tests you should have. A screening is a test that checks for a disease when you have no symptoms. A diet and exercise plan that is right for  you. What should I know about screenings and tests to prevent falls? Screening and testing are the best ways to find a health problem early. Early diagnosis and treatment give you the best chance of managing medical conditions that are common after age 35. Certain conditions and lifestyle choices may make you more likely to have a fall. Your health care provider may recommend: Regular vision checks. Poor vision and conditions such as cataracts can make you more likely to have a fall. If you wear glasses, make sure to get your prescription updated if your vision changes. Medicine review. Work with your health care provider to regularly review all of the medicines you are taking, including over-the-counter medicines. Ask your health care provider about any side effects that may make you more likely to have a fall. Tell your health care provider if any medicines that you take make you feel dizzy or sleepy. Strength and balance checks. Your health care provider may recommend certain tests to check your strength and balance while standing, walking, or changing positions. Foot health exam. Foot pain and numbness, as well as not wearing proper footwear, can make you more likely to have a fall. Screenings, including: Osteoporosis screening. Osteoporosis is a condition that causes the bones to get weaker and break more easily. Blood pressure screening. Blood pressure changes and medicines to control blood pressure  can make you feel dizzy. Depression screening. You may be more likely to have a fall if you have a fear of falling, feel depressed, or feel unable to do activities that you used to do. Alcohol use screening. Using too much alcohol can affect your balance and may make you more likely to have a fall. Follow these instructions at home: Lifestyle Do not drink alcohol if: Your health care provider tells you not to drink. If you drink alcohol: Limit how much you have to: 0-1 drink a day for women. 0-2  drinks a day for men. Know how much alcohol is in your drink. In the [place] one drink equals one 12 oz bottle of beer (355 mL), one 5 oz glass of wine (148 mL), or one 1 oz glass of hard liquor (44 mL). Do not use any products that contain nicotine or tobacco. These products include cigarettes, chewing tobacco, and vaping devices, such as e-cigarettes. If you need help quitting, ask your health care provider. Activity  Follow a regular exercise program to stay fit. This will help you maintain your balance. Ask your health care provider what types of exercise are appropriate for you. If you need a cane or walker, use it as recommended by your health care provider. Wear supportive shoes that have nonskid soles. Safety  Remove any tripping hazards, such as rugs, cords, and clutter. Install safety equipment such as grab bars in bathrooms and safety rails on stairs. Keep rooms and walkways well-lit. General instructions Talk with your health care provider about your risks for falling. Tell your health care provider if: You fall. Be sure to tell your health care provider about all falls, even ones that seem minor. You feel dizzy, tiredness (fatigue), or off-balance. Take over-the-counter and prescription medicines only as told by your health care provider. These include supplements. Eat a healthy diet and maintain a healthy weight. A healthy diet includes low-fat dairy products, low-fat (lean) meats, and fiber from whole grains, beans, and lots of fruits and vegetables. Stay current with your vaccines. Schedule regular health, dental, and eye exams. Summary Having a healthy lifestyle and getting preventive care can help to protect your health and wellness after age 52. Screening and testing are the best way to find a health problem early and help you avoid having a fall. Early diagnosis and treatment give you the best chance for managing medical conditions that are more common for people who are  older than age 25. Falls are a major cause of broken bones and head injuries in people who are older than age 7. Take precautions to prevent a fall at home. Work with your health care provider to learn what changes you can make to improve your health and wellness and to prevent falls. This information is not intended to replace advice given to you by your health care provider. Make sure you discuss any questions you have with your health care provider. Document Revised: 08/06/2020 Document Reviewed: 08/06/2020 Elsevier Patient Education  2024 Elsevier Inc.    Edwina Barth, MD [facility]

## 2022-12-29 NOTE — Patient Instructions (Signed)
Health Maintenance After Age 79 After age 79, you are at a higher risk for certain long-term diseases and infections as well as injuries from falls. Falls are a major cause of broken bones and head injuries in people who are older than age 79. Getting regular preventive care can help to keep you healthy and well. Preventive care includes getting regular testing and making lifestyle changes as recommended by your health care provider. Talk with your health care provider about: Which screenings and tests you should have. A screening is a test that checks for a disease when you have no symptoms. A diet and exercise plan that is right for you. What should I know about screenings and tests to prevent falls? Screening and testing are the best ways to find a health problem early. Early diagnosis and treatment give you the best chance of managing medical conditions that are common after age 79. Certain conditions and lifestyle choices may make you more likely to have a fall. Your health care provider may recommend: Regular vision checks. Poor vision and conditions such as cataracts can make you more likely to have a fall. If you wear glasses, make sure to get your prescription updated if your vision changes. Medicine review. Work with your health care provider to regularly review all of the medicines you are taking, including over-the-counter medicines. Ask your health care provider about any side effects that may make you more likely to have a fall. Tell your health care provider if any medicines that you take make you feel dizzy or sleepy. Strength and balance checks. Your health care provider may recommend certain tests to check your strength and balance while standing, walking, or changing positions. Foot health exam. Foot pain and numbness, as well as not wearing proper footwear, can make you more likely to have a fall. Screenings, including: Osteoporosis screening. Osteoporosis is a condition that causes  the bones to get weaker and break more easily. Blood pressure screening. Blood pressure changes and medicines to control blood pressure can make you feel dizzy. Depression screening. You may be more likely to have a fall if you have a fear of falling, feel depressed, or feel unable to do activities that you used to do. Alcohol use screening. Using too much alcohol can affect your balance and may make you more likely to have a fall. Follow these instructions at home: Lifestyle Do not drink alcohol if: Your health care provider tells you not to drink. If you drink alcohol: Limit how much you have to: 0-1 drink a day for women. 0-2 drinks a day for men. Know how much alcohol is in your drink. In the [place] one drink equals one 12 oz bottle of beer (355 mL), one 5 oz glass of wine (148 mL), or one 1 oz glass of hard liquor (44 mL). Do not use any products that contain nicotine or tobacco. These products include cigarettes, chewing tobacco, and vaping devices, such as e-cigarettes. If you need help quitting, ask your health care provider. Activity  Follow a regular exercise program to stay fit. This will help you maintain your balance. Ask your health care provider what types of exercise are appropriate for you. If you need a cane or walker, use it as recommended by your health care provider. Wear supportive shoes that have nonskid soles. Safety  Remove any tripping hazards, such as rugs, cords, and clutter. Install safety equipment such as grab bars in bathrooms and safety rails on stairs. Keep rooms and walkways   well-lit. General instructions Talk with your health care provider about your risks for falling. Tell your health care provider if: You fall. Be sure to tell your health care provider about all falls, even ones that seem minor. You feel dizzy, tiredness (fatigue), or off-balance. Take over-the-counter and prescription medicines only as told by your health care provider. These include  supplements. Eat a healthy diet and maintain a healthy weight. A healthy diet includes low-fat dairy products, low-fat (lean) meats, and fiber from whole grains, beans, and lots of fruits and vegetables. Stay current with your vaccines. Schedule regular health, dental, and eye exams. Summary Having a healthy lifestyle and getting preventive care can help to protect your health and wellness after age 79. Screening and testing are the best way to find a health problem early and help you avoid having a fall. Early diagnosis and treatment give you the best chance for managing medical conditions that are more common for people who are older than age 79. Falls are a major cause of broken bones and head injuries in people who are older than age 79. Take precautions to prevent a fall at home. Work with your health care provider to learn what changes you can make to improve your health and wellness and to prevent falls. This information is not intended to replace advice given to you by your health care provider. Make sure you discuss any questions you have with your health care provider. Document Revised: 08/06/2020 Document Reviewed: 08/06/2020 Elsevier Patient Education  2024 Elsevier Inc.  

## 2022-12-29 NOTE — Assessment & Plan Note (Addendum)
Uncontrolled hypertension. BP Readings from Last 3 Encounters:  12/29/22 (!) 160/98  11/04/22 (!) 187/77  09/25/22 138/86  Recommend to increase olmesartan to 40 mg daily Continue torsemide 10 mg daily Cardiovascular risks associated with hypertension discussed Diet and nutrition discussed

## 2022-12-31 ENCOUNTER — Telehealth: Payer: Self-pay | Admitting: Emergency Medicine

## 2022-12-31 NOTE — Telephone Encounter (Signed)
Okay to prescribe pantoprazole

## 2022-12-31 NOTE — Telephone Encounter (Signed)
Prescription Request  12/31/2022  LOV: 12/29/2022  What is the name of the medication or equipment? pantoprazole  Have you contacted your pharmacy to request a refill? Yes   Which pharmacy would you like this sent to?  [id number] - [place], [place] - [address] [place] [address] Phone: [phone] Fax: [phone]    Patient notified that their request is being sent to the clinical staff for review and that they should receive a response within 2 business days.   Please advise at Mobile [phone] (mobile)

## 2023-01-01 MED ORDER — PANTOPRAZOLE SODIUM 40 MG PO TBEC: 40.0000 mg | DELAYED_RELEASE_TABLET | Freq: Every day | ORAL | 1 refills | Status: DC

## 2023-01-01 NOTE — Telephone Encounter (Signed)
Medication sent to patient pharmacy.

## 2023-01-08 NOTE — Progress Notes (Signed)
Remote pacemaker transmission.   

## 2023-01-27 ENCOUNTER — Other Ambulatory Visit: Payer: Self-pay | Admitting: Emergency Medicine

## 2023-02-01 ENCOUNTER — Other Ambulatory Visit: Payer: Self-pay | Admitting: Emergency Medicine

## 2023-02-11 ENCOUNTER — Ambulatory Visit (INDEPENDENT_AMBULATORY_CARE_PROVIDER_SITE_OTHER): Payer: Medicare HMO | Admitting: Emergency Medicine

## 2023-02-11 ENCOUNTER — Encounter: Payer: Self-pay | Admitting: Emergency Medicine

## 2023-02-11 ENCOUNTER — Ambulatory Visit (INDEPENDENT_AMBULATORY_CARE_PROVIDER_SITE_OTHER): Payer: Medicare HMO

## 2023-02-11 ENCOUNTER — Ambulatory Visit: Payer: Self-pay | Admitting: Neurology

## 2023-02-11 VITALS — BP 134/82 | HR 70 | Temp 98.4°F | Ht 65.0 in | Wt 304.6 lb

## 2023-02-11 DIAGNOSIS — R1319 Other dysphagia: Secondary | ICD-10-CM | POA: Diagnosis not present

## 2023-02-11 LAB — COMPREHENSIVE METABOLIC PANEL
ALT: 21 U/L (ref 0–35)
AST: 34 U/L (ref 0–37)
Albumin: 3.9 g/dL (ref 3.5–5.2)
Alkaline Phosphatase: 71 U/L (ref 39–117)
BUN: 8 mg/dL (ref 6–23)
CO2: 30 meq/L (ref 19–32)
Calcium: 9.2 mg/dL (ref 8.4–10.5)
Chloride: 101 meq/L (ref 96–112)
Creatinine, Ser: 0.8 mg/dL (ref 0.40–1.20)
GFR: 70.31 mL/min (ref >60.00)
Glucose, Bld: 99 mg/dL (ref 70–99)
Potassium: 3.8 meq/L (ref 3.5–5.1)
Sodium: 139 meq/L (ref 135–145)
Total Bilirubin: 0.5 mg/dL (ref 0.2–1.2)
Total Protein: 8.1 g/dL (ref 6.0–8.3)

## 2023-02-11 LAB — CBC WITH DIFFERENTIAL/PLATELET
Basophils Absolute: 0 10*3/uL (ref 0.0–0.1)
Basophils Relative: 0.6 % (ref 0.0–3.0)
Eosinophils Absolute: 0.1 10*3/uL (ref 0.0–0.7)
Eosinophils Relative: 1.9 % (ref 0.0–5.0)
HCT: 34.4 % — ABNORMAL LOW (ref 36.0–46.0)
Hemoglobin: 10.9 g/dL — ABNORMAL LOW (ref 12.0–15.0)
Lymphocytes Relative: 24.3 % (ref 12.0–46.0)
Lymphs Abs: 1.6 10*3/uL (ref 0.7–4.0)
MCHC: 31.7 g/dL (ref 30.0–36.0)
MCV: 83.9 fL (ref 78.0–100.0)
Monocytes Absolute: 0.6 10*3/uL (ref 0.1–1.0)
Monocytes Relative: 9.7 % (ref 3.0–12.0)
Neutro Abs: 4.2 10*3/uL (ref 1.4–7.7)
Neutrophils Relative %: 63.5 % (ref 43.0–77.0)
Platelets: 276 10*3/uL (ref 150.0–400.0)
RBC: 4.1 Mil/uL (ref 3.87–5.11)
RDW: 14.4 % (ref 11.5–15.5)
WBC: 6.7 10*3/uL (ref 4.0–10.5)

## 2023-02-11 LAB — LIPID PANEL
Cholesterol: 200 mg/dL (ref 0–200)
HDL: 46.6 mg/dL (ref >39.00)
LDL Cholesterol: 118 mg/dL — ABNORMAL HIGH (ref 0–99)
NonHDL: 153.75
Total CHOL/HDL Ratio: 4
Triglycerides: 177 mg/dL — ABNORMAL HIGH (ref 0.0–149.0)
VLDL: 35.4 mg/dL (ref 0.0–40.0)

## 2023-02-11 NOTE — Assessment & Plan Note (Signed)
Clinically stable. Differential diagnosis discussed including possibility of esophageal cancer Needs GI evaluation and upper endoscopy.  Referral placed today. Recommend chest x-ray and blood work today. Diet and nutrition discussed.  Advised to stay on soft diet and plenty of liquids until further assessment by GI doctor.

## 2023-02-11 NOTE — Patient Instructions (Signed)
 Dysphagia    Dysphagia is trouble swallowing. This condition occurs when solids and liquids stick in a person's throat on the way down to the stomach, or when food takes longer to get to the stomach than usual.  You may have problems swallowing food, liquids, or both. You may also have pain while trying to swallow. It may take you more time and effort to swallow something.  What are the causes?  This condition may be caused by:  Muscle problems. These may make it difficult for you to move food and liquids through the esophagus, which is the tube that connects your mouth to your stomach.  Blockages. You may have ulcers, scar tissue, or inflammation that blocks the normal passage of food and liquids. Causes of these problems include:  Acid reflux from your stomach into your esophagus (gastroesophageal reflux).  Infections.  Radiation treatment for cancer.  Medicines taken without enough fluids to wash them down into your stomach.  Stroke. This can affect the nerves and make it difficult to swallow.  Nerve problems. These prevent signals from being sent to the muscles of your esophagus to squeeze (contract) and move what you swallow down to your stomach.  Globus pharyngeus. This is a common problem that involves a feeling like something is stuck in your throat or a sense of trouble with swallowing, even though nothing is wrong with the swallowing passages.  Certain conditions, such as cerebral palsy or Parkinson's disease.  What are the signs or symptoms?  Common symptoms of this condition include:  A feeling that solids or liquids are stuck in your throat on the way down to the stomach.  Pain while swallowing.  Coughing or gagging while trying to swallow.  Other symptoms include:  Food moving back from your stomach to your mouth (regurgitation).  Noises coming from your throat.  Chest discomfort when swallowing.  A feeling of fullness when swallowing.  Drooling, especially when the throat is blocked.  Heartburn.  How  is this diagnosed?  This condition may be diagnosed by:  Barium swallow X-ray. In this test, you will swallow a white liquid that sticks to the inside of your esophagus. X-ray images are then taken.  Endoscopy. In this test, a flexible telescope is inserted down your throat to look at your esophagus and your stomach.  CT scans or an MRI.  How is this treated?  Treatment for dysphagia depends on the cause of this condition:  If the dysphagia is caused by acid reflux or infection, medicines may be used. These may include antibiotics or heartburn medicines.  If the dysphagia is caused by problems with the muscles, swallowing therapy may be used to help you strengthen your swallowing muscles. You may have to do specific exercises to strengthen the muscles or stretch them.  If the dysphagia is caused by a blockage or mass, procedures to remove the blockage may be done. You may need surgery and a feeding tube.  You may need to make diet changes. Ask your health care provider for specific instructions.  Follow these instructions at home:  Medicines  Take over-the-counter and prescription medicines only as told by your health care provider.  If you were prescribed an antibiotic medicine, take it as told by your health care provider. Do not stop taking the antibiotic even if you start to feel better.  Eating and drinking    Make any diet changes as told by your health care provider.  Work with a diet and nutrition specialist (  dietitian) to create an eating plan that will help you get the nutrients you need in order to stay healthy.  Eat soft foods that are easier to swallow.  Cut your food into small pieces and eat slowly. Take small bites.  Eat and drink only when you are sitting upright.  Do not drink alcohol or caffeine. If you need help quitting, ask your health care provider.  General instructions  Check your weight every day to make sure you are not losing weight.  Do not use any products that contain nicotine or  tobacco. These products include cigarettes, chewing tobacco, and vaping devices, such as e-cigarettes. If you need help quitting, ask your health care provider.  Keep all follow-up visits. This is important.  Contact a health care provider if:  You lose weight because you cannot swallow.  You cough when you drink liquids.  You cough up partially digested food.  Get help right away if:  You cannot swallow your saliva.  You have shortness of breath, a fever, or both.  Your voice is hoarse and you have trouble swallowing.  These symptoms may represent a serious problem that is an emergency. Do not wait to see if the symptoms will go away. Get medical help right away. Call your local emergency services (911 in the [place] Do not drive yourself to the hospital.  Summary  Dysphagia is trouble swallowing. This condition occurs when solids and liquids stick in a person's throat on the way down to the stomach. You may cough or gag while trying to swallow.  Dysphagia has many possible causes.  Treatment for dysphagia depends on the cause of the condition.  Keep all follow-up visits. This is important.  This information is not intended to replace advice given to you by your health care provider. Make sure you discuss any questions you have with your health care provider.  Document Revised: 11/05/2019 Document Reviewed: 11/05/2019  Elsevier Patient Education  2024 [employer].

## 2023-02-11 NOTE — Progress Notes (Addendum)
Autumn Young 77 y.o.   Chief Complaint  Patient presents with  . diffuclty keep food down     Happening for 2weeks. She states when she eats an hour later she'll cough a little she'll throw it up. She states she's been eating ice chips and water and managing with that. Peaches with cottage cheese is something she can sometime keep down. Any meats, potatoes, pasta is when she has this issues. No nausea, extremities swelling.      HISTORY OF PRESENT ILLNESS: This is a 77 y.o. female complaining of difficulty swallowing solids that started about 2 weeks ago Able to swallow liquids okay Non-smoker. No other associated symptoms. No other complaints or medical concerns today.  HPI   Prior to Admission medications   Medication Sig Start Date End Date Taking? Authorizing Provider  acetaminophen (TYLENOL) 500 MG tablet Take 500 mg by mouth every 6 (six) hours as needed (pain).   Yes [provider]  albuterol (PROVENTIL HFA) 108 (90 Base) MCG/ACT inhaler Inhale 2 puffs into the lungs every 6 (six) hours as needed for wheezing or shortness of breath. 07/26/22  Yes Gherghe, Daylene Katayama, MD  albuterol (PROVENTIL) (2.5 MG/3ML) 0.083% nebulizer solution Take 3 mLs (2.5 mg total) by nebulization every 6 (six) hours as needed for wheezing or shortness of breath. 08/29/22  Yes Charlott Holler, MD  ascorbic acid (VITAMIN C) 1000 MG tablet Take 1,000 mg by mouth daily. 05/14/18  Yes [provider]  aspirin EC 81 MG tablet Take 1 tablet (81 mg total) by mouth daily. Swallow whole. 07/26/22 07/26/23 Yes Leatha Gilding, MD  Cholecalciferol 25 MCG (1000 UT) tablet Take 1,000 Units by mouth in the morning.   Yes [provider]  clopidogrel (PLAVIX) 75 MG tablet Take 75 mg by mouth every morning. 11/24/22  Yes [provider]  clotrimazole-betamethasone (LOTRISONE) cream Apply 1 Application topically 2 (two) times daily. 12/29/22  Yes Rodgers Likes, Eilleen Kempf, MD  Cyanocobalamin  (VITAMIN B-12 PO) Take 6 drops by mouth in the morning.   Yes [provider]  montelukast (SINGULAIR) 10 MG tablet Take 10 mg by mouth at bedtime. 03/26/21  Yes [provider]  olmesartan (BENICAR) 40 MG tablet Take 1 tablet (40 mg total) by mouth daily. 12/29/22  Yes Lee-Ann Gal, Eilleen Kempf, MD  Omega-3 Fatty Acids (OMEGA-3 PO) Take 1 capsule by mouth in the morning.   Yes [provider]  pantoprazole (PROTONIX) 40 MG tablet TAKE 1 TABLET BY MOUTH DAILY BEFORE BREAKFAST 01/27/23  Yes Rachel Rison, Eilleen Kempf, MD  Red Yeast Rice Extract (RED YEAST RICE PO) Take 2 tablets by mouth daily.   Yes [provider]  SPIRIVA RESPIMAT 1.25 MCG/ACT AERS Inhale 1 puff into the lungs daily. 08/29/22  Yes Charlott Holler, MD  torsemide (DEMADEX) 20 MG tablet TAKE 1 TABLET BY MOUTH EVERY DAY 12/29/22  Yes Aaleyah Witherow, Eilleen Kempf, MD  Carboxymethylcellulose Sodium (ARTIFICIAL TEARS OP) Place 1 drop into both eyes at bedtime as needed (dryness). Patient not taking: Reported on 08/29/2022    [provider]  guaiFENesin-dextromethorphan (ROBITUSSIN DM) 100-10 MG/5ML syrup Take 5 mLs by mouth every 4 (four) hours as needed for cough. Patient not taking: Reported on 12/29/2022 07/26/22   Leatha Gilding, MD  propranolol (INDERAL) 20 MG tablet TAKE 1 TABLET BY MOUTH TWICE A DAY Patient not taking: Reported on 12/29/2022 11/03/22   Lanier Prude, MD    Allergies  Allergen Reactions  . Shellfish  Allergy Anaphylaxis  . Zocor [Simvastatin] Swelling  . Lasix [Furosemide] Swelling  . Latex Rash  . Penicillins Rash    Patient Active Problem List   Diagnosis Date Noted  . Yeast dermatitis 12/29/2022  . Peripheral edema 09/25/2022  . Chronic heart failure with preserved ejection fraction (HCC) 09/25/2022  . Morbid obesity (HCC) 09/25/2022  . 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  . Dyslipidemia 08/06/2021  . History of CVA (cerebrovascular accident) 06/07/2021  . OSA (obstructive sleep  apnea) 11/28/2015  . COPD (chronic obstructive pulmonary disease) with chronic bronchitis (HCC) 06/26/2015  . Pulmonary hypertension (HCC) 06/26/2015  . 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 Mild intermittent asthma without complication 11/19/2014  . Essential hypertension 11/18/2014    Past Medical History:  Diagnosis Date  . Asthma   . COPD (chronic obstructive pulmonary disease) (HCC)   . GERD (gastroesophageal reflux disease)   . Hypertension   . Stroke [facility])     Past Surgical History:  Procedure Laterality Date  . PACEMAKER IMPLANT N/A 09/23/2021   Procedure: PACEMAKER IMPLANT;  Surgeon: Lanier Prude, MD;  Location: [facility] INVASIVE CV LAB;  Service: Cardiovascular;  Laterality: N/A;  . TUBAL LIGATION      Social History   Socioeconomic History  . Marital status: Divorced    Spouse name: Not on file  . Number of children: Not on file  . Years of education: Not on file  . Highest education level: Not on file  Occupational History  . Not on file  Tobacco Use  . Smoking status: Former    Current packs/day: 0.00    Average packs/day: 3.0 packs/day for 2.0 years (6.0 ttl pk-yrs)    Types: Cigarettes    Start date: 73    Quit date: 33    Years since quitting: 39.8  . Smokeless tobacco: Never  Vaping Use  . Vaping status: Never Used  Substance and Sexual Activity  . Alcohol use: No  . Drug use: No  . Sexual activity: Not on file  Other Topics Concern  . Not on file  Social History Narrative   Lives with a friend.  3 children with one deceased.  Right handed. Husband has passed away   Social Determinants of Health   Financial Resource Strain: Low Risk  (11/18/2022)   Overall Financial Resource Strain (CARDIA)   . Difficulty of Paying Living Expenses: Not hard at all  Food Insecurity: No Food Insecurity (11/18/2022)   Hunger Vital Sign   . Worried About Programme researcher, broadcasting/film/video in the Last Year: Never true   . Ran Out of Food in the Last Year: Never  true  Transportation Needs: No Transportation Needs (11/18/2022)   PRAPARE - Transportation   . Lack of Transportation (Medical): No   . Lack of Transportation (Non-Medical): No  Physical Activity: Inactive (11/18/2022)   Exercise Vital Sign   . Days of Exercise per Week: 0 days   . Minutes of Exercise per Session: 0 min  Stress: No Stress Concern Present (11/18/2022)   [employer] of Occupational Health - Occupational Stress Questionnaire   . Feeling of Stress : Not at all  Social Connections: Socially Integrated (11/18/2022)   Social Connection and Isolation Panel [NHANES]   . Frequency of Communication with Friends and Family: More than three times a week   . Frequency of Social Gatherings with Friends and Family: More than three times a week   . Attends Religious Services: More than 4 times per year   .  Active Member of Clubs or Organizations: Yes   . Attends Banker Meetings: More than 4 times per year   . Marital Status: Married  Catering manager Violence: Not At Risk (11/18/2022)   Humiliation, Afraid, Rape, and Kick questionnaire   . Fear of Current or Ex-Partner: No   . Emotionally Abused: No   . Physically Abused: No   . Sexually Abused: No    Family History  Problem Relation Age of Onset  . Cancer Mother        Lung, cervix, mouth  . Hypertension Father   . Cancer Sister        Colon  . Diabetes Sister   . Hypertension Sister   . Heart disease Sister   . Cancer Brother   . Hypertension Brother      Review of Systems  Constitutional: Negative.  Negative for chills and fever.  HENT: Negative.  Negative for congestion and sore throat.   Respiratory: Negative.  Negative for cough and shortness of breath.   Cardiovascular: Negative.  Negative for chest pain and palpitations.  Gastrointestinal:  Negative for abdominal pain, diarrhea, nausea and vomiting.       Dysphagia to solids  Genitourinary: Negative.  Negative for dysuria and hematuria.   Skin: Negative.  Negative for rash.  Neurological: Negative.  Negative for dizziness and headaches.  All other systems reviewed and are negative.   Vitals:   02/11/23 1041  BP: 134/82  Pulse: 70  Temp: 98.4 F (36.9 C)  SpO2: 90%    Physical Exam Vitals reviewed.  Constitutional:      Appearance: Normal appearance. She is obese.  HENT:     Head: Normocephalic.     Mouth/Throat:     Mouth: Mucous membranes are moist.     Pharynx: Oropharynx is clear.  Eyes:     Extraocular Movements: Extraocular movements intact.     Conjunctiva/sclera: Conjunctivae normal.     Pupils: Pupils are equal, round, and reactive to light.  Cardiovascular:     Rate and Rhythm: Normal rate and regular rhythm.     Pulses: Normal pulses.     Heart sounds: Murmur heard.  Pulmonary:     Effort: Pulmonary effort is normal.     Breath sounds: Normal breath sounds.  Abdominal:     Palpations: Abdomen is soft.     Tenderness: There is no abdominal tenderness.  Musculoskeletal:     Cervical back: No tenderness.  Lymphadenopathy:     Cervical: No cervical adenopathy.  Skin:    General: Skin is warm and dry.     Capillary Refill: Capillary refill takes less than 2 seconds.  Neurological:     General: No focal deficit present.     Mental Status: She is alert and oriented to person, place, and time.  Psychiatric:        Mood and Affect: Mood normal.        Behavior: Behavior normal.     ASSESSMENT & PLAN: A total of 42 minutes was spent with the patient and counseling/coordination of care regarding preparing for this visit, review of most recent office visit notes, review of chronic medical conditions under management, review of all medications, differential diagnosis of dysphagia including possibility of esophageal cancer, need for upper endoscopy and GI evaluation, review of today's chest x-ray report, prognosis, education on nutrition, documentation and need for follow-up.  Problem List Items  Addressed This Visit       Digestive  Esophageal dysphagia - Primary    Clinically stable. Differential diagnosis discussed including possibility of esophageal cancer Needs GI evaluation and upper endoscopy.  Referral placed today. Recommend chest x-ray and blood work today. Diet and nutrition discussed.  Advised to stay on soft diet and plenty of liquids until further assessment by GI doctor.      Relevant Orders   DG Chest 2 View   CBC with Differential/Platelet   Comprehensive metabolic panel   Lipid panel   Ambulatory referral to Gastroenterology   Patient Instructions  Dysphagia  Dysphagia is trouble swallowing. This condition occurs when solids and liquids stick in a person's throat on the way down to the stomach, or when food takes longer to get to the stomach than usual. You may have problems swallowing food, liquids, or both. You may also have pain while trying to swallow. It may take you more time and effort to swallow something. What are the causes? This condition may be caused by: Muscle problems. These may make it difficult for you to move food and liquids through the esophagus, which is the tube that connects your mouth to your stomach. Blockages. You may have ulcers, scar tissue, or inflammation that blocks the normal passage of food and liquids. Causes of these problems include: Acid reflux from your stomach into your esophagus (gastroesophageal reflux). Infections. Radiation treatment for cancer. Medicines taken without enough fluids to wash them down into your stomach. Stroke. This can affect the nerves and make it difficult to swallow. Nerve problems. These prevent signals from being sent to the muscles of your esophagus to squeeze (contract) and move what you swallow down to your stomach. Globus pharyngeus. This is a common problem that involves a feeling like something is stuck in your throat or a sense of trouble with swallowing, even though nothing is wrong  with the swallowing passages. Certain conditions, such as cerebral palsy or Parkinson's disease. What are the signs or symptoms? Common symptoms of this condition include: A feeling that solids or liquids are stuck in your throat on the way down to the stomach. Pain while swallowing. Coughing or gagging while trying to swallow. Other symptoms include: Food moving back from your stomach to your mouth (regurgitation). Noises coming from your throat. Chest discomfort when swallowing. A feeling of fullness when swallowing. Drooling, especially when the throat is blocked. Heartburn. How is this diagnosed? This condition may be diagnosed by: Barium swallow X-ray. In this test, you will swallow a white liquid that sticks to the inside of your esophagus. X-ray images are then taken. Endoscopy. In this test, a flexible telescope is inserted down your throat to look at your esophagus and your stomach. CT scans or an MRI. How is this treated? Treatment for dysphagia depends on the cause of this condition: If the dysphagia is caused by acid reflux or infection, medicines may be used. These may include antibiotics or heartburn medicines. If the dysphagia is caused by problems with the muscles, swallowing therapy may be used to help you strengthen your swallowing muscles. You may have to do specific exercises to strengthen the muscles or stretch them. If the dysphagia is caused by a blockage or mass, procedures to remove the blockage may be done. You may need surgery and a feeding tube. You may need to make diet changes. Ask your health care provider for specific instructions. Follow these instructions at home: Medicines Take over-the-counter and prescription medicines only as told by your health care provider. If you were prescribed  an antibiotic medicine, take it as told by your health care provider. Do not stop taking the antibiotic even if you start to feel better. Eating and drinking  Make any  diet changes as told by your health care provider. Work with a diet and nutrition specialist (dietitian) to create an eating plan that will help you get the nutrients you need in order to stay healthy. Eat soft foods that are easier to swallow. Cut your food into small pieces and eat slowly. Take small bites. Eat and drink only when you are sitting upright. Do not drink alcohol or caffeine. If you need help quitting, ask your health care provider. General instructions Check your weight every day to make sure you are not losing weight. Do not use any products that contain nicotine or tobacco. These products include cigarettes, chewing tobacco, and vaping devices, such as e-cigarettes. If you need help quitting, ask your health care provider. Keep all follow-up visits. This is important. Contact a health care provider if: You lose weight because you cannot swallow. You cough when you drink liquids. You cough up partially digested food. Get help right away if: You cannot swallow your saliva. You have shortness of breath, a fever, or both. Your voice is hoarse and you have trouble swallowing. These symptoms may represent a serious problem that is an emergency. Do not wait to see if the symptoms will go away. Get medical help right away. Call your local emergency services (911 in the [place] Do not drive yourself to the hospital. Summary Dysphagia is trouble swallowing. This condition occurs when solids and liquids stick in a person's throat on the way down to the stomach. You may cough or gag while trying to swallow. Dysphagia has many possible causes. Treatment for dysphagia depends on the cause of the condition. Keep all follow-up visits. This is important. This information is not intended to replace advice given to you by your health care provider. Make sure you discuss any questions you have with your health care provider. Document Revised: 11/05/2019 Document Reviewed: 11/05/2019 Elsevier  Patient Education  2024 [employer]vier Inc[employer]      Edwina Barth, MD [facility]

## 2023-02-12 ENCOUNTER — Ambulatory Visit: Payer: Medicare HMO | Admitting: Primary Care

## 2023-02-12 ENCOUNTER — Encounter: Payer: Self-pay | Admitting: Primary Care

## 2023-02-12 VITALS — BP 138/68 | HR 73 | Temp 98.3°F | Ht 66.0 in | Wt 302.2 lb

## 2023-02-12 DIAGNOSIS — J45909 Unspecified asthma, uncomplicated: Secondary | ICD-10-CM

## 2023-02-12 DIAGNOSIS — R0602 Shortness of breath: Secondary | ICD-10-CM

## 2023-02-12 LAB — BRAIN NATRIURETIC PEPTIDE: Pro B Natriuretic peptide (BNP): 47 pg/mL (ref 0.0–100.0)

## 2023-02-12 LAB — BASIC METABOLIC PANEL
BUN: 11 mg/dL (ref 6–23)
CO2: 29 meq/L (ref 19–32)
Calcium: 9.2 mg/dL (ref 8.4–10.5)
Chloride: 101 meq/L (ref 96–112)
Creatinine, Ser: 0.89 mg/dL (ref 0.40–1.20)
GFR: 61.87 mL/min (ref >60.00)
Glucose, Bld: 86 mg/dL (ref 70–99)
Potassium: 3.6 meq/L (ref 3.5–5.1)
Sodium: 136 meq/L (ref 135–145)

## 2023-02-12 MED ORDER — ADVAIR HFA 230-21 MCG/ACT IN AERO: 2.0000 | INHALATION_SPRAY | Freq: Two times a day (BID) | RESPIRATORY_TRACT | 6 refills | Status: DC

## 2023-02-12 MED ORDER — BENZONATATE 100 MG PO CAPS: 100.0000 mg | ORAL_CAPSULE | Freq: Three times a day (TID) | ORAL | 0 refills | Status: DC | PRN

## 2023-02-12 NOTE — Patient Instructions (Addendum)
Recommendations: Advair morning and evening Spiriva morning only Albuterol every 4-6 hour as needed for breakthrough shortness of breath  Continue torsemide 10mg  MWF  Orders: Labs today  PFTs prior to next visit   Follow-up: 2-3 months with Dr. Celine Mans (or first available with her)/ 1 hour PFT prior

## 2023-02-12 NOTE — Progress Notes (Signed)
@Patient  ID: Autumn Young, female    DOB: [birth year], 77 y.o.   MRN: [id number]  Chief Complaint  Patient presents with   Follow-up    Increased sob x 1-2 mths.,vomiting after most meals, cough-dry,wheezing    Referring provider: Georgina Quint, *  HPI: 77 year old, former smoker quit in 1985 (6-pack-year history). Medical history significant for COPD/asthma, OSA, hypertension, chronic heart failure with preserved ejection fraction, pulmonary hypertension, esophageal dysphagia, dyslipidemia, history of CVA, obesity.  Patient of Dr. Celine Mans.    02/12/2023 Discussed the use of AI scribe software for clinical note transcription with the patient, who gave verbal consent to proceed.  Patient presents today for a follow-up.  She was last seen by Dr. Celine Mans in May 2024 for Asthma.  During that visit she was from [facility] to Advair 2 puffs twice daily.  She was advised to continue Spiriva and use albuterol as needed.  She was started on torsemide once daily due to leg swelling and advised to follow-up with primary care. Echocardiogram in June showed normal ejection fraction, grade 1 diastolic dysfunction  She has been using Advair HFA and Spiriva inhalers. However, she has been using the Advair HFA once a day and Spiriva twice a day, contrary to the prescribed regimen. She has also been using a rescue inhaler, albuterol, three to four times a day, and a nebulizer at night, but reports that these do not provide significant relief. The patient finds the Advair HFA to be the most effective in managing her symptoms.  In addition to asthma, the patient has been managing fluid retention with Torasemide, taken as needed, approximately twice a week. She reports discomfort in the vaginal area when taking the full dose, so she has been taking half a tablet at a time. She has noticed a decrease in weight after taking the diuretic.  The patient has also been experiencing productive cough, which she  attributes to upper airway congestion. She denies having nasal congestion or postnasal drip. She has not taken any over-the-counter medications for the cough or congestion recently.  Furthermore, the patient has been experiencing issues with regurgitation after eating, particularly with foods that contain oil. She reports a sensation of food "just sitting" and a tickling in her throat, leading to coughing about an hour after eating. She is in the process of scheduling an appointment with a gastroenterologist to investigate these symptoms.       Allergies  Allergen Reactions   Shellfish Allergy Anaphylaxis   Zocor [Simvastatin] Swelling   Lasix [Furosemide] Swelling   Latex Rash   Penicillins Rash    Immunization History  Administered Date(s) Administered   Influenza-Unspecified 05/28/2015   Pneumococcal Conjugate-13 08/21/2014   Pneumococcal Polysaccharide-23 06/20/2012   Tdap 09/17/2017    Past Medical History:  Diagnosis Date   Asthma    COPD (chronic obstructive pulmonary disease) (HCC)    GERD (gastroesophageal reflux disease)    Hypertension    Stroke (HCC)     Tobacco History: Social History   Tobacco Use  Smoking Status Former   Current packs/day: 0.00   Average packs/day: 3.0 packs/day for 2.0 years (6.0 ttl pk-yrs)   Types: Cigarettes   Start date: 17   Quit date: 31   Years since quitting: 39.8  Smokeless Tobacco Never   Counseling given: Not Answered   Outpatient Medications Prior to Visit  Medication Sig Dispense Refill   acetaminophen (TYLENOL) 500 MG tablet Take 500 mg by mouth every 6 (six)  hours as needed (pain).     albuterol (PROVENTIL HFA) 108 (90 Base) MCG/ACT inhaler Inhale 2 puffs into the lungs every 6 (six) hours as needed for wheezing or shortness of breath. 18 g 0   albuterol (PROVENTIL) (2.5 MG/3ML) 0.083% nebulizer solution Take 3 mLs (2.5 mg total) by nebulization every 6 (six) hours as needed for wheezing or shortness of breath. 75  mL 12   ascorbic acid (VITAMIN C) 1000 MG tablet Take 1,000 mg by mouth daily.     Cholecalciferol 25 MCG (1000 UT) tablet Take 1,000 Units by mouth in the morning.     clopidogrel (PLAVIX) 75 MG tablet Take 75 mg by mouth every morning.     clotrimazole-betamethasone (LOTRISONE) cream Apply 1 Application topically 2 (two) times daily. 30 g 2   Cyanocobalamin (VITAMIN B-12 PO) Take 6 drops by mouth in the morning.     montelukast (SINGULAIR) 10 MG tablet Take 10 mg by mouth at bedtime.     olmesartan (BENICAR) 40 MG tablet Take 1 tablet (40 mg total) by mouth daily. 90 tablet 3   Omega-3 Fatty Acids (OMEGA-3 PO) Take 1 capsule by mouth in the morning.     pantoprazole (PROTONIX) 40 MG tablet TAKE 1 TABLET BY MOUTH DAILY BEFORE BREAKFAST 90 tablet 1   Red Yeast Rice Extract (RED YEAST RICE PO) Take 2 tablets by mouth daily.     SPIRIVA RESPIMAT 1.25 MCG/ACT AERS Inhale 1 puff into the lungs daily. 1 each 11   torsemide (DEMADEX) 20 MG tablet TAKE 1 TABLET BY MOUTH EVERY DAY 90 tablet 1   aspirin EC 81 MG tablet Take 1 tablet (81 mg total) by mouth daily. Swallow whole. (Patient not taking: Reported on 02/12/2023) 150 tablet 2   Carboxymethylcellulose Sodium (ARTIFICIAL TEARS OP) Place 1 drop into both eyes at bedtime as needed (dryness). (Patient not taking: Reported on 08/29/2022)     guaiFENesin-dextromethorphan (ROBITUSSIN DM) 100-10 MG/5ML syrup Take 5 mLs by mouth every 4 (four) hours as needed for cough. (Patient not taking: Reported on 12/29/2022) 118 mL 0   propranolol (INDERAL) 20 MG tablet TAKE 1 TABLET BY MOUTH TWICE A DAY (Patient not taking: Reported on 12/29/2022) 180 tablet 1   No facility-administered medications prior to visit.   Review of Systems  Review of Systems  HENT:  Positive for postnasal drip.   Respiratory:  Positive for cough.   Gastrointestinal:  Positive for vomiting.    Physical Exam  BP 138/68 (BP Location: Left Wrist, Cuff Size: Large)   Pulse 73   Temp  98.3 F (36.8 C) (Temporal)   Ht 5\' 6"  (1.676 m)   Wt (!) 302 lb 3.2 oz (137.1 kg)   SpO2 96%   BMI 48.78 kg/m  Physical Exam Constitutional:      Appearance: Normal appearance.  HENT:     Head: Normocephalic and atraumatic.  Cardiovascular:     Rate and Rhythm: Normal rate.  Pulmonary:     Effort: Pulmonary effort is normal.     Breath sounds: No wheezing or rhonchi.  Skin:    General: Skin is warm and dry.  Neurological:     General: No focal deficit present.     Mental Status: She is alert and oriented to person, place, and time. Mental status is at baseline.  Psychiatric:        Mood and Affect: Mood normal.        Behavior: Behavior normal.  Thought Content: Thought content normal.        Judgment: Judgment normal.      Lab Results:  CBC    Component Value Date/Time   WBC 6.7 02/11/2023 1144   RBC 4.10 02/11/2023 1144   HGB 10.9 (L) 02/11/2023 1144   HGB 11.7 09/12/2021 1207   HCT 34.4 (L) 02/11/2023 1144   HCT 35.9 09/12/2021 1207   PLT 276.0 02/11/2023 1144   PLT 284 09/12/2021 1207   MCV 83.9 02/11/2023 1144   MCV 88 09/12/2021 1207   MCH 27.4 07/22/2022 2031   MCHC 31.7 02/11/2023 1144   RDW 14.4 02/11/2023 1144   RDW 13.2 09/12/2021 1207   LYMPHSABS 1.6 02/11/2023 1144   LYMPHSABS 2.0 09/12/2021 1207   MONOABS 0.6 02/11/2023 1144   EOSABS 0.1 02/11/2023 1144   EOSABS 0.1 09/12/2021 1207   BASOSABS 0.0 02/11/2023 1144   BASOSABS 0.0 09/12/2021 1207    BMET    Component Value Date/Time   NA 139 02/11/2023 1144   NA 138 09/12/2021 1207   K 3.8 02/11/2023 1144   CL 101 02/11/2023 1144   CO2 30 02/11/2023 1144   GLUCOSE 99 02/11/2023 1144   BUN 8 02/11/2023 1144   BUN 15 09/12/2021 1207   CREATININE 0.80 02/11/2023 1144   CALCIUM 9.2 02/11/2023 1144   GFRNONAA >60 07/25/2022 0358   GFRAA >60 08/13/2017 0606    BNP    Component Value Date/Time   BNP 68.0 07/22/2022 2032    ProBNP    Component Value Date/Time   PROBNP 62.0  08/29/2022 0911    Imaging: DG Chest 2 View  Result Date: 02/11/2023 CLINICAL DATA:  Chest pain, shortness of breath, cough. EXAM: CHEST - 2 VIEW COMPARISON:  July 26, 2022. FINDINGS: Stable cardiomegaly with mild central pulmonary vascular congestion. Left-sided pacemaker is unchanged. Lungs are clear. Bony thorax is unremarkable. IMPRESSION: Stable cardiomegaly with mild central pulmonary vascular congestion. Electronically Signed   By: Lupita Raider M.D.   On: 02/11/2023 14:24     Assessment & Plan:    Asthma Incorrect use of inhalers. Using Advair HFA once daily and Spiriva twice daily. Frequent use of Albuterol rescue inhaler without significant relief. -Advair HFA 230-22mcg two puffs to be used twice daily. -Spiriva Respimat 1.8mcg to be used once daily. -Order Pulmonary Function Test to assess lung function.  Congestive Heart Failure Mild vascular congestion on recent chest x-ray. Patient taking Torsemide 20mg  as needed, approximately twice weekly, due to discomfort with urination. -Increase Torsemide to three times weekly (Monday, Wednesday, Friday). -Order labs to assess heart function and fluid status.  Gastrointestinal Regurgitation and coughing after meals, particularly with oily foods. -Continue with planned GI consult for further evaluation.      Follow-up: 2-3 months with Dr. Celine Mans (or first available with her)/ 1 hour PFT prior   Glenford Bayley, NP 02/12/2023

## 2023-02-17 ENCOUNTER — Encounter: Payer: Self-pay | Admitting: Emergency Medicine

## 2023-02-24 ENCOUNTER — Encounter: Payer: Self-pay | Admitting: Gastroenterology

## 2023-02-28 ENCOUNTER — Other Ambulatory Visit: Payer: Self-pay | Admitting: Cardiology

## 2023-03-04 ENCOUNTER — Other Ambulatory Visit: Payer: Self-pay

## 2023-03-04 MED ORDER — CLOPIDOGREL BISULFATE 75 MG PO TABS: 75.0000 mg | ORAL_TABLET | Freq: Every morning | ORAL | 0 refills | Status: DC

## 2023-03-24 ENCOUNTER — Ambulatory Visit (INDEPENDENT_AMBULATORY_CARE_PROVIDER_SITE_OTHER): Payer: Medicare HMO

## 2023-03-24 DIAGNOSIS — I442 Atrioventricular block, complete: Secondary | ICD-10-CM

## 2023-03-26 LAB — CUP PACEART REMOTE DEVICE CHECK
Date Time Interrogation Session: 20241224071612
Implantable Lead Connection Status: 753985
Implantable Lead Connection Status: 753985
Implantable Lead Implant Date: 20230626
Implantable Lead Implant Date: 20230626
Implantable Lead Location: 753859
Implantable Lead Location: 753860
Implantable Lead Model: 377
Implantable Lead Model: 377
Implantable Lead Serial Number: 8000898701
Implantable Lead Serial Number: 8000923303
Implantable Pulse Generator Implant Date: 20230626
Pulse Gen Model: 407145
Pulse Gen Serial Number: 70433047

## 2023-03-30 ENCOUNTER — Ambulatory Visit: Payer: Medicare HMO | Admitting: Emergency Medicine

## 2023-03-31 ENCOUNTER — Inpatient Hospital Stay (HOSPITAL_BASED_OUTPATIENT_CLINIC_OR_DEPARTMENT_OTHER)
Admission: EM | Admit: 2023-03-31 | Discharge: 2023-04-07 | DRG: 190 | Disposition: A | Discharge status: Home / Self Care | Payer: Medicare HMO | Attending: Internal Medicine | Admitting: Internal Medicine

## 2023-03-31 ENCOUNTER — Emergency Department (HOSPITAL_BASED_OUTPATIENT_CLINIC_OR_DEPARTMENT_OTHER): Payer: Medicare HMO

## 2023-03-31 ENCOUNTER — Encounter (HOSPITAL_BASED_OUTPATIENT_CLINIC_OR_DEPARTMENT_OTHER): Payer: Self-pay

## 2023-03-31 ENCOUNTER — Other Ambulatory Visit: Payer: Self-pay

## 2023-03-31 DIAGNOSIS — J441 Chronic obstructive pulmonary disease with (acute) exacerbation: Principal | ICD-10-CM | POA: Diagnosis present

## 2023-03-31 DIAGNOSIS — Z8249 Family history of ischemic heart disease and other diseases of the circulatory system: Secondary | ICD-10-CM

## 2023-03-31 DIAGNOSIS — Z6841 Body Mass Index (BMI) 40.0 and over, adult: Secondary | ICD-10-CM

## 2023-03-31 DIAGNOSIS — I5032 Chronic diastolic (congestive) heart failure: Secondary | ICD-10-CM | POA: Diagnosis present

## 2023-03-31 DIAGNOSIS — Z91013 Allergy to seafood: Secondary | ICD-10-CM

## 2023-03-31 DIAGNOSIS — Z7902 Long term (current) use of antithrombotics/antiplatelets: Secondary | ICD-10-CM

## 2023-03-31 DIAGNOSIS — I5033 Acute on chronic diastolic (congestive) heart failure: Secondary | ICD-10-CM | POA: Diagnosis not present

## 2023-03-31 DIAGNOSIS — Z79899 Other long term (current) drug therapy: Secondary | ICD-10-CM

## 2023-03-31 DIAGNOSIS — Z8673 Personal history of transient ischemic attack (TIA), and cerebral infarction without residual deficits: Secondary | ICD-10-CM

## 2023-03-31 DIAGNOSIS — Z87891 Personal history of nicotine dependence: Secondary | ICD-10-CM

## 2023-03-31 DIAGNOSIS — Z833 Family history of diabetes mellitus: Secondary | ICD-10-CM

## 2023-03-31 DIAGNOSIS — R509 Fever, unspecified: Secondary | ICD-10-CM | POA: Diagnosis not present

## 2023-03-31 DIAGNOSIS — D649 Anemia, unspecified: Secondary | ICD-10-CM | POA: Diagnosis present

## 2023-03-31 DIAGNOSIS — G4733 Obstructive sleep apnea (adult) (pediatric): Secondary | ICD-10-CM | POA: Diagnosis present

## 2023-03-31 DIAGNOSIS — R600 Localized edema: Secondary | ICD-10-CM

## 2023-03-31 DIAGNOSIS — Z888 Allergy status to other drugs, medicaments and biological substances status: Secondary | ICD-10-CM

## 2023-03-31 DIAGNOSIS — Z880 Allergy status to penicillin: Secondary | ICD-10-CM

## 2023-03-31 DIAGNOSIS — R0902 Hypoxemia: Secondary | ICD-10-CM

## 2023-03-31 DIAGNOSIS — Z91040 Latex allergy status: Secondary | ICD-10-CM

## 2023-03-31 DIAGNOSIS — I10 Essential (primary) hypertension: Secondary | ICD-10-CM | POA: Diagnosis present

## 2023-03-31 DIAGNOSIS — K219 Gastro-esophageal reflux disease without esophagitis: Secondary | ICD-10-CM | POA: Diagnosis present

## 2023-03-31 DIAGNOSIS — Z1152 Encounter for screening for COVID-19: Secondary | ICD-10-CM

## 2023-03-31 DIAGNOSIS — I110 Hypertensive heart disease with heart failure: Secondary | ICD-10-CM | POA: Diagnosis present

## 2023-03-31 DIAGNOSIS — J9601 Acute respiratory failure with hypoxia: Secondary | ICD-10-CM | POA: Diagnosis present

## 2023-03-31 DIAGNOSIS — Z950 Presence of cardiac pacemaker: Secondary | ICD-10-CM | POA: Diagnosis not present

## 2023-03-31 DIAGNOSIS — R0602 Shortness of breath: Secondary | ICD-10-CM | POA: Diagnosis not present

## 2023-03-31 DIAGNOSIS — Z7951 Long term (current) use of inhaled steroids: Secondary | ICD-10-CM

## 2023-03-31 DIAGNOSIS — E785 Hyperlipidemia, unspecified: Secondary | ICD-10-CM | POA: Diagnosis present

## 2023-03-31 DIAGNOSIS — E876 Hypokalemia: Secondary | ICD-10-CM | POA: Diagnosis present

## 2023-03-31 DIAGNOSIS — I517 Cardiomegaly: Secondary | ICD-10-CM | POA: Diagnosis not present

## 2023-03-31 LAB — I-STAT VENOUS BLOOD GAS, ED
Acid-Base Excess: 4 mmol/L — ABNORMAL HIGH (ref 0.0–2.0)
Bicarbonate: 29.2 mmol/L — ABNORMAL HIGH (ref 20.0–28.0)
Calcium, Ion: 1.09 mmol/L — ABNORMAL LOW (ref 1.15–1.40)
HCT: 33 % — ABNORMAL LOW (ref 36.0–46.0)
Hemoglobin: 11.2 g/dL — ABNORMAL LOW (ref 12.0–15.0)
O2 Saturation: 79 %
Patient temperature: 100
Potassium: 3.3 mmol/L — ABNORMAL LOW (ref 3.5–5.1)
Sodium: 140 mmol/L (ref 135–145)
TCO2: 31 mmol/L (ref 22–32)
pCO2, Ven: 49.1 mm[Hg] (ref 44–60)
pH, Ven: 7.385 (ref 7.25–7.43)
pO2, Ven: 46 mm[Hg] — ABNORMAL HIGH (ref 32–45)

## 2023-03-31 LAB — CBC
HCT: 32.4 % — ABNORMAL LOW (ref 36.0–46.0)
Hemoglobin: 9.9 g/dL — ABNORMAL LOW (ref 12.0–15.0)
MCH: 25.7 pg — ABNORMAL LOW (ref 26.0–34.0)
MCHC: 30.6 g/dL (ref 30.0–36.0)
MCV: 84.2 fL (ref 80.0–100.0)
Platelets: 234 10*3/uL (ref 150–400)
RBC: 3.85 MIL/uL — ABNORMAL LOW (ref 3.87–5.11)
RDW: 14.1 % (ref 11.5–15.5)
WBC: 5 10*3/uL (ref 4.0–10.5)
nRBC: 0 % (ref 0.0–0.2)

## 2023-03-31 LAB — BASIC METABOLIC PANEL
Anion gap: 8 (ref 5–15)
BUN: 11 mg/dL (ref 8–23)
CO2: 25 mmol/L (ref 22–32)
Calcium: 8.2 mg/dL — ABNORMAL LOW (ref 8.9–10.3)
Chloride: 103 mmol/L (ref 98–111)
Creatinine, Ser: 0.94 mg/dL (ref 0.44–1.00)
GFR, Estimated: >60 mL/min (ref >60)
Glucose, Bld: 108 mg/dL — ABNORMAL HIGH (ref 70–99)
Potassium: 3.2 mmol/L — ABNORMAL LOW (ref 3.5–5.1)
Sodium: 136 mmol/L (ref 135–145)

## 2023-03-31 LAB — RESP PANEL BY RT-PCR (RSV, FLU A&B, COVID)  RVPGX2
Influenza A by PCR: NEGATIVE
Influenza B by PCR: NEGATIVE
Resp Syncytial Virus by PCR: NEGATIVE
SARS Coronavirus 2 by RT PCR: NEGATIVE

## 2023-03-31 MED ORDER — IPRATROPIUM-ALBUTEROL 0.5-2.5 (3) MG/3ML IN SOLN
3.0000 mL | RESPIRATORY_TRACT | Status: DC
  Administered 2023-03-31 – 2023-04-01 (×7): 3 mL via RESPIRATORY_TRACT
  Filled 2023-03-31 (×7): qty 3

## 2023-03-31 MED ORDER — CLOPIDOGREL BISULFATE 75 MG PO TABS
75.0000 mg | ORAL_TABLET | Freq: Every morning | ORAL | Status: DC
  Administered 2023-04-01 – 2023-04-07 (×7): 75 mg via ORAL
  Filled 2023-03-31 (×7): qty 1

## 2023-03-31 MED ORDER — METHYLPREDNISOLONE SODIUM SUCC 40 MG IJ SOLR
40.0000 mg | Freq: Two times a day (BID) | INTRAMUSCULAR | Status: AC
  Administered 2023-04-01 (×2): 40 mg via INTRAVENOUS
  Filled 2023-03-31 (×2): qty 1

## 2023-03-31 MED ORDER — ALBUTEROL SULFATE (2.5 MG/3ML) 0.083% IN NEBU
2.5000 mg | INHALATION_SOLUTION | Freq: Once | RESPIRATORY_TRACT | Status: AC
  Administered 2023-03-31: 2.5 mg via RESPIRATORY_TRACT
  Filled 2023-03-31: qty 3

## 2023-03-31 MED ORDER — ALBUTEROL SULFATE (2.5 MG/3ML) 0.083% IN NEBU
2.5000 mg | INHALATION_SOLUTION | RESPIRATORY_TRACT | Status: DC | PRN
  Administered 2023-04-01: 2.5 mg via RESPIRATORY_TRACT
  Filled 2023-03-31: qty 3

## 2023-03-31 MED ORDER — METHYLPREDNISOLONE SODIUM SUCC 125 MG IJ SOLR
125.0000 mg | Freq: Once | INTRAMUSCULAR | Status: AC
  Administered 2023-03-31: 125 mg via INTRAVENOUS
  Filled 2023-03-31: qty 2

## 2023-03-31 MED ORDER — IPRATROPIUM BROMIDE 0.02 % IN SOLN: 0.5000 mg | Freq: Four times a day (QID) | RESPIRATORY_TRACT | Status: DC

## 2023-03-31 MED ORDER — IRBESARTAN 150 MG PO TABS
300.0000 mg | ORAL_TABLET | Freq: Every day | ORAL | Status: DC
Start: 2023-03-31 — End: 2023-04-07
  Administered 2023-03-31 – 2023-04-07 (×8): 300 mg via ORAL
  Filled 2023-03-31 (×8): qty 2

## 2023-03-31 MED ORDER — MAGNESIUM SULFATE 2 GM/50ML IV SOLN
2.0000 g | Freq: Once | INTRAVENOUS | Status: AC
  Administered 2023-03-31: 2 g via INTRAVENOUS
  Filled 2023-03-31: qty 50

## 2023-03-31 MED ORDER — MOMETASONE FURO-FORMOTEROL FUM 200-5 MCG/ACT IN AERO: 2.0000 | INHALATION_SPRAY | Freq: Two times a day (BID) | RESPIRATORY_TRACT | Status: DC

## 2023-03-31 MED ORDER — MONTELUKAST SODIUM 10 MG PO TABS: 10.0000 mg | ORAL_TABLET | Freq: Every day | ORAL | Status: DC

## 2023-03-31 MED ORDER — PANTOPRAZOLE SODIUM 40 MG PO TBEC
40.0000 mg | DELAYED_RELEASE_TABLET | Freq: Every day | ORAL | Status: DC
  Administered 2023-04-01 – 2023-04-07 (×7): 40 mg via ORAL
  Filled 2023-03-31 (×7): qty 1

## 2023-03-31 MED ORDER — PREDNISONE 20 MG PO TABS
40.0000 mg | ORAL_TABLET | Freq: Every day | ORAL | Status: DC
  Administered 2023-04-02 – 2023-04-03 (×2): 40 mg via ORAL
  Filled 2023-03-31 (×2): qty 2

## 2023-03-31 MED ORDER — IPRATROPIUM BROMIDE 0.02 % IN SOLN
1.0000 mg | Freq: Once | RESPIRATORY_TRACT | Status: AC
  Administered 2023-03-31: 1 mg via RESPIRATORY_TRACT
  Filled 2023-03-31: qty 5

## 2023-03-31 MED ORDER — ALBUTEROL (5 MG/ML) CONTINUOUS INHALATION SOLN
15.0000 mg/h | INHALATION_SOLUTION | Freq: Once | RESPIRATORY_TRACT | Status: AC
  Administered 2023-03-31: 15 mg/h via RESPIRATORY_TRACT

## 2023-03-31 MED ORDER — TORSEMIDE 20 MG PO TABS: 20.0000 mg | ORAL_TABLET | ORAL | Status: DC

## 2023-03-31 MED ORDER — POTASSIUM CHLORIDE CRYS ER 20 MEQ PO TBCR
40.0000 meq | EXTENDED_RELEASE_TABLET | Freq: Once | ORAL | Status: AC
  Administered 2023-04-01: 40 meq via ORAL
  Filled 2023-03-31: qty 2

## 2023-03-31 MED ORDER — IPRATROPIUM-ALBUTEROL 0.5-2.5 (3) MG/3ML IN SOLN
3.0000 mL | Freq: Once | RESPIRATORY_TRACT | Status: AC
  Administered 2023-03-31: 3 mL via RESPIRATORY_TRACT
  Filled 2023-03-31: qty 3

## 2023-03-31 MED ORDER — ARFORMOTEROL TARTRATE 15 MCG/2ML IN NEBU
15.0000 ug | INHALATION_SOLUTION | Freq: Two times a day (BID) | RESPIRATORY_TRACT | Status: DC
  Administered 2023-04-01: 15 ug via RESPIRATORY_TRACT
  Filled 2023-03-31: qty 2

## 2023-03-31 NOTE — ED Triage Notes (Signed)
Pt arrives with c/o SOB that started about 2 weeks ago, but worsened today. Pt has hx of COPD. Pt denies CP.

## 2023-03-31 NOTE — ED Provider Notes (Signed)
 [place] [facility] Provider Note   CSN: [id number] Arrival date & time: 03/31/23  8091     History  Chief Complaint  Patient presents with   Shortness of Breath    Autumn Young is a 77 y.o. female with history of COPD presented to ED with complaint of shortness of breath.  Patient ports symptoms been ongoing for about a week.  She reports her granddaughter who lives with her is had similar viral URI type symptoms, coughing and shortness of breath.  Patient reports objective fevers at home.  She says she has been giving her self breathing treatments with her nebulizer machine about once every 2 hours.  She is not currently on steroids or antibiotics.  HPI     Home Medications Prior to Admission medications   Medication Sig Start Date End Date Taking? Authorizing Provider  acetaminophen  (TYLENOL ) 500 MG tablet Take 500 mg by mouth every 6 (six) hours as needed (pain).    [provider]  ADVAIR  HFA 230-21 MCG/ACT inhaler Inhale 2 puffs into the lungs 2 (two) times daily. 02/12/23   Hope Almarie ORN, NP  albuterol  (PROVENTIL  HFA) [phone] Base) MCG/ACT inhaler Inhale 2 puffs into the lungs every 6 (six) hours as needed for wheezing or shortness of breath. 07/26/22   Trixie Nilda HERO, MD  albuterol  (PROVENTIL ) (2.5 MG/3ML) 0.083% nebulizer solution Take 3 mLs (2.5 mg total) by nebulization every 6 (six) hours as needed for wheezing or shortness of breath. 08/29/22   Desai, Nikita S, MD  ascorbic acid (VITAMIN C) 1000 MG tablet Take 1,000 mg by mouth daily. 05/14/18   [provider]  aspirin  EC 81 MG tablet Take 1 tablet (81 mg total) by mouth daily. Swallow whole. Patient not taking: Reported on 02/12/2023 07/26/22 07/26/23  Gherghe, Costin M, MD  benzonatate  (TESSALON  PERLES) 100 MG capsule Take 1 capsule (100 mg total) by mouth 3 (three) times daily as needed for cough. 02/12/23   Hope Almarie ORN, NP  Carboxymethylcellulose  Sodium (ARTIFICIAL TEARS OP) Place 1 drop into both eyes at bedtime as needed (dryness). Patient not taking: Reported on 08/29/2022    [provider]  Cholecalciferol 25 MCG (1000 UT) tablet Take 1,000 Units by mouth in the morning.    [provider]  clopidogrel  (PLAVIX ) 75 MG tablet Take 1 tablet (75 mg total) by mouth every morning. 03/04/23   Cindie Ole DASEN, MD  clotrimazole -betamethasone  (LOTRISONE ) cream Apply 1 Application topically 2 (two) times daily. 12/29/22   Purcell Emil Schanz, MD  Cyanocobalamin  (VITAMIN B-12 PO) Take 6 drops by mouth in the morning.    [provider]  guaiFENesin -dextromethorphan  (ROBITUSSIN DM) 100-10 MG/5ML syrup Take 5 mLs by mouth every 4 (four) hours as needed for cough. Patient not taking: Reported on 12/29/2022 07/26/22   Gherghe, Costin M, MD  montelukast  (SINGULAIR ) 10 MG tablet Take 10 mg by mouth at bedtime. 03/26/21   [provider]  olmesartan  (BENICAR ) 40 MG tablet Take 1 tablet (40 mg total) by mouth daily. 12/29/22   Purcell Emil Schanz, MD  Omega-3 Fatty Acids (OMEGA-3 PO) Take 1 capsule by mouth in the morning.    [provider]  pantoprazole  (PROTONIX ) 40 MG tablet TAKE 1 TABLET BY MOUTH DAILY BEFORE BREAKFAST 01/27/23   Purcell Emil Schanz, MD  propranolol  (INDERAL ) 20 MG tablet TAKE 1 TABLET BY MOUTH TWICE A DAY Patient not taking: Reported on 12/29/2022 11/03/22   Cindie Ole  T, MD  Red Yeast Rice Extract (RED YEAST RICE PO) Take 2 tablets by mouth daily.    [provider]  SPIRIVA  RESPIMAT 1.25 MCG/ACT AERS Inhale 1 puff into the lungs daily. 08/29/22   Meade Verdon RAMAN, MD  torsemide  (DEMADEX ) 20 MG tablet TAKE 1 TABLET BY MOUTH EVERY DAY 12/29/22   Purcell Emil Schanz, MD      Allergies    Shellfish allergy, Zocor [simvastatin], Lasix [furosemide], Latex, and Penicillins    Review of Systems   Review of Systems  Physical Exam Updated Vital Signs BP (!) 174/78   Pulse  69   Temp 97.9 F (36.6 C) (Oral)   Resp 20   Wt 120.2 kg   SpO2 96%   BMI 42.77 kg/m  Physical Exam Constitutional:      General: She is not in acute distress.    Appearance: She is obese.  HENT:     Head: Normocephalic and atraumatic.  Eyes:     Conjunctiva/sclera: Conjunctivae normal.     Pupils: Pupils are equal, round, and reactive to light.  Cardiovascular:     Rate and Rhythm: Normal rate and regular rhythm.  Pulmonary:     Effort: Pulmonary effort is normal. No respiratory distress.     Comments: Speaking in full sentences, diffuse bilateral expiratory wheezing Abdominal:     General: There is no distension.     Tenderness: There is no abdominal tenderness.  Skin:    General: Skin is warm and dry.  Neurological:     General: No focal deficit present.     Mental Status: She is alert. Mental status is at baseline.  Psychiatric:        Mood and Affect: Mood normal.        Behavior: Behavior normal.     ED Results / Procedures / Treatments   Labs (all labs ordered are listed, but only abnormal results are displayed) Labs Reviewed  BASIC METABOLIC PANEL - Abnormal; Notable for the following components:      Result Value   Potassium 3.2 (*)    Glucose, Bld 108 (*)    Calcium 8.2 (*)    All other components within normal limits  CBC - Abnormal; Notable for the following components:   RBC 3.85 (*)    Hemoglobin 9.9 (*)    HCT 32.4 (*)    MCH 25.7 (*)    All other components within normal limits  I-STAT VENOUS BLOOD GAS, ED - Abnormal; Notable for the following components:   pO2, Ven 46 (*)    Bicarbonate 29.2 (*)    Acid-Base Excess 4.0 (*)    Potassium 3.3 (*)    Calcium, Ion 1.09 (*)    HCT 33.0 (*)    Hemoglobin 11.2 (*)    All other components within normal limits  RESP PANEL BY RT-PCR (RSV, FLU A&B, COVID)  [id number]    EKG EKG Interpretation Date/Time:  Tuesday March 31 2023 19:27:47 EST Ventricular Rate:  80 PR Interval:  156 QRS  Duration:  90 QT Interval:  386 QTC Calculation: 446 R Axis:   16  Text Interpretation: Sinus rhythm Probable left atrial enlargement Low voltage, precordial leads Confirmed by Cottie Cough [phone]) on 04/01/2023 12:36:54 PM  Radiology DG Chest Portable 1 View Result Date: 03/31/2023 CLINICAL DATA:  Shortness of breath and fevers for 2 weeks, initial encounter EXAM: PORTABLE CHEST 1 VIEW COMPARISON:  02/11/2019 FINDINGS: Cardiac shadow is enlarged. Pacemaker is again identified and stable.  The lungs are clear bilaterally. No bony abnormality is noted. IMPRESSION: No active disease. Electronically Signed   By: Oneil Devonshire M.D.   On: 03/31/2023 22:16    Procedures .Critical Care  Performed by: Cottie Donnice PARAS, MD Authorized by: Cottie Donnice PARAS, MD   Critical care provider statement:    Critical care time (minutes):  30   Critical care time was exclusive of:  Separately billable procedures and treating other patients   Critical care was necessary to treat or prevent imminent or life-threatening deterioration of the following conditions:  Respiratory failure   Critical care was time spent personally by me on the following activities:  Ordering and performing treatments and interventions, ordering and review of laboratory studies, ordering and review of radiographic studies, pulse oximetry, review of old charts, examination of patient and evaluation of patient's response to treatment Comments:     COPD exacerbation     Medications Ordered in ED Medications  ipratropium-albuterol  (DUONEB) 0.5-2.5 (3) MG/3ML nebulizer solution 3 mL (3 mLs Nebulization Given 04/01/23 1203)  clopidogrel  (PLAVIX ) tablet 75 mg (75 mg Oral Given 04/01/23 1134)  irbesartan  (AVAPRO ) tablet 300 mg (300 mg Oral Given 04/01/23 1135)  pantoprazole  (PROTONIX ) EC tablet 40 mg (40 mg Oral Given 04/01/23 0738)  albuterol  (PROVENTIL ) (2.5 MG/3ML) 0.083% nebulizer solution 2.5 mg (2.5 mg Nebulization Given 04/01/23 1203)   methylPREDNISolone  sodium succinate (SOLU-MEDROL ) 40 mg/mL injection 40 mg (40 mg Intravenous Given 04/01/23 0738)    Followed by  predniSONE  (DELTASONE ) tablet 40 mg (has no administration in time range)  mometasone -formoterol  (DULERA ) 200-5 MCG/ACT inhaler 2 puff (has no administration in time range)  ipratropium (ATROVENT ) nebulizer solution 0.5 mg (0.5 mg Nebulization Not Given 04/01/23 0744)  arformoterol  (BROVANA ) nebulizer solution 15 mcg (has no administration in time range)  ipratropium-albuterol  (DUONEB) 0.5-2.5 (3) MG/3ML nebulizer solution 3 mL (3 mLs Nebulization Given 03/31/23 1927)  albuterol  (PROVENTIL ) (2.5 MG/3ML) 0.083% nebulizer solution 2.5 mg (2.5 mg Nebulization Given 03/31/23 1927)  albuterol  (PROVENTIL ,VENTOLIN ) solution continuous neb (15 mg/hr Nebulization Given 03/31/23 1933)  ipratropium (ATROVENT ) nebulizer solution 1 mg (1 mg Nebulization Given 03/31/23 1933)  magnesium  sulfate IVPB 2 g 50 mL (0 g Intravenous Stopped 03/31/23 2143)  methylPREDNISolone  sodium succinate (SOLU-MEDROL ) 125 mg/2 mL injection 125 mg (125 mg Intravenous Given 03/31/23 2020)  potassium chloride  SA (KLOR-CON  M) CR tablet 40 mEq (40 mEq Oral Given 04/01/23 0029)    ED Course/ Medical Decision Making/ A&P Clinical Course as of 04/01/23 1238  Tue Mar 31, 2023  2131 Patient reassessed, subjectively has some better feeling but is still having significant wheezing.  She is 80% on room air now requiring 2 L nasal cannula.  At this point she will require admission for COPD exacerbation likely secondary to viral illness.  Of note, her granddaughter who is a patient currently in the ED and lives with her tested positive for influenza.  Therefore I suspect it is highly likely this patient may also have influenza, with a false negative test result.  I do not see evidence of bacterial infection and there is no role for antibiotics at this time. [MT]  2132 Awaiting xray chest [MT]    Clinical Course User  Index [MT] Dalissa Lovin, Donnice PARAS, MD                                 Medical Decision Making Amount and/or Complexity of Data Reviewed Labs: ordered. Radiology:  ordered.  Risk Prescription drug management. Decision regarding hospitalization.   This patient presents to the ED with concern for shortness of breath, wheezing. This involves an extensive number of treatment options, and is a complaint that carries with it a high risk of complications and morbidity.  The differential diagnosis includes COPD exacerbation may be related to viral illness versus bacterial infection versus other  Co-morbidities that complicate the patient evaluation: History of COPD at high risk of exacerbation   I ordered and personally interpreted labs.  The pertinent results include:  VBG with normal PH, pCO2 49.  BMP with K 3.2.  Covid flu negative.  WBC wnl.  I ordered imaging studies including x-ray of the chest I independently visualized and interpreted imaging which showed no acute abnormalities I agree with the radiologist interpretation  The patient was maintained on a cardiac monitor.  I personally viewed and interpreted the cardiac monitored which showed an underlying rhythm of: NSR  Per my interpretation the patient's ECG shows NSR with no acute ischemic findings  I ordered medication including continuous nebulizers, IV magnesium , IV steroids for suspected COPD exacerbation  I have reviewed the patients home medicines and have made adjustments as needed  Test Considered: low suspicion for acute PE  After the interventions noted above, I reevaluated the patient and found that they have: stayed the same   Dispostion:  After consideration of the diagnostic results and the patients response to treatment, I feel that the patent would benefit from medical admission.  Strong suspicion for viral URI and potential influenza given her sick granddaughter in the ED was diagnosed with this, and they live  together.  However, given her GI upset issues and onset of symptoms several days ago, I would avoid tamiflu.         Final Clinical Impression(s) / ED Diagnoses Final diagnoses:  COPD exacerbation (HCC)  Hypoxia    Rx / DC Orders ED Discharge Orders     None         Carmel Waddington, Donnice PARAS, MD 04/01/23 1238

## 2023-04-01 ENCOUNTER — Other Ambulatory Visit: Payer: Self-pay | Admitting: Cardiology

## 2023-04-01 DIAGNOSIS — R600 Localized edema: Secondary | ICD-10-CM | POA: Diagnosis not present

## 2023-04-01 DIAGNOSIS — Z79899 Other long term (current) drug therapy: Secondary | ICD-10-CM | POA: Diagnosis not present

## 2023-04-01 DIAGNOSIS — Z8249 Family history of ischemic heart disease and other diseases of the circulatory system: Secondary | ICD-10-CM | POA: Diagnosis not present

## 2023-04-01 DIAGNOSIS — R0602 Shortness of breath: Secondary | ICD-10-CM | POA: Diagnosis not present

## 2023-04-01 DIAGNOSIS — Z7951 Long term (current) use of inhaled steroids: Secondary | ICD-10-CM | POA: Diagnosis not present

## 2023-04-01 DIAGNOSIS — Z91040 Latex allergy status: Secondary | ICD-10-CM | POA: Diagnosis not present

## 2023-04-01 DIAGNOSIS — Z7902 Long term (current) use of antithrombotics/antiplatelets: Secondary | ICD-10-CM | POA: Diagnosis not present

## 2023-04-01 DIAGNOSIS — Z1152 Encounter for screening for COVID-19: Secondary | ICD-10-CM | POA: Diagnosis not present

## 2023-04-01 DIAGNOSIS — Z87891 Personal history of nicotine dependence: Secondary | ICD-10-CM | POA: Diagnosis not present

## 2023-04-01 DIAGNOSIS — Z880 Allergy status to penicillin: Secondary | ICD-10-CM | POA: Diagnosis not present

## 2023-04-01 DIAGNOSIS — K219 Gastro-esophageal reflux disease without esophagitis: Secondary | ICD-10-CM | POA: Diagnosis present

## 2023-04-01 DIAGNOSIS — I5033 Acute on chronic diastolic (congestive) heart failure: Secondary | ICD-10-CM | POA: Diagnosis not present

## 2023-04-01 DIAGNOSIS — R0902 Hypoxemia: Secondary | ICD-10-CM | POA: Diagnosis not present

## 2023-04-01 DIAGNOSIS — I110 Hypertensive heart disease with heart failure: Secondary | ICD-10-CM | POA: Diagnosis not present

## 2023-04-01 DIAGNOSIS — E785 Hyperlipidemia, unspecified: Secondary | ICD-10-CM | POA: Diagnosis not present

## 2023-04-01 DIAGNOSIS — Z8673 Personal history of transient ischemic attack (TIA), and cerebral infarction without residual deficits: Secondary | ICD-10-CM | POA: Diagnosis not present

## 2023-04-01 DIAGNOSIS — Z888 Allergy status to other drugs, medicaments and biological substances status: Secondary | ICD-10-CM | POA: Diagnosis not present

## 2023-04-01 DIAGNOSIS — I10 Essential (primary) hypertension: Secondary | ICD-10-CM | POA: Diagnosis not present

## 2023-04-01 DIAGNOSIS — J441 Chronic obstructive pulmonary disease with (acute) exacerbation: Secondary | ICD-10-CM | POA: Diagnosis not present

## 2023-04-01 DIAGNOSIS — Z833 Family history of diabetes mellitus: Secondary | ICD-10-CM | POA: Diagnosis not present

## 2023-04-01 DIAGNOSIS — Z91013 Allergy to seafood: Secondary | ICD-10-CM | POA: Diagnosis not present

## 2023-04-01 DIAGNOSIS — Z950 Presence of cardiac pacemaker: Secondary | ICD-10-CM | POA: Diagnosis not present

## 2023-04-01 DIAGNOSIS — J9601 Acute respiratory failure with hypoxia: Secondary | ICD-10-CM | POA: Diagnosis not present

## 2023-04-01 DIAGNOSIS — G4733 Obstructive sleep apnea (adult) (pediatric): Secondary | ICD-10-CM | POA: Diagnosis not present

## 2023-04-01 DIAGNOSIS — R079 Chest pain, unspecified: Secondary | ICD-10-CM | POA: Diagnosis not present

## 2023-04-01 DIAGNOSIS — Z6841 Body Mass Index (BMI) 40.0 and over, adult: Secondary | ICD-10-CM | POA: Diagnosis not present

## 2023-04-01 DIAGNOSIS — R0989 Other specified symptoms and signs involving the circulatory and respiratory systems: Secondary | ICD-10-CM | POA: Diagnosis not present

## 2023-04-01 DIAGNOSIS — D649 Anemia, unspecified: Secondary | ICD-10-CM | POA: Diagnosis not present

## 2023-04-01 DIAGNOSIS — E876 Hypokalemia: Secondary | ICD-10-CM | POA: Diagnosis present

## 2023-04-01 MED ORDER — ONDANSETRON HCL 4 MG PO TABS: 4.0000 mg | ORAL_TABLET | Freq: Four times a day (QID) | ORAL | Status: DC | PRN

## 2023-04-01 MED ORDER — ENOXAPARIN SODIUM 40 MG/0.4ML IJ SOSY: 40.0000 mg | PREFILLED_SYRINGE | INTRAMUSCULAR | Status: DC

## 2023-04-01 MED ORDER — HYDRALAZINE HCL 20 MG/ML IJ SOLN
10.0000 mg | Freq: Once | INTRAMUSCULAR | Status: AC
  Administered 2023-04-01: 10 mg via INTRAVENOUS
  Filled 2023-04-01: qty 1

## 2023-04-01 MED ORDER — ONDANSETRON HCL 4 MG/2ML IJ SOLN: 4.0000 mg | Freq: Four times a day (QID) | INTRAMUSCULAR | Status: DC | PRN

## 2023-04-01 MED ORDER — ACETAMINOPHEN 650 MG RE SUPP: 650.0000 mg | Freq: Four times a day (QID) | RECTAL | Status: DC | PRN

## 2023-04-01 MED ORDER — ENOXAPARIN SODIUM 60 MG/0.6ML IJ SOSY
60.0000 mg | PREFILLED_SYRINGE | INTRAMUSCULAR | Status: DC
  Administered 2023-04-01 – 2023-04-06 (×6): 60 mg via SUBCUTANEOUS
  Filled 2023-04-01 (×6): qty 0.6

## 2023-04-01 MED ORDER — IPRATROPIUM-ALBUTEROL 0.5-2.5 (3) MG/3ML IN SOLN
3.0000 mL | Freq: Four times a day (QID) | RESPIRATORY_TRACT | Status: DC
  Administered 2023-04-02 – 2023-04-06 (×18): 3 mL via RESPIRATORY_TRACT
  Filled 2023-04-01 (×18): qty 3

## 2023-04-01 MED ORDER — ACETAMINOPHEN 325 MG PO TABS: 650.0000 mg | ORAL_TABLET | Freq: Four times a day (QID) | ORAL | Status: DC | PRN

## 2023-04-01 MED ORDER — TRAZODONE HCL 50 MG PO TABS: 25.0000 mg | ORAL_TABLET | Freq: Every evening | ORAL | Status: DC | PRN

## 2023-04-01 MED ORDER — HYDRALAZINE HCL 20 MG/ML IJ SOLN
5.0000 mg | Freq: Four times a day (QID) | INTRAMUSCULAR | Status: DC | PRN
  Administered 2023-04-01 – 2023-04-02 (×2): 5 mg via INTRAVENOUS
  Filled 2023-04-01 (×2): qty 1

## 2023-04-01 NOTE — Plan of Care (Signed)
  Problem: Education: Goal: Knowledge of General Education information will improve Description: Including pain rating scale, medication(s)/side effects and non-pharmacologic comfort measures Outcome: Progressing   Problem: Health Behavior/Discharge Planning: Goal: Ability to manage health-related needs will improve Outcome: Progressing   Problem: Clinical Measurements: Goal: Ability to maintain clinical measurements within normal limits will improve Outcome: Progressing   Problem: Nutrition: Goal: Adequate nutrition will be maintained Outcome: Progressing   Problem: Coping: Goal: Level of anxiety will decrease Outcome: Progressing   Problem: Elimination: Goal: Will not experience complications related to bowel motility Outcome: Progressing   Problem: Pain Management: Goal: General experience of comfort will improve Outcome: Progressing   Problem: Safety: Goal: Ability to remain free from injury will improve Outcome: Progressing   Problem: Skin Integrity: Goal: Risk for impaired skin integrity will decrease Outcome: Progressing

## 2023-04-01 NOTE — H&P (Signed)
 History and Physical  Autumn Young FMW:[id number] DOB: Nov 05, 1945 DOA: 03/31/2023  PCP: Purcell Emil Schanz, MD   Chief Complaint: Cough, shortness of breath  HPI: Autumn Young is a 78 y.o. female with medical history significant for asthma, COPD on room air at baseline, hypertension, stroke, obesity being admitted to the hospital with cough, wheezing and suspected acute exacerbation of COPD.  Patient states that symptoms have been going on for about a week, consisting of cough, shortness of breath, and progressive dyspnea.  Denies any fevers, she has been taking her home nebulizer every couple of hours and it has helped a little bit.  She has been coughing up some frothy white sputum.  Also mentions that her granddaughter and great granddaughter have been sick, her granddaughter tested positive for influenza.  She was saturating 80% on arrival to the ER when on room air, she was given IV Solu-Medrol , breathing treatments, placed on 2 L nasal cannula oxygen .  Review of Systems: Please see HPI for pertinent positives and negatives. A complete 10 system review of systems are otherwise negative.  Past Medical History:  Diagnosis Date   Asthma    COPD (chronic obstructive pulmonary disease) (HCC)    GERD (gastroesophageal reflux disease)    Hypertension    Stroke [facility])    Past Surgical History:  Procedure Laterality Date   PACEMAKER IMPLANT N/A 09/23/2021   Procedure: PACEMAKER IMPLANT;  Surgeon: Cindie Ole DASEN, MD;  Location: [facility];  Service: Cardiovascular;  Laterality: N/A;   TUBAL LIGATION     Social History:  reports that she quit smoking about 40 years ago. Her smoking use included cigarettes. She started smoking about 42 years ago. She has a 6 pack-year smoking history. She has never used smokeless tobacco. She reports that she does not drink alcohol and does not use drugs.  Allergies  Allergen Reactions   Shellfish Allergy Anaphylaxis   Zocor [Simvastatin]  Swelling   Lasix [Furosemide] Swelling   Latex Rash   Penicillins Rash    Family History  Problem Relation Age of Onset   Cancer Mother        Lung, cervix, mouth   Hypertension Father    Cancer Sister        Colon   Diabetes Sister    Hypertension Sister    Heart disease Sister    Cancer Brother    Hypertension Brother      Prior to Admission medications   Medication Sig Start Date End Date Taking? Authorizing Provider  acetaminophen  (TYLENOL ) 500 MG tablet Take 500 mg by mouth every 6 (six) hours as needed (pain).    [provider]  ADVAIR  HFA 230-21 MCG/ACT inhaler Inhale 2 puffs into the lungs 2 (two) times daily. 02/12/23   Hope Almarie ORN, NP  albuterol  (PROVENTIL  HFA) 108 (90 Base) MCG/ACT inhaler Inhale 2 puffs into the lungs every 6 (six) hours as needed for wheezing or shortness of breath. 07/26/22   Gherghe, Nilda HERO, MD  albuterol  (PROVENTIL ) (2.5 MG/3ML) 0.083% nebulizer solution Take 3 mLs (2.5 mg total) by nebulization every 6 (six) hours as needed for wheezing or shortness of breath. 08/29/22   Desai, Nikita S, MD  ascorbic acid (VITAMIN C) 1000 MG tablet Take 1,000 mg by mouth daily. 05/14/18   [provider]  aspirin  EC 81 MG tablet Take 1 tablet (81 mg total) by mouth daily. Swallow whole. Patient not taking: Reported on 02/12/2023 07/26/22 07/26/23  Gherghe, Costin M,  MD  benzonatate  (TESSALON  PERLES) 100 MG capsule Take 1 capsule (100 mg total) by mouth 3 (three) times daily as needed for cough. 02/12/23   Hope Almarie ORN, NP  Carboxymethylcellulose Sodium (ARTIFICIAL TEARS OP) Place 1 drop into both eyes at bedtime as needed (dryness). Patient not taking: Reported on 08/29/2022    [provider]  Cholecalciferol 25 MCG (1000 UT) tablet Take 1,000 Units by mouth in the morning.    [provider]  clopidogrel  (PLAVIX ) 75 MG tablet Take 1 tablet (75 mg total) by mouth every morning. 03/04/23   Cindie Ole DASEN, MD   clotrimazole -betamethasone  (LOTRISONE ) cream Apply 1 Application topically 2 (two) times daily. 12/29/22   Purcell Emil Schanz, MD  Cyanocobalamin  (VITAMIN B-12 PO) Take 6 drops by mouth in the morning.    [provider]  guaiFENesin -dextromethorphan  (ROBITUSSIN DM) 100-10 MG/5ML syrup Take 5 mLs by mouth every 4 (four) hours as needed for cough. Patient not taking: Reported on 12/29/2022 07/26/22   Gherghe, Costin M, MD  montelukast  (SINGULAIR ) 10 MG tablet Take 10 mg by mouth at bedtime. 03/26/21   [provider]  olmesartan  (BENICAR ) 40 MG tablet Take 1 tablet (40 mg total) by mouth daily. 12/29/22   Purcell Emil Schanz, MD  Omega-3 Fatty Acids (OMEGA-3 PO) Take 1 capsule by mouth in the morning.    [provider]  pantoprazole  (PROTONIX ) 40 MG tablet TAKE 1 TABLET BY MOUTH DAILY BEFORE BREAKFAST 01/27/23   Purcell Emil Schanz, MD  propranolol  (INDERAL ) 20 MG tablet TAKE 1 TABLET BY MOUTH TWICE A DAY Patient not taking: Reported on 12/29/2022 11/03/22   Cindie Ole DASEN, MD  Red Yeast Rice Extract (RED YEAST RICE PO) Take 2 tablets by mouth daily.    [provider]  SPIRIVA  RESPIMAT 1.25 MCG/ACT AERS Inhale 1 puff into the lungs daily. 08/29/22   Meade Verdon RAMAN, MD  torsemide  (DEMADEX ) 20 MG tablet TAKE 1 TABLET BY MOUTH EVERY DAY 12/29/22   Purcell Emil Schanz, MD    Physical Exam: BP (!) 196/68 (BP Location: Left Arm)   Pulse 76   Temp (!) 97.4 F (36.3 C) (Oral)   Resp 18   Wt 120.2 kg   SpO2 98%   BMI 42.77 kg/m  General:  Alert, oriented, calm, in no acute distress, currently breathing comfortably, no cough, no audible wheezing.  Wearing 2 L nasal cannula oxygen . Cardiovascular: RRR, no murmurs or rubs, no peripheral edema  Respiratory: clear to auscultation bilaterally with distant breath sounds, minimal diffuse rhonchi, no wheezes, no crackles  Abdomen: soft, nontender, nondistended, normal bowel tones heard  Skin: dry, no rashes   Musculoskeletal: no joint effusions, normal range of motion  Psychiatric: appropriate affect, normal speech  Neurologic: extraocular muscles intact, clear speech, moving all extremities with intact sensorium         Labs on Admission:  Basic Metabolic Panel: Recent Labs  Lab 03/31/23 1949 03/31/23 2001  NA 136 140  K 3.2* 3.3*  CL 103  --   CO2 25  --   GLUCOSE 108*  --   BUN 11  --   CREATININE 0.94  --   CALCIUM 8.2*  --    Liver Function Tests: No results for input(s): AST, ALT, ALKPHOS, BILITOT, PROT, ALBUMIN in the last 168 hours. No results for input(s): LIPASE, AMYLASE in the last 168 hours. No results for input(s): AMMONIA in the last 168 hours. CBC: Recent Labs  Lab 03/31/23 1949 03/31/23 2001  WBC 5.0  --   HGB 9.9* 11.2*  HCT 32.4* 33.0*  MCV 84.2  --   PLT 234  --    Cardiac Enzymes: No results for input(s): CKTOTAL, CKMB, CKMBINDEX, TROPONINI in the last 168 hours. BNP (last 3 results) Recent Labs    05/13/22 2015 07/22/22 2032  BNP 54.8 68.0    ProBNP (last 3 results) Recent Labs    08/29/22 0911 02/12/23 1130  PROBNP 62.0 47.0    CBG: No results for input(s): GLUCAP in the last 168 hours.  Radiological Exams on Admission: DG Chest Portable 1 View Result Date: 03/31/2023 CLINICAL DATA:  Shortness of breath and fevers for 2 weeks, initial encounter EXAM: PORTABLE CHEST 1 VIEW COMPARISON:  02/11/2019 FINDINGS: Cardiac shadow is enlarged. Pacemaker is again identified and stable. The lungs are clear bilaterally. No bony abnormality is noted. IMPRESSION: No active disease. Electronically Signed   By: Oneil Devonshire M.D.   On: 03/31/2023 22:16   Assessment/Plan Autumn Young is a 78 y.o. female with medical history significant for asthma, COPD on room air at baseline, hypertension, stroke, obesity being admitted to the hospital with cough, wheezing and suspected acute exacerbation of COPD.   Acute exacerbation of  COPD-suspected due to cough, dyspnea, wheezing, no evidence of active cardiopulmonary disease on chest x-ray.  No fevers or leukocytosis, bacterial infection is not suspected. -Inpatient admission to MedSurg -Supplemental oxygen  as needed, wean as tolerated -Scheduled DuoNebs, as needed albuterol  -Incentive spirometry, and flutter valve -IV Solu-Medrol , with transition to oral prednisone  tomorrow  Acute hypoxic respiratory failure-likely due to acute exacerbation of COPD, treating as above -Wean oxygen  as able, with goal O2 saturation greater than 90%  History of CVA-continue home Plavix   GERD-Protonix   Hypertension-currently uncontrolled, continue home Avapro , and add as needed IV hydralazine   DVT prophylaxis: Lovenox      Code Status: Full Code  Consults called: None  Admission status: The appropriate patient status for this patient is INPATIENT. Inpatient status is judged to be reasonable and necessary in order to provide the required intensity of service to ensure the patient's safety. The patient's presenting symptoms, physical exam findings, and initial radiographic and laboratory data in the context of their chronic comorbidities is felt to place them at high risk for further clinical deterioration. Furthermore, it is not anticipated that the patient will be medically stable for discharge from the hospital within 2 midnights of admission.    I certify that at the point of admission it is my clinical judgment that the patient will require inpatient hospital care spanning beyond 2 midnights from the point of admission due to high intensity of service, high risk for further deterioration and high frequency of surveillance required   Time spent: 59 minutes  Autumn Young CHRISTELLA Gail MD [facility] Pager [phone]  If 7PM-7AM, please contact night-coverage [email or web address] Password [email or web address]  04/01/2023, 2:40 PM

## 2023-04-01 NOTE — ED Notes (Signed)
 Assisted patent to [facility] without incident. Pt very SOB afterwards, consistent with exertional dyspnea. No new pain. Pt to receive DuoNeb from [facility].

## 2023-04-01 NOTE — Plan of Care (Signed)

## 2023-04-01 NOTE — ED Notes (Signed)
 Pt is asleep

## 2023-04-01 NOTE — ED Notes (Signed)
 Assisted up to [facility], tol well. Given po fluids

## 2023-04-01 NOTE — Progress Notes (Signed)
   04/01/23 2036  BiPAP/CPAP/SIPAP  BiPAP/CPAP/SIPAP Pt Type Adult  Reason BIPAP/CPAP not in use Non-compliant (Patient refused CPAP qhs.  Patient states she is unable to tolerate it and prefers sleeping with the nasal cannula in.)  BiPAP/CPAP /SiPAP Vitals  Pulse Rate 66  Resp (!) 21  SpO2 98 %  Bilateral Breath Sounds Diminished;Expiratory wheezes  MEWS Score/Color  MEWS Score 1  MEWS Score Color Green

## 2023-04-01 NOTE — ED Notes (Signed)
 ED TO INPATIENT HANDOFF REPORT  ED Nurse Name and Phone #: Rexene, RN  S Name/Age/Gender Autumn Young 78 y.o. female Room/Bed: MH04/MH04  Code Status   Code Status: Prior  Home/SNF/Other Home Patient oriented to: self, place, time, and situation Is this baseline? Yes   Triage Complete: Triage complete  Chief Complaint Acute exacerbation of chronic obstructive pulmonary disease (COPD) (HCC) [J44.1]  Triage Note Pt arrives with c/o SOB that started about 2 weeks ago, but worsened today. Pt has hx of COPD. Pt denies CP.    Allergies Allergies  Allergen Reactions   Shellfish Allergy Anaphylaxis   Zocor [Simvastatin] Swelling   Lasix [Furosemide] Swelling   Latex Rash   Penicillins Rash    Level of Care/Admitting Diagnosis ED Disposition     ED Disposition  Admit   Condition  --   Comment  [facility] [[id number]]  Level of Care: Med-Surg [16]  May admit patient to [facility] or [facility] if equivalent level of care is available:: Yes  Interfacility transfer: Yes  Covid Evaluation: Asymptomatic - no recent exposure (last 10 days) testing not required  Diagnosis: Acute exacerbation of chronic obstructive pulmonary disease (COPD) (HCC) [712976]  Admitting Physician: LAVEDA ROOSEVELT [[id number]]  Attending Physician: LAVEDA ROOSEVELT [[id number]]  Certification:: I certify this patient will need inpatient services for at least 2 midnights  Expected Medical Readiness: 04/02/2023          B Medical/Surgery History Past Medical History:  Diagnosis Date   Asthma    COPD (chronic obstructive pulmonary disease) (HCC)    GERD (gastroesophageal reflux disease)    Hypertension    Stroke [facility])    Past Surgical History:  Procedure Laterality Date   PACEMAKER IMPLANT N/A 09/23/2021   Procedure: PACEMAKER IMPLANT;  Surgeon: Cindie Ole DASEN, MD;  Location: [facility] CV LAB;  Service: Cardiovascular;  Laterality: N/A;   TUBAL LIGATION        A IV Location/Drains/Wounds Patient Lines/Drains/Airways Status     Active Line/Drains/Airways     Name Placement date Placement time Site Days   Peripheral IV 03/31/23 20 G 1 Anterior;Proximal;Right Forearm 03/31/23  1952  Forearm  1            Intake/Output Last 24 hours  Intake/Output Summary (Last 24 hours) at 04/01/2023 0742 Last data filed at 03/31/2023 2143 Gross per 24 hour  Intake 50 ml  Output --  Net 50 ml    Labs/Imaging Results for orders placed or performed during the hospital encounter of 03/31/23 (from the past 48 hours)  Basic metabolic panel     Status: Abnormal   Collection Time: 03/31/23  7:49 PM  Result Value Ref Range   Sodium 136 135 - 145 mmol/L   Potassium 3.2 (L) 3.5 - 5.1 mmol/L   Chloride 103 98 - 111 mmol/L   CO2 25 22 - 32 mmol/L   Glucose, Bld 108 (H) 70 - 99 mg/dL    Comment: Glucose reference range applies only to samples taken after fasting for at least 8 hours.   BUN 11 8 - 23 mg/dL   Creatinine, Ser 9.05 0.44 - 1.00 mg/dL   Calcium 8.2 (L) 8.9 - 10.3 mg/dL   GFR, Estimated >39 >39 mL/min    Comment: (NOTE) Calculated using the CKD-EPI Creatinine Equation (2021)    Anion gap 8 5 - 15    Comment: Performed at [facility], [address] [place], [place]  [address]  CBC     Status: Abnormal   Collection Time: 03/31/23  7:49 PM  Result Value Ref Range   WBC 5.0 4.0 - 10.5 K/uL   RBC 3.85 (L) 3.87 - 5.11 MIL/uL   Hemoglobin 9.9 (L) 12.0 - 15.0 g/dL   HCT 67.5 (L) 63.9 - 53.9 %   MCV 84.2 80.0 - 100.0 fL   MCH 25.7 (L) 26.0 - 34.0 pg   MCHC 30.6 30.0 - 36.0 g/dL   RDW 85.8 88.4 - 84.4 %   Platelets 234 150 - 400 K/uL   nRBC 0.0 0.0 - 0.2 %    Comment: Performed at [facility], [address] [place], [place] [address]  I-Stat venous blood gas, [facility], [facility], [facility])     Status: Abnormal   Collection Time: 03/31/23  8:01 PM  Result Value Ref Range   pH, Ven 7.385 7.25 - 7.43   pCO2, Ven 49.1 44 - 60  mmHg   pO2, Ven 46 (H) 32 - 45 mmHg   Bicarbonate 29.2 (H) 20.0 - 28.0 mmol/L   TCO2 31 22 - 32 mmol/L   O2 Saturation 79 %   Acid-Base Excess 4.0 (H) 0.0 - 2.0 mmol/L   Sodium 140 135 - 145 mmol/L   Potassium 3.3 (L) 3.5 - 5.1 mmol/L   Calcium, Ion 1.09 (L) 1.15 - 1.40 mmol/L   HCT 33.0 (L) 36.0 - 46.0 %   Hemoglobin 11.2 (L) 12.0 - 15.0 g/dL   Patient temperature 899.9 F    Sample type VENOUS   Resp panel by RT-PCR (RSV, Flu A&B, Covid) Anterior Nasal Swab     Status: None   Collection Time: 03/31/23  8:31 PM   Specimen: Anterior Nasal Swab  Result Value Ref Range   SARS Coronavirus 2 by RT PCR NEGATIVE NEGATIVE    Comment: (NOTE) SARS-CoV-2 target nucleic acids are NOT DETECTED.  The SARS-CoV-2 RNA is generally detectable in upper respiratory specimens during the acute phase of infection. The lowest concentration of SARS-CoV-2 viral copies this assay can detect is 138 copies/mL. A negative result does not preclude SARS-Cov-2 infection and should not be used as the sole basis for treatment or other patient management decisions. A negative result may occur with  improper specimen collection/handling, submission of specimen other than nasopharyngeal swab, presence of viral mutation(s) within the areas targeted by this assay, and inadequate number of viral copies(<138 copies/mL). A negative result must be combined with clinical observations, patient history, and epidemiological information. The expected result is Negative.  Fact Sheet for Patients:  bloggercourse.com  Fact Sheet for Healthcare Providers:  seriousbroker.it  This test is no t yet approved or cleared by the [place]  FDA and  has been authorized for detection and/or diagnosis of SARS-CoV-2 by FDA under an Emergency Use Authorization (EUA). This EUA will remain  in effect (meaning this test can be used) for the duration of the COVID-19 declaration under  Section 564(b)(1) of the Act, 21 U.S.C.section 360bbb-3(b)(1), unless the authorization is terminated  or revoked sooner.       Influenza A by PCR NEGATIVE NEGATIVE   Influenza B by PCR NEGATIVE NEGATIVE    Comment: (NOTE) The Xpert Xpress SARS-CoV-2/FLU/RSV plus assay is intended as an aid in the diagnosis of influenza from Nasopharyngeal swab specimens and should not be used as a sole basis for treatment. Nasal washings and aspirates are unacceptable for Xpert Xpress SARS-CoV-2/FLU/RSV testing.  Fact Sheet for Patients: bloggercourse.com  Fact Sheet for Healthcare Providers: seriousbroker.it  This test is not yet approved or cleared by the [place]  FDA and has been authorized for detection and/or diagnosis of SARS-CoV-2 by FDA under an Emergency Use Authorization (EUA). This EUA will remain in effect (meaning this test can be used) for the duration of the COVID-19 declaration under Section 564(b)(1) of the Act, 21 U.S.C. section 360bbb-3(b)(1), unless the authorization is terminated or revoked.     Resp Syncytial Virus by PCR NEGATIVE NEGATIVE    Comment: (NOTE) Fact Sheet for Patients: bloggercourse.com  Fact Sheet for Healthcare Providers: seriousbroker.it  This test is not yet approved or cleared by the [place]  FDA and has been authorized for detection and/or diagnosis of SARS-CoV-2 by FDA under an Emergency Use Authorization (EUA). This EUA will remain in effect (meaning this test can be used) for the duration of the COVID-19 declaration under Section 564(b)(1) of the Act, 21 U.S.C. section 360bbb-3(b)(1), unless the authorization is terminated or revoked.  Performed at [facility], [address] [place], [place] [address]    DG Chest Portable 1 View Result Date: 03/31/2023 CLINICAL DATA:  Shortness of breath and fevers for 2 weeks,  initial encounter EXAM: PORTABLE CHEST 1 VIEW COMPARISON:  02/11/2019 FINDINGS: Cardiac shadow is enlarged. Pacemaker is again identified and stable. The lungs are clear bilaterally. No bony abnormality is noted. IMPRESSION: No active disease. Electronically Signed   By: Oneil Devonshire M.D.   On: 03/31/2023 22:16    Pending Labs Unresulted Labs (From admission, onward)    None       Vitals/Pain Today's Vitals   04/01/23 0330 04/01/23 0600 04/01/23 0606 04/01/23 0705  BP: (!) 185/97 (!) 166/77    Pulse: 83 68    Resp: 14 18    Temp:    98.2 F (36.8 C)  SpO2: 95% 96% 97%   Weight:      PainSc:        Isolation Precautions No active isolations  Medications Medications  ipratropium-albuterol  (DUONEB) 0.5-2.5 (3) MG/3ML nebulizer solution 3 mL (3 mLs Nebulization Given 04/01/23 0737)  clopidogrel  (PLAVIX ) tablet 75 mg (has no administration in time range)  irbesartan  (AVAPRO ) tablet 300 mg (300 mg Oral Given 03/31/23 2308)  pantoprazole  (PROTONIX ) EC tablet 40 mg (40 mg Oral Given 04/01/23 0738)  albuterol  (PROVENTIL ) (2.5 MG/3ML) 0.083% nebulizer solution 2.5 mg (has no administration in time range)  methylPREDNISolone  sodium succinate (SOLU-MEDROL ) 40 mg/mL injection 40 mg (40 mg Intravenous Given 04/01/23 0738)    Followed by  predniSONE  (DELTASONE ) tablet 40 mg (has no administration in time range)  mometasone -formoterol  (DULERA ) 200-5 MCG/ACT inhaler 2 puff (has no administration in time range)  ipratropium (ATROVENT ) nebulizer solution 0.5 mg (has no administration in time range)  arformoterol  (BROVANA ) nebulizer solution 15 mcg (has no administration in time range)  ipratropium-albuterol  (DUONEB) 0.5-2.5 (3) MG/3ML nebulizer solution 3 mL (3 mLs Nebulization Given 03/31/23 1927)  albuterol  (PROVENTIL ) (2.5 MG/3ML) 0.083% nebulizer solution 2.5 mg (2.5 mg Nebulization Given 03/31/23 1927)  albuterol  (PROVENTIL ,VENTOLIN ) solution continuous neb (15 mg/hr Nebulization Given 03/31/23  1933)  ipratropium (ATROVENT ) nebulizer solution 1 mg (1 mg Nebulization Given 03/31/23 1933)  magnesium  sulfate IVPB 2 g 50 mL (0 g Intravenous Stopped 03/31/23 2143)  methylPREDNISolone  sodium succinate (SOLU-MEDROL ) 125 mg/2 mL injection 125 mg (125 mg Intravenous Given 03/31/23 2020)  potassium chloride  SA (KLOR-CON  M) CR tablet 40 mEq (40 mEq Oral Given 04/01/23 0029)    Mobility walks  with device     Focused Assessments Pulmonary Assessment Handoff:  Lung sounds: Bilateral Breath Sounds: Expiratory wheezes L Breath Sounds: Expiratory wheezes R Breath Sounds: Expiratory wheezes O2 Device: Nasal Cannula O2 Flow Rate (L/min): 2 L/min    R Recommendations: See Admitting Provider Note  Report given to:   Additional Notes:

## 2023-04-01 NOTE — ED Notes (Signed)
   04/01/23 1205  Respiratory Assessment  Assessment Type Pre-treatment  Respiratory Pattern Regular;Labored;Accessory muscle use;Dyspnea at rest  Chest Assessment Chest expansion symmetrical  Bilateral Breath Sounds Diminished;Expiratory wheezes  Oxygen  Therapy/Pulse Ox  O2 Device Nasal Cannula  O2 Therapy Oxygen   O2 Flow Rate (L/min) 2 L/min   Up to bedside commode, increased WOB, increased wheezes t/o, remains on 2lpm O2 with SpO2 96%.

## 2023-04-02 ENCOUNTER — Inpatient Hospital Stay (HOSPITAL_COMMUNITY): Payer: Medicare HMO

## 2023-04-02 DIAGNOSIS — J441 Chronic obstructive pulmonary disease with (acute) exacerbation: Secondary | ICD-10-CM | POA: Diagnosis not present

## 2023-04-02 LAB — RESPIRATORY PANEL BY PCR

## 2023-04-02 LAB — CBC
HCT: 34.1 % — ABNORMAL LOW (ref 36.0–46.0)
Hemoglobin: 10.2 g/dL — ABNORMAL LOW (ref 12.0–15.0)
MCH: 26.1 pg (ref 26.0–34.0)
MCHC: 29.9 g/dL — ABNORMAL LOW (ref 30.0–36.0)
MCV: 87.2 fL (ref 80.0–100.0)
Platelets: 262 10*3/uL (ref 150–400)
RBC: 3.91 MIL/uL (ref 3.87–5.11)
RDW: 14.2 % (ref 11.5–15.5)
WBC: 10.3 10*3/uL (ref 4.0–10.5)
nRBC: 0 % (ref 0.0–0.2)

## 2023-04-02 LAB — TROPONIN I (HIGH SENSITIVITY)
Troponin I (High Sensitivity): 10 ng/L (ref <18)
Troponin I (High Sensitivity): 12 ng/L (ref <18)

## 2023-04-02 LAB — BASIC METABOLIC PANEL
Anion gap: 8 (ref 5–15)
BUN: 17 mg/dL (ref 8–23)
CO2: 22 mmol/L (ref 22–32)
Calcium: 8.5 mg/dL — ABNORMAL LOW (ref 8.9–10.3)
Chloride: 105 mmol/L (ref 98–111)
Creatinine, Ser: 0.82 mg/dL (ref 0.44–1.00)
GFR, Estimated: >60 mL/min (ref >60)
Glucose, Bld: 156 mg/dL — ABNORMAL HIGH (ref 70–99)
Potassium: 4.1 mmol/L (ref 3.5–5.1)
Sodium: 135 mmol/L (ref 135–145)

## 2023-04-02 MED ORDER — GUAIFENESIN ER 600 MG PO TB12
600.0000 mg | ORAL_TABLET | Freq: Two times a day (BID) | ORAL | Status: DC
  Administered 2023-04-02 – 2023-04-07 (×11): 600 mg via ORAL
  Filled 2023-04-02 (×11): qty 1

## 2023-04-02 MED ORDER — ORAL CARE MOUTH RINSE: 15.0000 mL | OROMUCOSAL | Status: DC | PRN

## 2023-04-02 MED ORDER — AMLODIPINE BESYLATE 10 MG PO TABS
10.0000 mg | ORAL_TABLET | Freq: Every day | ORAL | Status: DC
  Administered 2023-04-02 – 2023-04-07 (×6): 10 mg via ORAL
  Filled 2023-04-02 (×6): qty 1

## 2023-04-02 MED ORDER — HYDRALAZINE HCL 25 MG PO TABS
25.0000 mg | ORAL_TABLET | Freq: Four times a day (QID) | ORAL | Status: DC | PRN
Start: 2023-04-02 — End: 2023-04-07
  Administered 2023-04-02: 25 mg via ORAL
  Filled 2023-04-02: qty 1

## 2023-04-02 MED ORDER — MONTELUKAST SODIUM 10 MG PO TABS
10.0000 mg | ORAL_TABLET | Freq: Every day | ORAL | Status: DC
  Administered 2023-04-02 – 2023-04-06 (×5): 10 mg via ORAL
  Filled 2023-04-02 (×5): qty 1

## 2023-04-02 NOTE — Evaluation (Signed)
 Occupational Therapy Evaluation Patient Details Name: Autumn Young MRN: [id number] DOB: [birth year] Today's Date: 04/02/2023   History of Present Illness 78 year old F with PMH of asthma/COPD, CVA, HTN, morbid obesity and GERD presenting with productive cough, wheezing and shortness of breath for about a week, and admitted with working diagnosis of acute hypoxic respiratory failure with hypoxia due to COPD exacerbation   Clinical Impression   Pt admitted with the above diagnosis. Pt currently with functional limitations due to the deficits listed below (see OT Problem List). Prior to admit, pt reports that she has been living at home with her granddaughter and great granddaughter independent with ADL tasks and functional mobility. Pt reports increased difficulty completing required tasks due to SOB and difficulty with energy conservation which has been getting progressively worse. Pt will benefit from acute skilled OT to increase their safety and independence with ADL and functional mobility for ADL to facilitate discharge. Complete one more OT treatment to educate pt on energy conservation techniques prior to discharge. Recommend shower chair (without back) per pt's request. Pt reports that she has a narrow base garden type tub which she is not willing to replace at this time. Concerns for if shower chair will fit inside. OT will continue to follow patient acutely.         If plan is discharge home, recommend the following: Assistance with cooking/housework    Functional Status Assessment  Patient has had a recent decline in their functional status and demonstrates the ability to make significant improvements in function in a reasonable and predictable amount of time.  Equipment Recommendations  Tub/shower seat (shower seat without back per pt's request)       Precautions / Restrictions   None     Mobility Bed Mobility Overal bed mobility: Independent     Transfers Overall transfer  level: Independent Equipment used: None     Balance Overall balance assessment: Independent            ADL either performed or assessed with clinical judgement   ADL Overall ADL's : Independent;Modified independent;At baseline          Vision Baseline Vision/History: 0 No visual deficits Ability to See in Adequate Light: 0 Adequate Patient Visual Report: No change from baseline Vision Assessment?: No apparent visual deficits     Perception Perception: Not tested       Praxis Praxis: Not tested       Pertinent Vitals/Pain Pain Assessment Pain Assessment: No/denies pain     Extremity/Trunk Assessment Upper Extremity Assessment Upper Extremity Assessment: Right hand dominant;Overall WFL for tasks assessed   Lower Extremity Assessment Lower Extremity Assessment: Overall WFL for tasks assessed   Cervical / Trunk Assessment Cervical / Trunk Assessment: Other exceptions Cervical / Trunk Exceptions: body habitus   Communication Communication Communication: No apparent difficulties   Cognition Arousal: Alert Behavior During Therapy: WFL for tasks assessed/performed Overall Cognitive Status: Within Functional Limits for tasks assessed        General Comments  Pt requested to walk within room. No device used. SpO2 on 2L [place] 97%, 96% on RA at rest sitting, 91% while ambulating on RA. Pt presented with audible wheezing and decreased activity tolerance. Required an extended sitting rest break to recover breathing. 2L O2 replaced at end of session.            Home Living Family/patient expects to be discharged to:: Private residence Living Arrangements: Other relatives (granddaughter, great grandaughter 6 y.o.) Available Help at Discharge:  Family;Available PRN/intermittently Type of Home: House Home Access: Stairs to enter [employer] of Steps: 3 Entrance Stairs-Rails: None Home Layout: One level     Bathroom Shower/Tub: Tub/shower unit          Home Equipment: Cane - single point          Prior Functioning/Environment Prior Level of Function : Independent/Modified Independent;Driving    Mobility Comments: uses cane intermittently ADLs Comments: independent        OT Problem List: Decreased activity tolerance;Cardiopulmonary status limiting activity      OT Treatment/Interventions: Therapeutic activities;Self-care/ADL training;Patient/family education;DME and/or AE instruction;Energy conservation    OT Goals(Current goals can be found in the care plan section) Acute Rehab OT Goals Patient Stated Goal: to sit up in recliner OT Goal Formulation: With patient Time For Goal Achievement: 04/06/23 Potential to Achieve Goals: Good  OT Frequency: Min 1X/week       AM-PAC OT 6 Clicks Daily Activity     Outcome Measure Help from another person eating meals?: None Help from another person taking care of personal grooming?: None Help from another person toileting, which includes using toliet, bedpan, or urinal?: None Help from another person bathing (including washing, rinsing, drying)?: None Help from another person to put on and taking off regular upper body clothing?: None Help from another person to put on and taking off regular lower body clothing?: None 6 Click Score: 24   End of Session Equipment Utilized During Treatment: Oxygen   Activity Tolerance: Patient tolerated treatment well Patient left: in chair;with call bell/phone within reach  OT Visit Diagnosis: Other (comment) (decreased activity tolerance)                Time: 1342-1420 OT Time Calculation (min): 38 min Charges:  OT General Charges $OT Visit: 1 Visit OT Evaluation $OT Eval Moderate Complexity: 1 Mod OT Treatments $Therapeutic Activity: 8-22 mins  [employer], OTR/L,CBIS  Supplemental OT - [facility] Secure Chat Preferred    Vivan Vanderveer, Leita BIRCH 04/02/2023, 2:27 PM

## 2023-04-02 NOTE — Progress Notes (Signed)
 SATURATION QUALIFICATIONS: (This note is used to comply with regulatory documentation for home oxygen )  Patient Saturations on Room Air at Rest = 96%  Patient Saturations on Room Air while Ambulating = 91%  Patient Saturations on 2 Liters of oxygen  while Ambulating = 98%  Please briefly explain why patient needs home oxygen : Pt does not appear to qualify for home oxygen  with the above oxygen  saturation numbers. Pt does exhibit SOB and wheezing during activity/exertion requiring increased time to recover before continuing with needed tasks.   Leita Howell, OTR/L,CBIS  Supplemental OT - [facility] Secure Chat Preferred

## 2023-04-02 NOTE — Progress Notes (Signed)
 PROGRESS NOTE  Autumn Young FMW:[id number] DOB: [birth year]   PCP: Purcell Emil Schanz, MD  Patient is from: Home.  Uses cane and rolling walker at baseline.  Granddaughter lives with her.  DOA: 03/31/2023 LOS: 1  Chief complaints Chief Complaint  Patient presents with   Shortness of Breath     Brief Narrative / Interim history: 78 year old F with PMH of asthma/COPD, CVA, HTN, morbid obesity and GERD presenting with productive cough, wheezing and shortness of breath for about a week, and admitted with working diagnosis of acute hypoxic respiratory failure with hypoxia due to COPD exacerbation.  Grandchildren with influenza.   In ED, Slightly hypertensive.  No desaturation but started on 2 L.  Labs significant for hypokalemia and anemia.  RVP negative.  Portable CXR without significant finding.  Started on steroid, scheduled and as needed nebulizers, and admitted.  Subjective: Seen and examined earlier this morning.  She reports improvement in her breathing.  However, she had an episode of substernal not chest pain radiating to her left chest when she was sitting on toilet.  Not able to describe the pain.  Pain was severe and she was anxious to move.  Pain lasted about 10 minutes and resolved.  No radiation to his shoulder or his arms.  No associated dyspnea, diaphoresis, nausea, palpitation or dizziness.  Currently chest pain-free.  Objective: Vitals:   04/02/23 0653 04/02/23 0811 04/02/23 0857 04/02/23 0947  BP: (!) 172/70   (!) 197/83  Pulse: 69   73  Resp:    16  Temp:    97.6 F (36.4 C)  TempSrc:    Oral  SpO2:  96% 92% 95%  Weight:      Height:        Examination:  GENERAL: No apparent distress.  Nontoxic. HEENT: MMM.  Vision and hearing grossly intact.  NECK: Supple.  No apparent JVD.  RESP:  No IWOB.  Diminished aeration but limited exam due to body habitus.  Some rhonchi. CVS:  RRR. Heart sounds normal.  ABD/GI/GU: BS+. Abd soft, NTND.  MSK/EXT:  Moves  extremities. No apparent deformity.  Trace BLE edema. SKIN: no apparent skin lesion or wound NEURO: Awake, alert and oriented appropriately.  No apparent focal neuro deficit. PSYCH: Calm. Normal affect.   Procedures:  None  Microbiology summarized: COVID-19, influenza and RSV PCR nonreactive  Assessment and plan: Acute exacerbation of COPD-suspected due to cough, dyspnea, wheezing.  No fever or leukocytosis.  CXR unrevealing.  Positive sick contact with one of the grandchild with influenza but COVID-19, influenza and RSV nonreactive.  Overall, she is improving. -Continue systemic steroid, scheduled and as needed nebulizers -Mucolytics and PPI -Wean oxygen  as able -Check 20 pathogen RVP -Incentive spirometry, OOB, PT/OT   Atypical chest pain: Unclear etiology of this.  EKG and serial troponin reassuring.  Currently chest pain-free.  Initial portable CXR unrevealing. -Check two-view chest x-ray  Uncontrolled hypertension: BP elevated. -Continue Avapro  for home Benicar  -Add amlodipine  -P.o. hydralazine  as needed  History of CVA -continue home Plavix    GERD -Continue Protonix   Physical deconditioning-reports using cane and walker at times. -PT/OT  Morbid obesity Body mass index is 46.9 kg/m.           DVT prophylaxis:  SCDs Start: 04/01/23 1426 On subcu Lovenox  Code Status: Full code Family Communication: None at bedside Level of care: Med-Surg Status is: Inpatient Remains inpatient appropriate because: Acute COPD exacerbation   Final disposition: Home Consultants:  None  55 minutes with  more than 50% spent in reviewing records, counseling patient/family and coordinating care.   Sch Meds:  Scheduled Meds:  amLODipine   10 mg Oral Daily   clopidogrel   75 mg Oral q morning   enoxaparin  (LOVENOX ) injection  60 mg Subcutaneous Q24H   ipratropium-albuterol   3 mL Nebulization Q6H   irbesartan   300 mg Oral Daily   pantoprazole   40 mg Oral QAC breakfast    predniSONE   40 mg Oral Q breakfast   Continuous Infusions: PRN Meds:.acetaminophen  **OR** acetaminophen , albuterol , hydrALAZINE , ondansetron  **OR** ondansetron  (ZOFRAN ) IV, traZODone   Antimicrobials: Anti-infectives (From admission, onward)    None        I have personally reviewed the following labs and images: CBC: Recent Labs  Lab 03/31/23 1949 03/31/23 2001 04/02/23 0404  WBC 5.0  --  10.3  HGB 9.9* 11.2* 10.2*  HCT 32.4* 33.0* 34.1*  MCV 84.2  --  87.2  PLT 234  --  262   BMP &GFR Recent Labs  Lab 03/31/23 1949 03/31/23 2001 04/02/23 0404  NA 136 140 135  K 3.2* 3.3* 4.1  CL 103  --  105  CO2 25  --  22  GLUCOSE 108*  --  156*  BUN 11  --  17  CREATININE 0.94  --  0.82  CALCIUM 8.2*  --  8.5*   Estimated Creatinine Clearance: 77.5 mL/min (by C-G formula based on SCr of 0.82 mg/dL). Liver & Pancreas: No results for input(s): AST, ALT, ALKPHOS, BILITOT, PROT, ALBUMIN in the last 168 hours. No results for input(s): LIPASE, AMYLASE in the last 168 hours. No results for input(s): AMMONIA in the last 168 hours. Diabetic: No results for input(s): HGBA1C in the last 72 hours. No results for input(s): GLUCAP in the last 168 hours. Cardiac Enzymes: No results for input(s): CKTOTAL, CKMB, CKMBINDEX, TROPONINI in the last 168 hours. Recent Labs    08/29/22 0911 02/12/23 1130  PROBNP 62.0 47.0   Coagulation Profile: No results for input(s): INR, PROTIME in the last 168 hours. Thyroid  Function Tests: No results for input(s): TSH, T4TOTAL, FREET4, T3FREE, THYROIDAB in the last 72 hours. Lipid Profile: No results for input(s): CHOL, HDL, LDLCALC, TRIG, CHOLHDL, LDLDIRECT in the last 72 hours. Anemia Panel: No results for input(s): VITAMINB12, FOLATE, FERRITIN, TIBC, IRON , RETICCTPCT in the last 72 hours. Urine analysis: No results found for: COLORURINE, APPEARANCEUR, LABSPEC, PHURINE,  GLUCOSEU, HGBUR, BILIRUBINUR, KETONESUR, PROTEINUR, UROBILINOGEN, NITRITE, LEUKOCYTESUR Sepsis Labs: Invalid input(s): PROCALCITONIN, LACTICIDVEN  Microbiology: Recent Results (from the past 240 hours)  Resp panel by RT-PCR (RSV, Flu A&B, Covid) Anterior Nasal Swab     Status: None   Collection Time: 03/31/23  8:31 PM   Specimen: Anterior Nasal Swab  Result Value Ref Range Status   SARS Coronavirus 2 by RT PCR NEGATIVE NEGATIVE Final    Comment: (NOTE) SARS-CoV-2 target nucleic acids are NOT DETECTED.  The SARS-CoV-2 RNA is generally detectable in upper respiratory specimens during the acute phase of infection. The lowest concentration of SARS-CoV-2 viral copies this assay can detect is 138 copies/mL. A negative result does not preclude SARS-Cov-2 infection and should not be used as the sole basis for treatment or other patient management decisions. A negative result may occur with  improper specimen collection/handling, submission of specimen other than nasopharyngeal swab, presence of viral mutation(s) within the areas targeted by this assay, and inadequate number of viral copies(<138 copies/mL). A negative result must be combined with clinical observations, patient history, and epidemiological information. The  expected result is Negative.  Fact Sheet for Patients:  bloggercourse.com  Fact Sheet for Healthcare Providers:  seriousbroker.it  This test is no t yet approved or cleared by the [place]  FDA and  has been authorized for detection and/or diagnosis of SARS-CoV-2 by FDA under an Emergency Use Authorization (EUA). This EUA will remain  in effect (meaning this test can be used) for the duration of the COVID-19 declaration under Section 564(b)(1) of the Act, 21 U.S.C.section 360bbb-3(b)(1), unless the authorization is terminated  or revoked sooner.       Influenza A by PCR NEGATIVE NEGATIVE Final    Influenza B by PCR NEGATIVE NEGATIVE Final    Comment: (NOTE) The Xpert Xpress SARS-CoV-2/FLU/RSV plus assay is intended as an aid in the diagnosis of influenza from Nasopharyngeal swab specimens and should not be used as a sole basis for treatment. Nasal washings and aspirates are unacceptable for Xpert Xpress SARS-CoV-2/FLU/RSV testing.  Fact Sheet for Patients: bloggercourse.com  Fact Sheet for Healthcare Providers: seriousbroker.it  This test is not yet approved or cleared by the [place]  FDA and has been authorized for detection and/or diagnosis of SARS-CoV-2 by FDA under an Emergency Use Authorization (EUA). This EUA will remain in effect (meaning this test can be used) for the duration of the COVID-19 declaration under Section 564(b)(1) of the Act, 21 U.S.C. section 360bbb-3(b)(1), unless the authorization is terminated or revoked.     Resp Syncytial Virus by PCR NEGATIVE NEGATIVE Final    Comment: (NOTE) Fact Sheet for Patients: bloggercourse.com  Fact Sheet for Healthcare Providers: seriousbroker.it  This test is not yet approved or cleared by the [place]  FDA and has been authorized for detection and/or diagnosis of SARS-CoV-2 by FDA under an Emergency Use Authorization (EUA). This EUA will remain in effect (meaning this test can be used) for the duration of the COVID-19 declaration under Section 564(b)(1) of the Act, 21 U.S.C. section 360bbb-3(b)(1), unless the authorization is terminated or revoked.  Performed at [facility], [address] [place], [place] [address]     Radiology Studies: No results found.    Brynn Reznik T. Jair Lindblad [facility]  If 7PM-7AM, please contact night-coverage [email or web address] 04/02/2023, 12:19 PM

## 2023-04-02 NOTE — Progress Notes (Signed)
   04/02/23 2310  BiPAP/CPAP/SIPAP  Reason BIPAP/CPAP not in use Non-compliant

## 2023-04-02 NOTE — TOC Initial Note (Signed)
 Transition of Care [facility]) - Initial/Assessment Note   Patient Details  Name: Autumn Young MRN: [id number] Date of Birth: 1945-10-28  [facility] CM/SW Contact:    Duwaine GORMAN Aran, LCSW Phone Number: 04/02/2023, 11:35 AM  Clinical Narrative: Patient has been intermittently on 2L/min oxygen  since her admission. Patient confirmed she is not on home oxygen  at baseline. TOC following for possible home oxygen .  Expected Discharge Plan: Home/Self Care Barriers to Discharge: Continued Medical Work up  Patient Goals and CMS Choice Patient states their goals for this hospitalization and ongoing recovery are:: Return home  Expected Discharge Plan and Services In-house Referral: Clinical Social Work  Prior Living Arrangements/Services Patient language and need for interpreter reviewed:: Yes Do you feel safe going back to the place where you live?: Yes      Need for Family Participation in Patient Care: No (Comment) Care giver support system in place?: Yes (comment) Criminal Activity/Legal Involvement Pertinent to Current Situation/Hospitalization: No - Comment as needed  Activities of Daily Living ADL Screening (condition at time of admission) Independently performs ADLs?: Yes (appropriate for developmental age) Is the patient deaf or have difficulty hearing?: No Does the patient have difficulty seeing, even when wearing glasses/contacts?: No Does the patient have difficulty concentrating, remembering, or making decisions?: No  Emotional Assessment Attitude/Demeanor/Rapport: Gracious Affect (typically observed): Accepting Orientation: : Oriented to Self, Oriented to Place, Oriented to  Time, Oriented to Situation Alcohol / Substance Use: Not Applicable Psych Involvement: No (comment)  Admission diagnosis:  Acute exacerbation of chronic obstructive pulmonary disease (COPD) (HCC) [J44.1] Hypoxia [R09.02] COPD exacerbation (HCC) [J44.1] Patient Active Problem List   Diagnosis  Date Noted   Acute exacerbation of chronic obstructive pulmonary disease (COPD) (HCC) 03/31/2023   Esophageal dysphagia 02/11/2023   Yeast dermatitis 12/29/2022   Peripheral edema 09/25/2022   Chronic heart failure with preserved ejection fraction (HCC) 09/25/2022   Morbid obesity (HCC) 09/25/2022   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   Dyslipidemia 08/06/2021   History of CVA (cerebrovascular accident) 06/07/2021   OSA (obstructive sleep apnea) 11/28/2015   COPD (chronic obstructive pulmonary disease) with chronic bronchitis (HCC) 06/26/2015   Pulmonary hypertension (HCC) 06/26/2015   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   Mild intermittent asthma without complication 11/19/2014   Essential hypertension 11/18/2014   PCP:  Purcell Emil Schanz, MD Pharmacy:   [id number] - [place], [place] - [address] [place] [address] Phone: [phone] Fax: [phone]  Social Drivers of Health (SDOH) Social History: SDOH Screenings   Food Insecurity: No Food Insecurity (04/01/2023)  Housing: Low Risk  (04/01/2023)  Transportation Needs: No Transportation Needs (04/01/2023)  Utilities: Not At Risk (04/01/2023)  Depression (PHQ2-9): Medium Risk (02/11/2023)  Financial Resource Strain: Low Risk  (11/18/2022)  Physical Activity: Inactive (11/18/2022)  Social Connections: Socially Integrated (04/01/2023)  Stress: No Stress Concern Present (11/18/2022)  Tobacco Use: Medium Risk (03/31/2023)  Health Literacy: Adequate Health Literacy (11/18/2022)   SDOH Interventions:    Readmission Risk Interventions    07/25/2022   10:35 AM  Readmission Risk Prevention Plan  Transportation Screening Complete  PCP or Specialist Appt within 5-7 Days Complete  Home Care Screening Complete  Medication Review (RN CM) Complete

## 2023-04-02 NOTE — Evaluation (Signed)
 Physical Therapy One Time Evaluation and Discharge from Acute PT Patient Details Name: JANCY SPRANKLE MRN: [id number] DOB: 06/26/1945 Today's Date: 04/02/2023  History of Present Illness  78 year old female admitted 03/31/23 with working diagnosis of acute hypoxic respiratory failure with hypoxia due to COPD exacerbation. PMH of asthma/COPD, CVA, HTN, morbid obesity and GERD.  Clinical Impression  Patient evaluated by Physical Therapy with no further acute PT needs identified. All education has been completed and the patient has no further questions.  See below for any follow-up Physical Therapy or equipment needs. PT is signing off. Thank you for this referral.  Pt very pleasant and motivated to return to her previous independent status.  Pt able to ambulate around room however limited distance due to fatigue and dyspnea.  SpO2 91% on room air upon returning to recliner so left off O2 [place] (but within reach for pt) and RN notified.  Pt would benefit from mobilizing with staff during remainder of admission.         If plan is discharge home, recommend the following:     Can travel by private vehicle        Equipment Recommendations None recommended by PT  Recommendations for Other Services       Functional Status Assessment Patient has had a recent decline in their functional status and demonstrates the ability to make significant improvements in function in a reasonable and predictable amount of time.     Precautions / Restrictions Precautions Precautions: None      Mobility  Bed Mobility               General bed mobility comments: OOB in recliner    Transfers Overall transfer level: Modified independent Equipment used: None               General transfer comment: slow transfers, reliant on UE assist    Ambulation/Gait Ambulation/Gait assistance: Supervision Gait Distance (Feet): 40 Feet Assistive device: None Gait Pattern/deviations: Step-through pattern,  Decreased stride length       General Gait Details: slow but steady ambulation around room, SPO2 91% on room air upon returning to recliner, distance limited due to fatigue/SOB  Stairs            Wheelchair Mobility     Tilt Bed    Modified Rankin (Stroke Patients Only)       Balance                                             Pertinent Vitals/Pain Pain Assessment Pain Assessment: No/denies pain    Home Living Family/patient expects to be discharged to:: Private residence Living Arrangements: Other relatives (granddaughter, great grandaughter 6 y.o.) Available Help at Discharge: Family;Available PRN/intermittently Type of Home: House Home Access: Stairs to enter Entrance Stairs-Rails: None Entrance Stairs-Number of Steps: 3   Home Layout: One level Home Equipment: Cane - single point      Prior Function Prior Level of Function : Independent/Modified Independent;Driving             Mobility Comments: uses cane intermittently ADLs Comments: independent     Extremity/Trunk Assessment   Upper Extremity Assessment Upper Extremity Assessment: Right hand dominant;Overall [facility] for tasks assessed    Lower Extremity Assessment Lower Extremity Assessment: Overall WFL for tasks assessed    Cervical / Trunk Assessment Cervical / Trunk Assessment: Other  exceptions Cervical / Trunk Exceptions: body habitus  Communication   Communication Communication: No apparent difficulties  Cognition Arousal: Alert Behavior During Therapy: WFL for tasks assessed/performed Overall Cognitive Status: Within Functional Limits for tasks assessed                                          General Comments General comments (skin integrity, edema, etc.): Pt requested to walk within room. No device used. SpO2 on 2L [place] 97%, 96% on RA at rest sitting, 91% while ambulating on RA. Pt presented with audible wheezing and decreased activity tolerance.  Required an extended sitting rest break to recover breathing. 2L O2 replaced at end of session.    Exercises     Assessment/Plan    PT Assessment Patient does not need any further PT services  PT Problem List         PT Treatment Interventions      PT Goals (Current goals can be found in the Care Plan section)  Acute Rehab PT Goals PT Goal Formulation: All assessment and education complete, DC therapy    Frequency       Co-evaluation               AM-PAC PT 6 Clicks Mobility  Outcome Measure Help needed turning from your back to your side while in a flat bed without using bedrails?: None Help needed moving from lying on your back to sitting on the side of a flat bed without using bedrails?: None Help needed moving to and from a bed to a chair (including a wheelchair)?: None Help needed standing up from a chair using your arms (e.g., wheelchair or bedside chair)?: None Help needed to walk in hospital room?: None Help needed climbing 3-5 steps with a railing? : A Little 6 Click Score: 23    End of Session   Activity Tolerance: Patient tolerated treatment well Patient left: in chair;with call bell/phone within reach Nurse Communication: Mobility status PT Visit Diagnosis: Difficulty in walking, not elsewhere classified (R26.2)    Time: 8441-8384 PT Time Calculation (min) (ACUTE ONLY): 17 min   Charges:   PT Evaluation $PT Eval Low Complexity: 1 Low   PT General Charges $$ ACUTE PT VISIT: 1 Visit        Tari KLEIN, DPT Physical Therapist Acute Rehabilitation Services Office: [phone]  Tari CROME [place] 04/02/2023, 5:15 PM

## 2023-04-03 DIAGNOSIS — I10 Essential (primary) hypertension: Secondary | ICD-10-CM

## 2023-04-03 DIAGNOSIS — R0902 Hypoxemia: Secondary | ICD-10-CM | POA: Diagnosis not present

## 2023-04-03 DIAGNOSIS — E785 Hyperlipidemia, unspecified: Secondary | ICD-10-CM | POA: Diagnosis not present

## 2023-04-03 DIAGNOSIS — J441 Chronic obstructive pulmonary disease with (acute) exacerbation: Secondary | ICD-10-CM | POA: Diagnosis not present

## 2023-04-03 DIAGNOSIS — G4733 Obstructive sleep apnea (adult) (pediatric): Secondary | ICD-10-CM

## 2023-04-03 MED ORDER — TORSEMIDE 20 MG PO TABS
20.0000 mg | ORAL_TABLET | Freq: Every day | ORAL | Status: DC
  Administered 2023-04-03 – 2023-04-04 (×2): 20 mg via ORAL
  Filled 2023-04-03 (×2): qty 1

## 2023-04-03 MED ORDER — BUDESONIDE 0.25 MG/2ML IN SUSP
0.2500 mg | Freq: Two times a day (BID) | RESPIRATORY_TRACT | Status: DC
  Administered 2023-04-03 – 2023-04-07 (×8): 0.25 mg via RESPIRATORY_TRACT
  Filled 2023-04-03 (×8): qty 2

## 2023-04-03 MED ORDER — ARFORMOTEROL TARTRATE 15 MCG/2ML IN NEBU
15.0000 ug | INHALATION_SOLUTION | Freq: Two times a day (BID) | RESPIRATORY_TRACT | Status: DC
  Administered 2023-04-03 – 2023-04-07 (×8): 15 ug via RESPIRATORY_TRACT
  Filled 2023-04-03 (×8): qty 2

## 2023-04-03 MED ORDER — METHYLPREDNISOLONE SODIUM SUCC 125 MG IJ SOLR
80.0000 mg | INTRAMUSCULAR | Status: DC
  Administered 2023-04-03 – 2023-04-04 (×2): 80 mg via INTRAVENOUS
  Filled 2023-04-03 (×2): qty 2

## 2023-04-03 MED ORDER — BENZONATATE 100 MG PO CAPS
200.0000 mg | ORAL_CAPSULE | Freq: Three times a day (TID) | ORAL | Status: DC
  Administered 2023-04-03 – 2023-04-07 (×12): 200 mg via ORAL
  Filled 2023-04-03 (×12): qty 2

## 2023-04-03 NOTE — Plan of Care (Signed)

## 2023-04-03 NOTE — Progress Notes (Signed)
 Occupational Therapy Treatment Patient Details Name: Autumn Young MRN: [id number] DOB: Jun 26, 1945 Today's Date: 04/03/2023   History of present illness 78 year old female admitted 03/31/23 with working diagnosis of acute hypoxic respiratory failure with hypoxia due to COPD exacerbation. PMH of asthma/COPD, CVA, HTN, morbid obesity and GERD.   OT comments  The pt was seen for education and recommendations as pertains to implementing energy conservation strategies during and ADLs and household IADLs. Examples provided included taking rest breaks as needed, performing tasks in sitting, use of DME/AE as needed, and general task simplification strategies. A relevant educational handout was issued to the pt. The pt verbalized understanding. No further OT needs identified. OT will sign off and recommend she return home at discharge.       If plan is discharge home, recommend the following:  Assistance with Environmental Health Practitioner    Recommendations for Other Services      Precautions / Restrictions Restrictions Weight Bearing Restrictions Per Provider Order: No       Mobility Bed Mobility      General bed mobility comments: pt found seated EOB       Balance     Sitting balance-Leahy Scale: Good         ADL either performed or assessed with clinical judgement   ADL Overall ADL's : Modified independent;Independent;At baseline              Cognition Arousal: Alert Behavior During Therapy: [facility] for tasks assessed/performed Overall Cognitive Status: Within Functional Limits for tasks assessed                      Pertinent Vitals/ Pain       Pain Assessment Pain Assessment: No/denies pain         Frequency   (N/A)        Progress Toward Goals  OT Goals(current goals can now be found in the care plan section)  Progress towards OT goals: Goals met/education completed, patient discharged from OT     Plan          AM-PAC OT 6 Clicks Daily Activity     Outcome Measure   Help from another person eating meals?: None Help from another person taking care of personal grooming?: None Help from another person toileting, which includes using toliet, bedpan, or urinal?: None Help from another person bathing (including washing, rinsing, drying)?: None Help from another person to put on and taking off regular upper body clothing?: None Help from another person to put on and taking off regular lower body clothing?: None 6 Click Score: 24    End of Session Equipment Utilized During Treatment: Other (comment) (N/A)  OT Visit Diagnosis: Muscle weakness (generalized) (M62.81)   Activity Tolerance Patient tolerated treatment well   Patient Left in bed;with call bell/phone within reach   Nurse Communication Other (comment)        Time: 8740-8678 OT Time Calculation (min): 22 min  Charges: OT General Charges $OT Visit: 1 Visit OT Treatments $Self Care/Home Management : 8-22 mins     Delanna LITTIE Molt, OTR/L 04/03/2023, 2:52 PM

## 2023-04-03 NOTE — Progress Notes (Signed)
 [facility]                                                                              Autumn Young, is a 78 y.o. female, DOB - 19-Jun-1945, FMW:[id number] Admit date - 03/31/2023    Outpatient Primary MD for the patient is Sagardia, Emil Schanz, MD  LOS - 2  days  Chief Complaint  Patient presents with   Shortness of Breath       Brief summary   Patient is a 78 year old female with asthma/COPD, CVA, HTN, morbid obesity and GERD presented with productive cough, wheezing and shortness of breath for about a week and hypoxia.  Grandchildren with influenza.   In ED, Slightly hypertensive.  No desaturation but started on 2 L.  Labs significant for hypokalemia and anemia.  RVP negative.  Portable CXR without significant finding.  Patient was admitted for further workup.   Assessment & Plan    Principal Problem:   Acute exacerbation of chronic obstructive pulmonary disease (COPD) (HCC) -Still diffusely wheezing, coughing and short of breath. -Respiratory virus panel negative, flu, RSV, COVID-negative -Continue scheduled DuoNebs, added Pulmicort , Brovana  -Placed on IV Solu-Medrol  80 mg every 24 hours -Continue Mucinex , Tessalon  Perles -Currently O2 sats 97% on 3 L O2 via [place]   Active Problems: Atypical chest pain -Resolved, EKG with no acute ST-T wave changes, troponins negative  Mild acute on chronic heart failure with preserved ejection fraction (HCC) -Patient taking torsemide  20 mg daily as needed at home -D echo 08/2022 had shown EF of 60 to 65% with G1 DD -Chest x-ray on admission showed cardiomegaly with some vascular congestion -I's and O's with 1.3 L positive, placed on torsemide  20 mg daily    Essential hypertension -BP stable, continue amlodipine , Avapro     OSA (obstructive sleep apnea) -New CPAP    History of CVA (cerebrovascular accident) -Continue Plavix , not on statin    Dyslipidemia -On omega-3 fatty acids, not on statin    Morbid obesity  (HCC) Estimated body mass index is 46.9 kg/m as calculated from the following:   Height as of this encounter: 5' 6 (1.676 m).   Weight as of this encounter: 131.8 kg.  Code Status: Full code DVT Prophylaxis:  SCDs Start: 04/01/23 1426   Level of Care: Level of care: Med-Surg Family Communication: Updated patient Disposition Plan:      Remains inpatient appropriate:      Procedures:    Consultants:     Antimicrobials:   Anti-infectives (From admission, onward)    None          Medications  amLODipine   10 mg Oral Daily   arformoterol   15 mcg Nebulization BID   benzonatate   200 mg Oral TID   budesonide  (PULMICORT ) nebulizer solution  0.25 mg Nebulization BID   clopidogrel   75 mg Oral q morning   enoxaparin  (LOVENOX ) injection  60 mg Subcutaneous Q24H   guaiFENesin   600 mg Oral BID   ipratropium-albuterol   3 mL Nebulization Q6H   irbesartan   300 mg Oral Daily   methylPREDNISolone  (SOLU-MEDROL ) injection  80 mg Intravenous Q24H   montelukast   10 mg Oral  QHS   pantoprazole   40 mg Oral QAC breakfast      Subjective:   Autumn Young was seen and examined today.  Wheezing and short of breath.  No acute chest pain.  Patient denies dizziness, abdominal pain, N/V/D/C, new weakness, numbess, tingling. No acute events overnight.  +Cough, no fever  Objective:   Vitals:   04/02/23 2202 04/02/23 2315 04/03/23 0632 04/03/23 0847  BP: (!) 173/67 (!) 154/65 (!) 147/68   Pulse: 69  68   Resp: 17  16   Temp: 97.8 F (36.6 C)  97.8 F (36.6 C)   TempSrc: Oral  Oral   SpO2: 97%  98% 97%  Weight:      Height:        Intake/Output Summary (Last 24 hours) at 04/03/2023 1320 Last data filed at 04/03/2023 9367 Gross per 24 hour  Intake 360 ml  Output --  Net 360 ml     Wt Readings from Last 3 Encounters:  04/01/23 131.8 kg  02/12/23 (!) 137.1 kg  02/11/23 (!) 138.2 kg     Exam General: Alert and oriented x 3, NAD Cardiovascular: S1 S2 auscultated,   RRR Respiratory: Bilateral expiratory wheezing Gastrointestinal: Soft, nontender, nondistended, + bowel sounds Ext: no pedal edema bilaterally Neuro: Strength 5/5 upper and lower extremities bilaterally psych: Normal affect     Data Reviewed:  I have personally reviewed following labs    CBC Lab Results  Component Value Date   WBC 10.3 04/02/2023   RBC 3.91 04/02/2023   HGB 10.2 (L) 04/02/2023   HCT 34.1 (L) 04/02/2023   MCV 87.2 04/02/2023   MCH 26.1 04/02/2023   PLT 262 04/02/2023   MCHC 29.9 (L) 04/02/2023   RDW 14.2 04/02/2023   LYMPHSABS 1.6 02/11/2023   MONOABS 0.6 02/11/2023   EOSABS 0.1 02/11/2023   BASOSABS 0.0 02/11/2023     Last metabolic panel Lab Results  Component Value Date   NA 135 04/02/2023   K 4.1 04/02/2023   CL 105 04/02/2023   CO2 22 04/02/2023   BUN 17 04/02/2023   CREATININE 0.82 04/02/2023   GLUCOSE 156 (H) 04/02/2023   GFRNONAA >60 04/02/2023   GFRAA >60 08/13/2017   CALCIUM 8.5 (L) 04/02/2023   PROT 8.1 02/11/2023   ALBUMIN 3.9 02/11/2023   BILITOT 0.5 02/11/2023   ALKPHOS 71 02/11/2023   AST 34 02/11/2023   ALT 21 02/11/2023   ANIONGAP 8 04/02/2023    CBG (last 3)  No results for input(s): GLUCAP in the last 72 hours.    Coagulation Profile: No results for input(s): INR, PROTIME in the last 168 hours.   Radiology Studies: I have personally reviewed the imaging studies  DG Chest 2 View Result Date: 04/02/2023 CLINICAL DATA:  Shortness of breath and chest pain EXAM: CHEST - 2 VIEW COMPARISON:  X-ray 03/31/2023. FINDINGS: Enlarged cardiopericardial silhouette with some vascular congestion. Pacemaker. No pneumothorax, effusion or edema. Films are under penetrated. Degenerative changes are seen of the spine on lateral view. No consolidation. IMPRESSION: Enlarged heart with a pacemaker. Prominence of the central vasculature. Electronically Signed   By: Ranell Bring M.D.   On: 04/02/2023 14:27       Autumn Young  M.D. [facility] 04/03/2023, 1:20 PM  Available via Epic secure chat 7am-7pm After 7 pm, please refer to night coverage provider listed on amion.

## 2023-04-04 DIAGNOSIS — Z8673 Personal history of transient ischemic attack (TIA), and cerebral infarction without residual deficits: Secondary | ICD-10-CM | POA: Diagnosis not present

## 2023-04-04 DIAGNOSIS — J441 Chronic obstructive pulmonary disease with (acute) exacerbation: Secondary | ICD-10-CM | POA: Diagnosis not present

## 2023-04-04 DIAGNOSIS — E785 Hyperlipidemia, unspecified: Secondary | ICD-10-CM | POA: Diagnosis not present

## 2023-04-04 DIAGNOSIS — R0902 Hypoxemia: Secondary | ICD-10-CM | POA: Diagnosis not present

## 2023-04-04 LAB — BASIC METABOLIC PANEL
Anion gap: 7 (ref 5–15)
BUN: 20 mg/dL (ref 8–23)
CO2: 30 mmol/L (ref 22–32)
Calcium: 8.3 mg/dL — ABNORMAL LOW (ref 8.9–10.3)
Chloride: 100 mmol/L (ref 98–111)
Creatinine, Ser: 0.84 mg/dL (ref 0.44–1.00)
GFR, Estimated: >60 mL/min (ref >60)
Glucose, Bld: 162 mg/dL — ABNORMAL HIGH (ref 70–99)
Potassium: 4 mmol/L (ref 3.5–5.1)
Sodium: 137 mmol/L (ref 135–145)

## 2023-04-04 LAB — CBC
HCT: 35.7 % — ABNORMAL LOW (ref 36.0–46.0)
Hemoglobin: 10.7 g/dL — ABNORMAL LOW (ref 12.0–15.0)
MCH: 26.3 pg (ref 26.0–34.0)
MCHC: 30 g/dL (ref 30.0–36.0)
MCV: 87.7 fL (ref 80.0–100.0)
Platelets: 303 10*3/uL (ref 150–400)
RBC: 4.07 MIL/uL (ref 3.87–5.11)
RDW: 14.3 % (ref 11.5–15.5)
WBC: 11 10*3/uL — ABNORMAL HIGH (ref 4.0–10.5)
nRBC: 0 % (ref 0.0–0.2)

## 2023-04-04 NOTE — Plan of Care (Signed)
   Problem: Activity: Goal: Risk for activity intolerance will decrease Outcome: Progressing   Problem: Coping: Goal: Level of anxiety will decrease Outcome: Progressing   Problem: Safety: Goal: Ability to remain free from injury will improve Outcome: Progressing

## 2023-04-04 NOTE — Progress Notes (Signed)
 [facility]                                                                              Autumn Young, is a 78 y.o. female, DOB - 12/31/1945, FMW:[id number] Admit date - 03/31/2023    Outpatient Primary MD for the patient is Sagardia, Emil Schanz, MD  LOS - 3  days  Chief Complaint  Patient presents with   Shortness of Breath       Brief summary   Patient is a 78 year old female with asthma/COPD, CVA, HTN, morbid obesity and GERD presented with productive cough, wheezing and shortness of breath for about a week and hypoxia.  Grandchildren with influenza.   In ED, Slightly hypertensive.  No desaturation but started on 2 L.  Labs significant for hypokalemia and anemia.  RVP negative.  Portable CXR without significant finding.  Patient was admitted for further workup.   Assessment & Plan    Principal Problem:   Acute exacerbation of chronic obstructive pulmonary disease (COPD) (HCC) -Respiratory virus panel negative, flu, RSV, COVID-negative -Continue Mucinex , Tessalon  Perles -States feeling somewhat better today, still wheezing though. -Will continue current management with scheduled DuoNebs, Pulmicort , Brovana  -Continue IV steroids -Wean O2 as tolerated, will need home O2 evaluation prior to discharge.  Active Problems: Atypical chest pain -Resolved, EKG with no acute ST-T wave changes, troponins negative  Mild acute on chronic heart failure with preserved ejection fraction (HCC) -Patient taking torsemide  20 mg daily as needed at home -D echo 08/2022 had shown EF of 60 to 65% with G1 DD -Chest x-ray on admission showed cardiomegaly with some vascular congestion -I's and O's with + 750 cc positive, continue torsemide  20 mg daily     Essential hypertension -BP stable, continue amlodipine , Avapro     OSA (obstructive sleep apnea) -cont CPAP    History of CVA (cerebrovascular accident) -Continue Plavix , not on statin, allergic to Zocor    Dyslipidemia -On  omega-3 fatty acids, not on statin, allergic to Zocor    Morbid obesity (HCC) Estimated body mass index is 46.9 kg/m as calculated from the following:   Height as of this encounter: 5' 6 (1.676 m).   Weight as of this encounter: 131.8 kg.  Code Status: Full code DVT Prophylaxis:  SCDs Start: 04/01/23 1426   Level of Care: Level of care: Med-Surg Family Communication: Updated patient Disposition Plan:      Remains inpatient appropriate: Still wheezing   Procedures:    Consultants:     Antimicrobials:   Anti-infectives (From admission, onward)    None          Medications  amLODipine   10 mg Oral Daily   arformoterol   15 mcg Nebulization BID   benzonatate   200 mg Oral TID   budesonide  (PULMICORT ) nebulizer solution  0.25 mg Nebulization BID   clopidogrel   75 mg Oral q morning   enoxaparin  (LOVENOX ) injection  60 mg Subcutaneous Q24H   guaiFENesin   600 mg Oral BID   ipratropium-albuterol   3 mL Nebulization Q6H   irbesartan   300 mg Oral Daily   methylPREDNISolone  (SOLU-MEDROL ) injection  80 mg Intravenous Q24H   montelukast   10 mg Oral QHS   pantoprazole   40 mg Oral QAC breakfast   torsemide   20 mg Oral Daily      Subjective:   Autumn Young was seen and examined today.  Overall mildly improved today.  Still wheezing, coughing and some shortness of breath.  Patient denies dizziness, abdominal pain, N/V/D/C, new weakness, numbess, tingling.  No fevers. Objective:   Vitals:   04/03/23 2130 04/04/23 0140 04/04/23 0624 04/04/23 0945  BP: (!) 155/63  (!) 162/80   Pulse: 70 78 71   Resp: 18 (!) 22 19   Temp: 97.9 F (36.6 C)  97.9 F (36.6 C)   TempSrc: Oral  Oral   SpO2: 97% 97% 100% 96%  Weight:      Height:        Intake/Output Summary (Last 24 hours) at 04/04/2023 1212 Last data filed at 04/04/2023 9177 Gross per 24 hour  Intake 330 ml  Output 950 ml  Net -620 ml     Wt Readings from Last 3 Encounters:  04/01/23 131.8 kg  02/12/23 (!) 137.1 kg   02/11/23 (!) 138.2 kg   Physical Exam General: Alert and oriented x 3, NAD Cardiovascular: S1 S2 clear, RRR.  Respiratory: Bilateral expiratory wheezing Gastrointestinal: Soft, nontender, nondistended, NBS Ext: no pedal edema bilaterally Neuro: no new deficits Psych: Normal affect      Data Reviewed:  I have personally reviewed following labs    CBC Lab Results  Component Value Date   WBC 11.0 (H) 04/04/2023   RBC 4.07 04/04/2023   HGB 10.7 (L) 04/04/2023   HCT 35.7 (L) 04/04/2023   MCV 87.7 04/04/2023   MCH 26.3 04/04/2023   PLT 303 04/04/2023   MCHC 30.0 04/04/2023   RDW 14.3 04/04/2023   LYMPHSABS 1.6 02/11/2023   MONOABS 0.6 02/11/2023   EOSABS 0.1 02/11/2023   BASOSABS 0.0 02/11/2023     Last metabolic panel Lab Results  Component Value Date   NA 137 04/04/2023   K 4.0 04/04/2023   CL 100 04/04/2023   CO2 30 04/04/2023   BUN 20 04/04/2023   CREATININE 0.84 04/04/2023   GLUCOSE 162 (H) 04/04/2023   GFRNONAA >60 04/04/2023   GFRAA >60 08/13/2017   CALCIUM 8.3 (L) 04/04/2023   PROT 8.1 02/11/2023   ALBUMIN 3.9 02/11/2023   BILITOT 0.5 02/11/2023   ALKPHOS 71 02/11/2023   AST 34 02/11/2023   ALT 21 02/11/2023   ANIONGAP 7 04/04/2023    CBG (last 3)  No results for input(s): GLUCAP in the last 72 hours.    Coagulation Profile: No results for input(s): INR, PROTIME in the last 168 hours.   Radiology Studies: I have personally reviewed the imaging studies  No results found.      Autumn Young M.D. [facility] 04/04/2023, 12:12 PM  Available via Epic secure chat 7am-7pm After 7 pm, please refer to night coverage provider listed on amion.

## 2023-04-05 ENCOUNTER — Other Ambulatory Visit: Payer: Self-pay | Admitting: Cardiology

## 2023-04-05 DIAGNOSIS — J441 Chronic obstructive pulmonary disease with (acute) exacerbation: Secondary | ICD-10-CM | POA: Diagnosis not present

## 2023-04-05 DIAGNOSIS — R0902 Hypoxemia: Secondary | ICD-10-CM | POA: Diagnosis not present

## 2023-04-05 DIAGNOSIS — E785 Hyperlipidemia, unspecified: Secondary | ICD-10-CM | POA: Diagnosis not present

## 2023-04-05 LAB — CBC
HCT: 36.2 % (ref 36.0–46.0)
Hemoglobin: 11.1 g/dL — ABNORMAL LOW (ref 12.0–15.0)
MCH: 26.5 pg (ref 26.0–34.0)
MCHC: 30.7 g/dL (ref 30.0–36.0)
MCV: 86.4 fL (ref 80.0–100.0)
Platelets: 324 10*3/uL (ref 150–400)
RBC: 4.19 MIL/uL (ref 3.87–5.11)
RDW: 14.2 % (ref 11.5–15.5)
WBC: 11.3 10*3/uL — ABNORMAL HIGH (ref 4.0–10.5)
nRBC: 0 % (ref 0.0–0.2)

## 2023-04-05 LAB — BASIC METABOLIC PANEL
Anion gap: 9 (ref 5–15)
BUN: 23 mg/dL (ref 8–23)
CO2: 29 mmol/L (ref 22–32)
Calcium: 8.6 mg/dL — ABNORMAL LOW (ref 8.9–10.3)
Chloride: 99 mmol/L (ref 98–111)
Creatinine, Ser: 0.74 mg/dL (ref 0.44–1.00)
GFR, Estimated: >60 mL/min (ref >60)
Glucose, Bld: 154 mg/dL — ABNORMAL HIGH (ref 70–99)
Potassium: 3.9 mmol/L (ref 3.5–5.1)
Sodium: 137 mmol/L (ref 135–145)

## 2023-04-05 MED ORDER — METHYLPREDNISOLONE SODIUM SUCC 40 MG IJ SOLR
40.0000 mg | INTRAMUSCULAR | Status: DC
  Administered 2023-04-05 – 2023-04-06 (×2): 40 mg via INTRAVENOUS
  Filled 2023-04-05 (×2): qty 1

## 2023-04-05 MED ORDER — BUMETANIDE 0.25 MG/ML IJ SOLN
0.5000 mg | Freq: Once | INTRAMUSCULAR | Status: AC
  Administered 2023-04-05: 0.5 mg via INTRAVENOUS
  Filled 2023-04-05: qty 10

## 2023-04-05 MED ORDER — TORSEMIDE 20 MG PO TABS
20.0000 mg | ORAL_TABLET | Freq: Every day | ORAL | Status: DC
  Administered 2023-04-05 – 2023-04-06 (×2): 20 mg via ORAL
  Filled 2023-04-05 (×2): qty 1

## 2023-04-05 NOTE — Plan of Care (Signed)
   Problem: Clinical Measurements: Goal: Respiratory complications will improve Outcome: Progressing   Problem: Activity: Goal: Risk for activity intolerance will decrease Outcome: Progressing   Problem: Safety: Goal: Ability to remain free from injury will improve Outcome: Progressing

## 2023-04-05 NOTE — Plan of Care (Signed)
  Problem: Health Behavior/Discharge Planning: Goal: Ability to manage health-related needs will improve Outcome: Progressing   Problem: Clinical Measurements: Goal: Ability to maintain clinical measurements within normal limits will improve Outcome: Progressing Goal: Will remain free from infection Outcome: Progressing Goal: Diagnostic test results will improve Outcome: Progressing Goal: Respiratory complications will improve Outcome: Progressing Goal: Cardiovascular complication will be avoided Outcome: Progressing   Problem: Activity: Goal: Risk for activity intolerance will decrease Outcome: Progressing   Problem: Coping: Goal: Level of anxiety will decrease Outcome: Progressing   Problem: Safety: Goal: Ability to remain free from injury will improve Outcome: Progressing   Problem: Skin Integrity: Goal: Risk for impaired skin integrity will decrease Outcome: Progressing

## 2023-04-05 NOTE — Progress Notes (Signed)
 [facility]                                                                              Autumn Young, is a 78 y.o. female, DOB - [birth year], FMW:[id number] Admit date - 03/31/2023    Outpatient Primary MD for the patient is Sagardia, Emil Schanz, MD  LOS - 4  days  Chief Complaint  Patient presents with   Shortness of Breath       Brief summary   Patient is a 78 year old female with asthma/COPD, CVA, HTN, morbid obesity and GERD presented with productive cough, wheezing and shortness of breath for about a week and hypoxia.  Grandchildren with influenza.   In ED, Slightly hypertensive.  No desaturation but started on 2 L.  Labs significant for hypokalemia and anemia.  RVP negative.  Portable CXR without significant finding.  Patient was admitted for further workup.   Assessment & Plan    Principal Problem:   Acute exacerbation of chronic obstructive pulmonary disease (COPD) (HCC) -Respiratory virus panel negative, flu, RSV, COVID-negative -Continue Mucinex , Tessalon  Perles -Slowly improving, still has wheezing. -Continue Pulmicort , Brovana , scheduled DuoNebs, IV steroids -Taper IV Solu-Medrol  to 40mg  q24hrs  -Wean O2 as tolerated, will need home O2 evaluation prior to discharge.  Active Problems: Atypical chest pain -Resolved, EKG with no acute ST-T wave changes, troponins negative  Mild acute on chronic heart failure with preserved ejection fraction (HCC) -Patient taking torsemide  20 mg daily as needed at home -D echo 08/2022 had shown EF of 60 to 65% with G1 DD -Chest x-ray on admission showed cardiomegaly with some vascular congestion -Peripheral edema with 2+ pitting edema, will continue torsemide  20 mg daily, bumex  0.5 mg IV x 1     Essential hypertension -BP stable, continue amlodipine , Avapro     OSA (obstructive sleep apnea) -cont CPAP    History of CVA (cerebrovascular accident) -Continue Plavix , not on statin, allergic to Zocor     Dyslipidemia -On omega-3 fatty acids, not on statin, allergic to Zocor    Morbid obesity (HCC) Estimated body mass index is 46.9 kg/m as calculated from the following:   Height as of this encounter: 5' 6 (1.676 m).   Weight as of this encounter: 131.8 kg.  Code Status: Full code DVT Prophylaxis:  SCDs Start: 04/01/23 1426   Level of Care: Level of care: Med-Surg Family Communication: Updated patient Disposition Plan:      Remains inpatient appropriate:    Procedures:    Consultants:     Antimicrobials:   Anti-infectives (From admission, onward)    None          Medications  amLODipine   10 mg Oral Daily   arformoterol   15 mcg Nebulization BID   benzonatate   200 mg Oral TID   budesonide  (PULMICORT ) nebulizer solution  0.25 mg Nebulization BID   clopidogrel   75 mg Oral q morning   enoxaparin  (LOVENOX ) injection  60 mg Subcutaneous Q24H   guaiFENesin   600 mg Oral BID   ipratropium-albuterol   3 mL Nebulization Q6H   irbesartan   300 mg Oral Daily   methylPREDNISolone  (SOLU-MEDROL ) injection  80 mg Intravenous Q24H  montelukast   10 mg Oral QHS   pantoprazole   40 mg Oral QAC breakfast   torsemide   20 mg Oral Daily      Subjective:   Autumn Young was seen and examined today.  States mildly improving, noted swelling in the legs.  Still has wheezing, coughing but no fevers or chills.  Patient denies dizziness, abdominal pain, N/V/D/C, new weakness, numbess, tingling.   Objective:   Vitals:   04/04/23 2150 04/05/23 0244 04/05/23 0615 04/05/23 0909  BP: (!) 161/67  (!) 171/97   Pulse: 67  76   Resp: 18  17   Temp: 97.6 F (36.4 C)  97.9 F (36.6 C)   TempSrc: Oral  Oral   SpO2: 95% 96% 94% 94%  Weight:      Height:        Intake/Output Summary (Last 24 hours) at 04/05/2023 1322 Last data filed at 04/05/2023 0900 Gross per 24 hour  Intake 120 ml  Output --  Net 120 ml     Wt Readings from Last 3 Encounters:  04/01/23 131.8 kg  02/12/23 (!) 137.1 kg   02/11/23 (!) 138.2 kg    Physical Exam General: Alert and oriented x 3, NAD Cardiovascular: S1 S2 clear, RRR.  Respiratory: Bilateral expiratory wheezing Gastrointestinal: Soft, nontender, nondistended, NBS Ext: 2+ no pedal edema bilaterally Neuro: no new deficits Psych: Normal affect       Data Reviewed:  I have personally reviewed following labs    CBC Lab Results  Component Value Date   WBC 11.3 (H) 04/05/2023   RBC 4.19 04/05/2023   HGB 11.1 (L) 04/05/2023   HCT 36.2 04/05/2023   MCV 86.4 04/05/2023   MCH 26.5 04/05/2023   PLT 324 04/05/2023   MCHC 30.7 04/05/2023   RDW 14.2 04/05/2023   LYMPHSABS 1.6 02/11/2023   MONOABS 0.6 02/11/2023   EOSABS 0.1 02/11/2023   BASOSABS 0.0 02/11/2023     Last metabolic panel Lab Results  Component Value Date   NA 137 04/05/2023   K 3.9 04/05/2023   CL 99 04/05/2023   CO2 29 04/05/2023   BUN 23 04/05/2023   CREATININE 0.74 04/05/2023   GLUCOSE 154 (H) 04/05/2023   GFRNONAA >60 04/05/2023   GFRAA >60 08/13/2017   CALCIUM 8.6 (L) 04/05/2023   PROT 8.1 02/11/2023   ALBUMIN 3.9 02/11/2023   BILITOT 0.5 02/11/2023   ALKPHOS 71 02/11/2023   AST 34 02/11/2023   ALT 21 02/11/2023   ANIONGAP 9 04/05/2023    CBG (last 3)  No results for input(s): GLUCAP in the last 72 hours.    Coagulation Profile: No results for input(s): INR, PROTIME in the last 168 hours.   Radiology Studies: I have personally reviewed the imaging studies  No results found.      [name] M.D. [facility] 04/05/2023, 1:22 PM  Available via Epic secure chat 7am-7pm After 7 pm, please refer to night coverage provider listed on amion.

## 2023-04-06 DIAGNOSIS — J441 Chronic obstructive pulmonary disease with (acute) exacerbation: Secondary | ICD-10-CM | POA: Diagnosis not present

## 2023-04-06 DIAGNOSIS — E785 Hyperlipidemia, unspecified: Secondary | ICD-10-CM | POA: Diagnosis not present

## 2023-04-06 DIAGNOSIS — R0902 Hypoxemia: Secondary | ICD-10-CM | POA: Diagnosis not present

## 2023-04-06 LAB — HEMOGLOBIN A1C
Hgb A1c MFr Bld: 6.6 % — ABNORMAL HIGH (ref 4.8–5.6)
Mean Plasma Glucose: 143 mg/dL

## 2023-04-06 MED ORDER — IPRATROPIUM-ALBUTEROL 0.5-2.5 (3) MG/3ML IN SOLN
3.0000 mL | Freq: Two times a day (BID) | RESPIRATORY_TRACT | Status: DC
  Administered 2023-04-06 – 2023-04-07 (×2): 3 mL via RESPIRATORY_TRACT
  Filled 2023-04-06 (×2): qty 3

## 2023-04-06 MED ORDER — TORSEMIDE 20 MG PO TABS
40.0000 mg | ORAL_TABLET | Freq: Every day | ORAL | Status: DC
  Administered 2023-04-07: 40 mg via ORAL
  Filled 2023-04-06: qty 2

## 2023-04-06 NOTE — Progress Notes (Signed)
 [facility]                                                                              Autumn Young, is a 78 y.o. female, DOB - 06/17/1945, FMW:[id number] Admit date - 03/31/2023    Outpatient Primary MD for the patient is Sagardia, Emil Schanz, MD  LOS - 5  days  Chief Complaint  Patient presents with   Shortness of Breath       Brief summary   Patient is a 78 year old female with asthma/COPD, CVA, HTN, morbid obesity and GERD presented with productive cough, wheezing and shortness of breath for about a week and hypoxia.  Grandchildren with influenza.   In ED, Slightly hypertensive.  No desaturation but started on 2 L.  Labs significant for hypokalemia and anemia.  RVP negative.  Portable CXR without significant finding.  Patient was admitted for further workup.   Assessment & Plan    Principal Problem:   Acute exacerbation of chronic obstructive pulmonary disease (COPD) (HCC) -Respiratory virus panel negative, flu, RSV, COVID-negative -Continue Mucinex , Tessalon  Perles -still wheezing, cont current management  -Continue Pulmicort , Brovana , scheduled DuoNebs, IV steroids -Taper IV Solu-Medrol  to 40mg  q24hrs, cont PPI  -Wean O2 as tolerated, will need home O2 evaluation prior to discharge.  Active Problems: Atypical chest pain -Resolved, EKG with no acute ST-T wave changes, troponins negative  Mild acute on chronic heart failure with preserved ejection fraction (HCC) -Patient taking torsemide  20 mg daily as needed at home -D echo 08/2022 had shown EF of 60 to 65% with G1 DD -Chest x-ray on admission showed cardiomegaly with some vascular congestion - continue torsemide , increase to 40mg  daily, received bumex  0.5 mg IV x 1 on 1/5     Essential hypertension - continue amlodipine , Avapro     OSA (obstructive sleep apnea) -cont CPAP    History of CVA (cerebrovascular accident) -Continue Plavix , not on statin, allergic to Zocor    Dyslipidemia -On  omega-3 fatty acids, not on statin, allergic to Zocor    Morbid obesity (HCC) Estimated body mass index is 46.9 kg/m as calculated from the following:   Height as of this encounter: 5' 6 (1.676 m).   Weight as of this encounter: 131.8 kg.  Code Status: Full code DVT Prophylaxis:  SCDs Start: 04/01/23 1426   Level of Care: Level of care: Med-Surg Family Communication: Updated patient Disposition Plan:      Remains inpatient appropriate: slowly improving    Procedures:    Consultants:     Antimicrobials:   Anti-infectives (From admission, onward)    None          Medications  amLODipine   10 mg Oral Daily   arformoterol   15 mcg Nebulization BID   benzonatate   200 mg Oral TID   budesonide  (PULMICORT ) nebulizer solution  0.25 mg Nebulization BID   clopidogrel   75 mg Oral q morning   enoxaparin  (LOVENOX ) injection  60 mg Subcutaneous Q24H   guaiFENesin   600 mg Oral BID   ipratropium-albuterol   3 mL Nebulization BID   irbesartan   300 mg Oral Daily   methylPREDNISolone  (SOLU-MEDROL ) injection  40 mg  Intravenous Q24H   montelukast   10 mg Oral QHS   pantoprazole   40 mg Oral QAC breakfast   torsemide   20 mg Oral Daily      Subjective:   Autumn Young was seen and examined today.  Mildly improving, still wheezing. No fevers.   Patient denies dizziness, abdominal pain, N/V/D/C, new weakness, numbess, tingling.    Objective:   Vitals:   04/06/23 0152 04/06/23 0451 04/06/23 0946 04/06/23 1236  BP:  (!) 156/64  (!) 156/72  Pulse:  72  79  Resp:  18  19  Temp:  97.7 F (36.5 C)  (!) 97.5 F (36.4 C)  TempSrc:    Oral  SpO2: 95% 94% 96% 96%  Weight:      Height:       No intake or output data in the 24 hours ending 04/06/23 1416    Wt Readings from Last 3 Encounters:  04/01/23 131.8 kg  02/12/23 (!) 137.1 kg  02/11/23 (!) 138.2 kg   Physical Exam General: Alert and oriented x 3, NAD Cardiovascular: S1 S2 clear, RRR.  Respiratory: + b/l exp wheezing   Gastrointestinal: Soft, nontender, nondistended, NBS Ext: 1+  pedal edema bilaterally Neuro: no new deficits Skin: No rashes Psych: Normal affect        Data Reviewed:  I have personally reviewed following labs    CBC Lab Results  Component Value Date   WBC 11.3 (H) 04/05/2023   RBC 4.19 04/05/2023   HGB 11.1 (L) 04/05/2023   HCT 36.2 04/05/2023   MCV 86.4 04/05/2023   MCH 26.5 04/05/2023   PLT 324 04/05/2023   MCHC 30.7 04/05/2023   RDW 14.2 04/05/2023   LYMPHSABS 1.6 02/11/2023   MONOABS 0.6 02/11/2023   EOSABS 0.1 02/11/2023   BASOSABS 0.0 02/11/2023     Last metabolic panel Lab Results  Component Value Date   NA 137 04/05/2023   K 3.9 04/05/2023   CL 99 04/05/2023   CO2 29 04/05/2023   BUN 23 04/05/2023   CREATININE 0.74 04/05/2023   GLUCOSE 154 (H) 04/05/2023   GFRNONAA >60 04/05/2023   GFRAA >60 08/13/2017   CALCIUM 8.6 (L) 04/05/2023   PROT 8.1 02/11/2023   ALBUMIN 3.9 02/11/2023   BILITOT 0.5 02/11/2023   ALKPHOS 71 02/11/2023   AST 34 02/11/2023   ALT 21 02/11/2023   ANIONGAP 9 04/05/2023    CBG (last 3)  No results for input(s): GLUCAP in the last 72 hours.    Coagulation Profile: No results for input(s): INR, PROTIME in the last 168 hours.   Radiology Studies: I have personally reviewed the imaging studies  No results found.      [name] M.D. [facility] 04/06/2023, 2:16 PM  Available via Epic secure chat 7am-7pm After 7 pm, please refer to night coverage provider listed on amion.

## 2023-04-06 NOTE — Progress Notes (Signed)
 Mobility Specialist - Progress Note   04/06/23 1457  Mobility  Activity Ambulated independently in hallway  Level of Assistance Independent  Assistive Device None  Distance Ambulated (ft) 200 ft  Activity Response Tolerated well  Mobility Referral Yes  Mobility visit 1 Mobility  Mobility Specialist Start Time (ACUTE ONLY) 1444  Mobility Specialist Stop Time (ACUTE ONLY) 1457  Mobility Specialist Time Calculation (min) (ACUTE ONLY) 13 min   Pt received in bed and agreeable to mobility. No complaints during session. Pt to EOB after session with all needs met.   [facility]

## 2023-04-06 NOTE — Progress Notes (Signed)
 Mobility Specialist - Progress Note   04/06/23 0942  Mobility  Activity Ambulated independently in hallway  Level of Assistance Independent  Assistive Device None  Distance Ambulated (ft) 160 ft  Activity Response Tolerated well  Mobility Referral Yes  Mobility visit 1 Mobility  Mobility Specialist Start Time (ACUTE ONLY) 0911  Mobility Specialist Stop Time (ACUTE ONLY) 0927  Mobility Specialist Time Calculation (min) (ACUTE ONLY) 16 min   Pt received in bed and agreeable to mobility. Audible wheezing throughout session. SpO2 >97% during session. Pt to EOB after session with all needs met.   [facility]

## 2023-04-06 NOTE — Telephone Encounter (Signed)
 Pt's pharmacy is requesting a refill on clopidogrel. Dr. Lalla Brothers did not prescribe this medication. Would Dr. Lalla Brothers like to refill this medication? Please address

## 2023-04-06 NOTE — Plan of Care (Signed)
  Problem: Health Behavior/Discharge Planning: Goal: Ability to manage health-related needs will improve Outcome: Progressing   Problem: Clinical Measurements: Goal: Ability to maintain clinical measurements within normal limits will improve Outcome: Progressing Goal: Will remain free from infection Outcome: Progressing Goal: Diagnostic test results will improve Outcome: Progressing Goal: Respiratory complications will improve Outcome: Progressing Goal: Cardiovascular complication will be avoided Outcome: Progressing   Problem: Activity: Goal: Risk for activity intolerance will decrease Outcome: Progressing   Problem: Coping: Goal: Level of anxiety will decrease Outcome: Progressing   Problem: Safety: Goal: Ability to remain free from injury will improve Outcome: Progressing   Problem: Skin Integrity: Goal: Risk for impaired skin integrity will decrease Outcome: Progressing

## 2023-04-06 NOTE — Progress Notes (Signed)
   04/06/23 2020  BiPAP/CPAP/SIPAP  BiPAP/CPAP/SIPAP Pt Type Adult  Reason BIPAP/CPAP not in use Non-compliant (Pt refusing cpap)  BiPAP/CPAP /SiPAP Vitals  SpO2 98 %  Bilateral Breath Sounds Expiratory wheezes

## 2023-04-07 ENCOUNTER — Ambulatory Visit: Payer: Medicare HMO | Admitting: Emergency Medicine

## 2023-04-07 ENCOUNTER — Other Ambulatory Visit (HOSPITAL_COMMUNITY): Payer: Self-pay

## 2023-04-07 ENCOUNTER — Encounter: Payer: Self-pay | Admitting: Internal Medicine

## 2023-04-07 DIAGNOSIS — R0902 Hypoxemia: Secondary | ICD-10-CM | POA: Diagnosis not present

## 2023-04-07 DIAGNOSIS — R600 Localized edema: Secondary | ICD-10-CM | POA: Diagnosis not present

## 2023-04-07 DIAGNOSIS — J441 Chronic obstructive pulmonary disease with (acute) exacerbation: Secondary | ICD-10-CM | POA: Diagnosis not present

## 2023-04-07 MED ORDER — PANTOPRAZOLE SODIUM 40 MG PO TBEC: 40.0000 mg | DELAYED_RELEASE_TABLET | Freq: Two times a day (BID) | ORAL | Status: DC

## 2023-04-07 MED ORDER — TORSEMIDE 20 MG PO TABS
20.0000 mg | ORAL_TABLET | Freq: Every day | ORAL | 3 refills | Status: DC
  Filled 2023-04-07: qty 32, 30d supply, fill #0

## 2023-04-07 MED ORDER — BENZONATATE 200 MG PO CAPS
200.0000 mg | ORAL_CAPSULE | Freq: Three times a day (TID) | ORAL | 0 refills | Status: DC | PRN
Start: 2023-04-07 — End: 2023-10-09
  Filled 2023-04-07: qty 60, 20d supply, fill #0

## 2023-04-07 MED ORDER — CLOPIDOGREL BISULFATE 75 MG PO TABS
75.0000 mg | ORAL_TABLET | Freq: Every morning | ORAL | 3 refills | Status: DC
  Filled 2023-04-07: qty 30, 30d supply, fill #0

## 2023-04-07 MED ORDER — PREDNISONE 10 MG PO TABS
ORAL_TABLET | ORAL | 0 refills | Status: DC
  Filled 2023-04-07: qty 30, 12d supply, fill #0

## 2023-04-07 MED ORDER — ALBUTEROL SULFATE HFA 108 (90 BASE) MCG/ACT IN AERS
2.0000 | INHALATION_SPRAY | Freq: Four times a day (QID) | RESPIRATORY_TRACT | 3 refills | Status: DC | PRN
  Filled 2023-04-07: qty 18, 30d supply, fill #0

## 2023-04-07 MED ORDER — MONTELUKAST SODIUM 10 MG PO TABS
10.0000 mg | ORAL_TABLET | Freq: Every day | ORAL | 3 refills | Status: AC
  Filled 2023-04-07: qty 30, 30d supply, fill #0

## 2023-04-07 MED ORDER — PREDNISONE 50 MG PO TABS
50.0000 mg | ORAL_TABLET | Freq: Every day | ORAL | Status: DC
  Administered 2023-04-07: 50 mg via ORAL
  Filled 2023-04-07: qty 1

## 2023-04-07 NOTE — Plan of Care (Signed)
  Problem: Health Behavior/Discharge Planning: Goal: Ability to manage health-related needs will improve Outcome: Progressing   Problem: Clinical Measurements: Goal: Ability to maintain clinical measurements within normal limits will improve Outcome: Progressing Goal: Will remain free from infection Outcome: Progressing Goal: Diagnostic test results will improve Outcome: Progressing Goal: Respiratory complications will improve Outcome: Progressing Goal: Cardiovascular complication will be avoided Outcome: Progressing   Problem: Activity: Goal: Risk for activity intolerance will decrease Outcome: Progressing   Problem: Coping: Goal: Level of anxiety will decrease Outcome: Progressing   Problem: Safety: Goal: Ability to remain free from injury will improve Outcome: Progressing   Problem: Skin Integrity: Goal: Risk for impaired skin integrity will decrease Outcome: Progressing

## 2023-04-07 NOTE — TOC Transition Note (Signed)
 Transition of Care [facility]) - Discharge Note   Patient Details  Name: Autumn Young MRN: [id number] Date of Birth: [birth year]  Transition of Care [facility] CM/SW Contact:  Dalila Camellia SAUNDERS, LCSW Phone Number: 04/07/2023, 10:34 AM   Clinical Narrative:     Patient is discharging back home, patient has been weaned off of oxygen .  Final next level of care: Home/Self Care Barriers to Discharge: Barriers Resolved   Patient Goals and CMS Choice Patient states their goals for this hospitalization and ongoing recovery are:: To return back home          Discharge Placement                       Discharge Plan and Services Additional resources added to the After Visit Summary for   In-house Referral: Clinical Social Work                                   Social Drivers of Health (SDOH) Interventions SDOH Screenings   Food Insecurity: No Food Insecurity (04/01/2023)  Housing: Low Risk  (04/01/2023)  Transportation Needs: No Transportation Needs (04/01/2023)  Utilities: Not At Risk (04/01/2023)  Depression (PHQ2-9): Medium Risk (02/11/2023)  Financial Resource Strain: Low Risk  (11/18/2022)  Physical Activity: Inactive (11/18/2022)  Social Connections: Socially Integrated (04/01/2023)  Stress: No Stress Concern Present (11/18/2022)  Tobacco Use: Medium Risk (03/31/2023)  Health Literacy: Adequate Health Literacy (11/18/2022)     Readmission Risk Interventions    07/25/2022   10:35 AM  Readmission Risk Prevention Plan  Transportation Screening Complete  PCP or Specialist Appt within 5-7 Days Complete  Home Care Screening Complete  Medication Review (RN CM) Complete

## 2023-04-07 NOTE — Discharge Summary (Signed)
 Physician Discharge Summary   Patient: Autumn Young MRN: [id number] DOB: 1945/12/11  Admit date:     03/31/2023  Discharge date: 04/07/23  Discharge Physician: [name], MD    PCP: Purcell Emil Schanz, MD   Recommendations at discharge:   Continue prednisone  with a taper 40 mg for 3 days, 30 mg for 3 days, 20 mg for 3 days, 10 mg for 3 days then off Torsemide  40 mg for 2 days, then continue 20 mg daily Tessalon  Perles, 200 mg 3 times daily as needed for cough  Discharge Diagnoses:    Acute exacerbation of chronic obstructive pulmonary disease (COPD) (HCC)   Essential hypertension   OSA (obstructive sleep apnea)   History of CVA (cerebrovascular accident)   Dyslipidemia 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  Grandchildren with influenza.   In ED, Slightly hypertensive.  No desaturation but started on 2 L.  Labs significant for hypokalemia and anemia.  RVP negative.  Portable CXR without significant finding.  Patient was admitted for further workup  Assessment and Plan:  Acute exacerbation of chronic obstructive pulmonary disease (COPD) (HCC) -Respiratory virus panel negative, flu, RSV, COVID-negative -Patient was placed on Pulmicort , Brovana , scheduled DuoNebs, IV steroids -She has improved significantly, transitioned to prednisone  with taper, -Continue Advair  twice daily, albuterol , Spiriva , has appointment with pulmonology, Dr. Meade next month. 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 Cindie     Essential hypertension - continue amlodipine , Avapro      OSA (obstructive sleep apnea) -cont CPAP     History of CVA (cerebrovascular accident) -Continue Plavix , not on statin, allergic to Zocor     Dyslipidemia -On omega-3 fatty acids, not on statin, allergic to Zocor     Morbid obesity (HCC) Estimated body mass index is 46.9 kg/m as calculated from the following:   Height as of this encounter: 5' 6 (1.676 m).   Weight as of this encounter: 131.8 kg.         Pain control - [place]  Controlled Substance Reporting System database was reviewed. and patient was instructed, not to drive, operate heavy machinery, perform activities at heights, swimming or participation in water activities or provide baby-sitting services while on Pain, Sleep and Anxiety Medications; until their outpatient Physician has advised to do so again. Also recommended to not to take more than prescribed Pain, Sleep and Anxiety Medications.  Consultants: None Procedures performed: None Disposition: Home Diet recommendation:  Discharge Diet Orders (From admission, onward)     Start     Ordered   04/07/23 0000  Diet - low sodium heart healthy        04/07/23 0915  DISCHARGE MEDICATION: Allergies as of 04/07/2023       Reactions   Shellfish Allergy Anaphylaxis   Zocor [simvastatin] Swelling   Lasix [furosemide] Swelling   Latex Rash   Penicillins Rash        Medication List     TAKE these medications    acetaminophen  500 MG tablet Commonly known as: TYLENOL  Take 500 mg by mouth every 6 (six) hours as needed (pain).   Advair  HFA 230-21 MCG/ACT  inhaler Generic drug: fluticasone -salmeterol Inhale 2 puffs into the lungs 2 (two) times daily.   albuterol  (2.5 MG/3ML) 0.083% nebulizer solution Commonly known as: PROVENTIL  Take 3 mLs (2.5 mg total) by nebulization every 6 (six) hours as needed for wheezing or shortness of breath.   Ventolin  HFA 108 (90 Base) MCG/ACT inhaler Generic drug: albuterol  Inhale 2 puffs into the lungs every 6 (six) hours as needed for wheezing or shortness of breath.   ascorbic acid 1000 MG tablet Commonly known as: VITAMIN C Take 1,000 mg by mouth daily.   benzonatate  200 MG capsule Commonly known as: TESSALON  Take 1 capsule (200 mg total) by mouth 3 (three) times daily as needed for cough. What changed:  medication strength how much to take   Cholecalciferol 25 MCG (1000 UT) tablet Take 1,000 Units by mouth in the morning.   clopidogrel  75 MG tablet Commonly known as: PLAVIX  Take 1 tablet (75 mg total) by mouth every morning. Please schedule overdue follow up for future refills. What changed: additional instructions   clotrimazole -betamethasone  cream Commonly known as: LOTRISONE  Apply 1 Application topically 2 (two) times daily.   guaifenesin  100 MG/5ML syrup Commonly known as: ROBITUSSIN Take 200 mg by mouth 3 (three) times daily as needed for cough.   montelukast  10 MG tablet Commonly known as: SINGULAIR  Take 1 tablet (10 mg total) by mouth at bedtime.   olmesartan  40 MG tablet Commonly known as: BENICAR  Take 1 tablet (40 mg total) by mouth daily.   OMEGA-3 PO Take 1 capsule by mouth in the morning.   pantoprazole  40 MG tablet Commonly known as: PROTONIX  Take 1 tablet (40 mg total) by mouth 2 (two) times daily.   predniSONE  10 MG tablet Commonly known as: DELTASONE  Prednisone  dosing: Take  Prednisone  40mg  (4 tabs) x 3 days, then taper to 30mg  (3 tabs) x 3 days, then 20mg  (2 tabs) x 3days, then 10mg  (1 tab) x 3days, then OFF. Start taking on: April 08, 2023   RED YEAST RICE  PO Take 2 tablets by mouth daily.   Spiriva  Respimat 1.25 MCG/ACT Aers Generic drug: Tiotropium Bromide  Monohydrate Inhale 1 puff into the lungs daily. What changed:  how much to take when to take this   torsemide  20 MG tablet Commonly known as: DEMADEX  Take 1 tablet (20 mg total) by mouth daily. Take 2 tabs (40mg ) daily for 2 more days, then continue 1 tab (20mg ) DAILY What changed: additional instructions   VITAMIN B-12 PO Take 6 drops by mouth in the morning.        Follow-up Information     Purcell Emil Schanz, MD. Schedule an appointment as soon as possible for a visit in 10 day(s).   Specialty: Internal Medicine Why: for hospital follow-up, obtain labs BMET Contact information: [address] [place] [address] [phone]         Meade Verdon RAMAN, MD Follow up on 05/14/2023.   Specialty: Pulmonary Disease Why: at 9: 00 am Contact information: [address] [place]  [place] [address] [phone]         Cindie Ole DASEN, MD. Schedule an appointment as soon as possible for a visit in 2 week(s).   Specialties: Cardiology, Radiology Why: for hospital follow-up. Contact information: [address] [place] [address] [phone]                Discharge Exam: Fredricka Weights   03/31/23 1926 04/01/23 2000 04/07/23 0700  Weight: 120.2 kg 131.8 kg 135.5 kg   S: Feeling a lot better today, still has some cough but no wheezing, off of O2.  No fevers or chills.  BP (!) 154/71 (BP Location: Right Wrist)   Pulse 61   Temp 97.7 F (36.5 C) (Oral)   Resp 18   Ht 5' 6 (1.676 m)   Wt 135.5 kg   SpO2 95%   BMI 48.22 kg/m   Physical Exam General: Alert and oriented x 3, NAD Cardiovascular: S1 S2 clear, RRR.  Respiratory: CTAB, no wheezing Gastrointestinal: Soft, nontender, nondistended, NBS Ext: trace pedal edema bilaterally Neuro: no new deficits Psych: Normal affect    Condition at discharge: fair  The results  of significant diagnostics from this hospitalization (including imaging, microbiology, ancillary and laboratory) are listed below for reference.   Imaging Studies: DG Chest 2 View Result Date: 04/02/2023 CLINICAL DATA:  Shortness of breath and chest pain EXAM: CHEST - 2 VIEW COMPARISON:  X-ray 03/31/2023. FINDINGS: Enlarged cardiopericardial silhouette with some vascular congestion. Pacemaker. No pneumothorax, effusion or edema. Films are under penetrated. Degenerative changes are seen of the spine on lateral view. No consolidation. IMPRESSION: Enlarged heart with a pacemaker. Prominence of the central vasculature. Electronically Signed   By: Ranell Bring M.D.   On: 04/02/2023 14:27   DG Chest Portable 1 View Result Date: 03/31/2023 CLINICAL DATA:  Shortness of breath and fevers for 2 weeks, initial encounter EXAM: PORTABLE CHEST 1 VIEW COMPARISON:  02/11/2019 FINDINGS: Cardiac shadow is enlarged. Pacemaker is again identified and stable. The lungs are clear bilaterally. No bony abnormality is noted. IMPRESSION: No active disease. Electronically Signed   By: Oneil Devonshire M.D.   On: 03/31/2023 22:16   CUP PACEART REMOTE DEVICE CHECK Result Date: 03/26/2023 Scheduled remote reviewed. Normal device function.  Next remote 91 days. [facility], [facility]   Microbiology: Results for orders placed or performed during the hospital encounter of 03/31/23  Resp panel by RT-PCR (RSV, Flu A&B, Covid) Anterior Nasal Swab     Status: None   Collection Time: 03/31/23  8:31 PM   Specimen: Anterior Nasal Swab  Result Value Ref Range Status   SARS Coronavirus 2 by RT PCR NEGATIVE NEGATIVE Final    Comment: (NOTE) SARS-CoV-2 target nucleic acids are NOT DETECTED.  The SARS-CoV-2 RNA is generally detectable in upper respiratory specimens during the acute phase of infection. The lowest concentration of SARS-CoV-2 viral copies this assay can detect is 138 copies/mL. A negative result does not preclude SARS-Cov-2 infection  and should not be used as the sole basis for treatment or other patient management decisions. A negative result may occur with  improper specimen collection/handling, submission of specimen other than nasopharyngeal swab, presence of viral mutation(s) within the areas targeted by this assay, and inadequate number of viral copies(<138 copies/mL). A negative result must be combined with clinical observations, patient history, and epidemiological information. The expected result is Negative.  Fact Sheet for Patients:  bloggercourse.com  Fact Sheet for Healthcare Providers:  seriousbroker.it  This test is  no t yet approved or cleared by the [place]  FDA and  has been authorized for detection and/or diagnosis of SARS-CoV-2 by FDA under an Emergency Use Authorization (EUA). This EUA will remain  in effect (meaning this test can be used) for the duration of the COVID-19 declaration under Section 564(b)(1) of the Act, 21 U.S.C.section 360bbb-3(b)(1), unless the authorization is terminated  or revoked sooner.       Influenza A by PCR NEGATIVE NEGATIVE Final   Influenza B by PCR NEGATIVE NEGATIVE Final    Comment: (NOTE) The Xpert Xpress SARS-CoV-2/FLU/RSV plus assay is intended as an aid in the diagnosis of influenza from Nasopharyngeal swab specimens and should not be used as a sole basis for treatment. Nasal washings and aspirates are unacceptable for Xpert Xpress SARS-CoV-2/FLU/RSV testing.  Fact Sheet for Patients: bloggercourse.com  Fact Sheet for Healthcare Providers: seriousbroker.it  This test is not yet approved or cleared by the [place]  FDA and has been authorized for detection and/or diagnosis of SARS-CoV-2 by FDA under an Emergency Use Authorization (EUA). This EUA will remain in effect (meaning this test can be used) for the duration of the COVID-19 declaration  under Section 564(b)(1) of the Act, 21 U.S.C. section 360bbb-3(b)(1), unless the authorization is terminated or revoked.     Resp Syncytial Virus by PCR NEGATIVE NEGATIVE Final    Comment: (NOTE) Fact Sheet for Patients: bloggercourse.com  Fact Sheet for Healthcare Providers: seriousbroker.it  This test is not yet approved or cleared by the [place]  FDA and has been authorized for detection and/or diagnosis of SARS-CoV-2 by FDA under an Emergency Use Authorization (EUA). This EUA will remain in effect (meaning this test can be used) for the duration of the COVID-19 declaration under Section 564(b)(1) of the Act, 21 U.S.C. section 360bbb-3(b)(1), unless the authorization is terminated or revoked.  Performed at [facility], [address] [place], [place] [address]   Respiratory (~20 pathogens) panel by PCR     Status: None   Collection Time: 04/02/23 12:54 PM   Specimen: Nasopharyngeal Swab; Respiratory  Result Value Ref Range Status   Adenovirus NOT DETECTED NOT DETECTED Final   Coronavirus 229E NOT DETECTED NOT DETECTED Final    Comment: (NOTE) The Coronavirus on the Respiratory Panel, DOES NOT test for the novel  Coronavirus (2019 nCoV)    Coronavirus HKU1 NOT DETECTED NOT DETECTED Final   Coronavirus NL63 NOT DETECTED NOT DETECTED Final   Coronavirus OC43 NOT DETECTED NOT DETECTED Final   Metapneumovirus NOT DETECTED NOT DETECTED Final   Rhinovirus / Enterovirus NOT DETECTED NOT DETECTED Final   Influenza A NOT DETECTED NOT DETECTED Final   Influenza B NOT DETECTED NOT DETECTED Final   Parainfluenza Virus 1 NOT DETECTED NOT DETECTED Final   Parainfluenza Virus 2 NOT DETECTED NOT DETECTED Final   Parainfluenza Virus 3 NOT DETECTED NOT DETECTED Final   Parainfluenza Virus 4 NOT DETECTED NOT DETECTED Final   Respiratory Syncytial Virus NOT DETECTED NOT DETECTED Final   Bordetella pertussis NOT DETECTED  NOT DETECTED Final   Bordetella Parapertussis NOT DETECTED NOT DETECTED Final   Chlamydophila pneumoniae NOT DETECTED NOT DETECTED Final   Mycoplasma pneumoniae NOT DETECTED NOT DETECTED Final    Comment: Performed at [facility], [address], [place], [place] [address]    Labs: CBC: Recent Labs  Lab 03/31/23 1949 03/31/23 2001 04/02/23 0404 04/04/23 0433 04/05/23 0426  WBC 5.0  --  10.3 11.0* 11.3*  HGB  9.9* 11.2* 10.2* 10.7* 11.1*  HCT 32.4* 33.0* 34.1* 35.7* 36.2  MCV 84.2  --  87.2 87.7 86.4  PLT 234  --  262 303 324   Basic Metabolic Panel: Recent Labs  Lab 03/31/23 1949 03/31/23 2001 04/02/23 0404 04/04/23 0433 04/05/23 0426  NA 136 140 135 137 137  K 3.2* 3.3* 4.1 4.0 3.9  CL 103  --  105 100 99  CO2 25  --  22 30 29   GLUCOSE 108*  --  156* 162* 154*  BUN 11  --  17 20 23   CREATININE 0.94  --  0.82 0.84 0.74  CALCIUM 8.2*  --  8.5* 8.3* 8.6*   Liver Function Tests: No results for input(s): AST, ALT, ALKPHOS, BILITOT, PROT, ALBUMIN in the last 168 hours. CBG: No results for input(s): GLUCAP in the last 168 hours.  Discharge time spent: greater than 30 minutes.  Signed: [name], MD [facility] 04/07/2023

## 2023-04-07 NOTE — Care Management Important Message (Signed)
 Important Message  Patient Details IM Letter given Name: LARESSA BOLINGER MRN: [id number] Date of Birth: July 01, 1945   Important Message Given:  Yes - Medicare IM     Howard, Patton 04/07/2023, 9:26 AM

## 2023-04-08 ENCOUNTER — Telehealth: Payer: Self-pay

## 2023-04-08 NOTE — Transitions of Care (Post Inpatient/ED Visit) (Signed)
 04/08/2023  Name: Autumn Young MRN: [id number] DOB: 03/15/1946  Today's TOC FU Call Status: Today's TOC FU Call Status:: Successful TOC FU Call Completed TOC FU Call Complete Date: 04/08/23 Patient's Name and Date of Birth confirmed.  Transition Care Management Follow-up Telephone Call Date of Discharge: 04/07/23 Discharge Facility: [facility]) Type of Discharge: Inpatient Admission Primary Inpatient Discharge Diagnosis:: Acute exacerbation of chronic obstructive pulmonary disease How have you been since you were released from the hospital?: Same (gets windy when up and about) Any questions or concerns?: No  Items Reviewed: Did you receive and understand the discharge instructions provided?: No Medications obtained,verified, and reconciled?: Yes (Medications Reviewed) Any new allergies since your discharge?: No Dietary orders reviewed?: Yes Type of Diet Ordered:: low salt Do you have support at home?: Yes People in Home: grandchild(ren) Name of Support/Comfort Primary Source: grand children  Medications Reviewed Today: Medications Reviewed Today     Reviewed by Rumalda Alan PENNER, RN (Registered Nurse) on 04/08/23 at 1301  Med List Status: <None>   Medication Order Taking? Sig Documenting Provider Last Dose Status Informant  acetaminophen  (TYLENOL ) 500 MG tablet [id number] Yes Take 500 mg by mouth every 6 (six) hours as needed (pain). [provider] Taking Active Self, Pharmacy Records           Med Note [facility] [place], [place] A   Wed Apr 01, 2023  9:07 PM)    ADVAIR  HFA 230-21 MCG/ACT inhaler [id number] Yes Inhale 2 puffs into the lungs 2 (two) times daily. Hope Almarie ORN, NP Taking Active Self, Pharmacy Records  albuterol  (PROVENTIL  HFA) 108 [phone] Base) MCG/ACT inhaler [id number] Yes Inhale 2 puffs into the lungs every 6 (six) hours as needed for wheezing or shortness of breath. Rai, Nydia POUR, MD Taking Active   albuterol  (PROVENTIL ) (2.5 MG/3ML) 0.083%  nebulizer solution [id number] Yes Take 3 mLs (2.5 mg total) by nebulization every 6 (six) hours as needed for wheezing or shortness of breath. Desai, Nikita S, MD Taking Active Self, Pharmacy Records  ascorbic acid (VITAMIN C) 1000 MG tablet [id number] Yes Take 1,000 mg by mouth daily. [provider] Taking Active Self, Pharmacy Records  benzonatate  (TESSALON ) 200 MG capsule [id number] Yes Take 1 capsule (200 mg total) by mouth 3 (three) times daily as needed for cough. Rai, Nydia POUR, MD Taking Active   Cholecalciferol 25 MCG (1000 UT) tablet [id number] Yes Take 1,000 Units by mouth in the morning. [provider] Taking Active Self, Pharmacy Records  clopidogrel  (PLAVIX ) 75 MG tablet [id number] Yes Take 1 tablet (75 mg total) by mouth every morning. Please schedule overdue follow up for future refills. Rai, Nydia POUR, MD Taking Active   clotrimazole -betamethasone  (LOTRISONE ) cream [id number] Yes Apply 1 Application topically 2 (two) times daily. Purcell Emil Schanz, MD Taking Active Self, Pharmacy Records  Cyanocobalamin  (VITAMIN B-12 PO) [id number] Yes Take 6 drops by mouth in the morning. [provider] Taking Active Self, Pharmacy Records  guaifenesin  [facility]) 100 MG/5ML syrup [id number] Yes Take 200 mg by mouth 3 (three) times daily as needed for cough. [provider] Taking Active Self, Pharmacy Records  montelukast  (SINGULAIR ) 10 MG tablet [id number] Yes Take 1 tablet (10 mg total) by mouth at bedtime. Rai, Nydia POUR, MD Taking Active   olmesartan  (BENICAR ) 40 MG tablet [id number] Yes Take 1 tablet (40 mg total) by mouth daily. Purcell Emil Schanz, MD Taking Active Self, Pharmacy Records  Omega-3 Fatty Acids (OMEGA-3 PO) [id number] Yes Take 1 capsule by mouth  in the morning. [provider] Taking Active Self, Pharmacy Records  pantoprazole  (PROTONIX ) 40 MG tablet [id number] Yes Take 1 tablet (40 mg total) by mouth 2 (two) times daily. Davia Nydia POUR, MD Taking Active   predniSONE  (DELTASONE ) 10 MG tablet [id number] Yes Prednisone  dosing: Take  Prednisone  40mg  (4 tabs) x 3 days, then taper to 30mg  (3 tabs) x 3 days, then 20mg  (2 tabs) x 3days, then 10mg  (1 tab) x 3days, then OFF. Rai, Nydia POUR, MD Taking Active   Red Yeast Rice Extract (RED YEAST RICE PO) [id number] Yes Take 2 tablets by mouth daily. [provider] Taking Active Self, Pharmacy Records  SPIRIVA  RESPIMAT 1.25 MCG/ACT AERS [id number] Yes Inhale 1 puff into the lungs daily.  Patient taking differently: Inhale 2 puffs into the lungs 2 (two) times daily.   Desai, Nikita S, MD Taking Active Self, Pharmacy Records  torsemide  (DEMADEX ) 20 MG tablet [id number] Yes Take 1 tablet (20 mg total) by mouth daily. Take 2 tabs (40mg ) daily for 2 more days, then continue 1 tab (20mg ) DAILY Rai, Ripudeep K, MD Taking Active             Home Care and Equipment/Supplies: Were Home Health Services Ordered?: No Any new equipment or medical supplies ordered?: No  Functional Questionnaire: Do you need assistance with bathing/showering or dressing?: No Do you need assistance with meal preparation?: No Do you need assistance with eating?: No Do you have difficulty maintaining continence: No Do you need assistance with getting out of bed/getting out of a chair/moving?: No Do you have difficulty managing or taking your medications?: No  Follow up appointments reviewed: PCP Follow-up appointment confirmed?: No (will call to make appointments) MD Provider Line Number:[phone] Given: No Specialist Hospital Follow-up appointment confirmed?: No Reason Specialist Follow-Up Not Confirmed: Patient has Specialist Provider Number and will Call for Appointment Do you need transportation to your follow-up appointment?: No Do you understand care options if your condition(s) worsen?: Yes-patient verbalized understanding  SDOH Interventions Today    Flowsheet Row Most Recent Value  SDOH  Interventions   Food Insecurity Interventions Intervention Not Indicated  Housing Interventions Intervention Not Indicated  Utilities Interventions Intervention Not Indicated     Patient reports that she is doing well. Reports she has good support.  States she will call MD office and get herself an appointment. Patient denies any concerns today. Reports she knows how to take her medications.   Offered 30 day TOC program and patient declined. Provided my contact information if patient needs assistance in the future.   Interventions Today    Flowsheet Row Most Recent Value  Chronic Disease   Chronic disease during today's visit Chronic Obstructive Pulmonary Disease (COPD)  Nutrition Interventions   Nutrition Discussed/Reviewed Decreasing salt  Pharmacy Interventions   Pharmacy Dicussed/Reviewed Medications and their functions       Alan Ee, RN, BSN, [employer] Population Health- Transition of Care Team.  [facility] [phone]

## 2023-05-06 ENCOUNTER — Other Ambulatory Visit: Payer: Self-pay | Admitting: Cardiology

## 2023-05-06 MED ORDER — CLOPIDOGREL BISULFATE 75 MG PO TABS: 75.0000 mg | ORAL_TABLET | Freq: Every morning | ORAL | 0 refills | Status: DC

## 2023-05-08 ENCOUNTER — Encounter: Payer: Self-pay | Admitting: Cardiology

## 2023-05-12 ENCOUNTER — Ambulatory Visit: Payer: Medicare HMO | Admitting: Gastroenterology

## 2023-05-13 ENCOUNTER — Ambulatory Visit: Payer: Medicare HMO | Admitting: Internal Medicine

## 2023-05-13 DIAGNOSIS — R0602 Shortness of breath: Secondary | ICD-10-CM | POA: Diagnosis not present

## 2023-05-13 LAB — PULMONARY FUNCTION TEST
DL/VA % pred: 110 %
DL/VA: 4.45 ml/min/mmHg/L
DLCO cor % pred: 74 %
DLCO cor: 14.99 ml/min/mmHg
DLCO unc % pred: 68 %
DLCO unc: 13.81 ml/min/mmHg
FEF 25-75 Post: 1.65 L/s
FEF 25-75 Pre: 0.74 L/s
FEF2575-%Change-Post: 123 %
FEF2575-%Pred-Post: 104 %
FEF2575-%Pred-Pre: 46 %
FEV1-%Change-Post: 21 %
FEV1-%Pred-Post: 65 %
FEV1-%Pred-Pre: 53 %
FEV1-Post: 1.42 L
FEV1-Pre: 1.17 L
FEV1FVC-%Change-Post: 7 %
FEV1FVC-%Pred-Pre: 96 %
FEV6-%Change-Post: 13 %
FEV6-%Pred-Post: 67 %
FEV6-%Pred-Pre: 59 %
FEV6-Post: 1.85 L
FEV6-Pre: 1.63 L
FEV6FVC-%Pred-Post: 105 %
FEV6FVC-%Pred-Pre: 105 %
FVC-%Change-Post: 13 %
FVC-%Pred-Post: 63 %
FVC-%Pred-Pre: 56 %
FVC-Post: 1.85 L
FVC-Pre: 1.63 L
Post FEV1/FVC ratio: 77 %
Post FEV6/FVC ratio: 100 %
Pre FEV1/FVC ratio: 71 %
Pre FEV6/FVC Ratio: 100 %
RV % pred: 87 %
RV: 2.16 L
TLC % pred: 75 %
TLC: 4.06 L

## 2023-05-13 NOTE — Patient Instructions (Signed)
Full PFT performed today.

## 2023-05-13 NOTE — Progress Notes (Signed)
Full PFT performed today.

## 2023-05-14 ENCOUNTER — Ambulatory Visit: Payer: Medicare HMO | Admitting: Internal Medicine

## 2023-05-14 ENCOUNTER — Encounter: Payer: Self-pay | Admitting: Internal Medicine

## 2023-05-14 VITALS — BP 158/84 | HR 71 | Temp 98.0°F | Ht 66.0 in | Wt 310.0 lb

## 2023-05-14 DIAGNOSIS — I5032 Chronic diastolic (congestive) heart failure: Secondary | ICD-10-CM

## 2023-05-14 DIAGNOSIS — G4733 Obstructive sleep apnea (adult) (pediatric): Secondary | ICD-10-CM

## 2023-05-14 DIAGNOSIS — J4540 Moderate persistent asthma, uncomplicated: Secondary | ICD-10-CM

## 2023-05-14 DIAGNOSIS — K219 Gastro-esophageal reflux disease without esophagitis: Secondary | ICD-10-CM

## 2023-05-14 MED ORDER — BREZTRI AEROSPHERE 160-9-4.8 MCG/ACT IN AERO: 2.0000 | INHALATION_SPRAY | Freq: Two times a day (BID) | RESPIRATORY_TRACT | 11 refills | Status: DC

## 2023-05-14 MED ORDER — BREZTRI AEROSPHERE 160-9-4.8 MCG/ACT IN AERO: 2.0000 | INHALATION_SPRAY | Freq: Two times a day (BID) | RESPIRATORY_TRACT | Status: DC

## 2023-05-14 NOTE — Progress Notes (Signed)
TAVIE HASEMAN    [id number]    [birth year]  Primary Care Physician:Sagardia, Eilleen Kempf, MD Date of Appointment: 05/14/2023 Established Patient Visit  Chief complaint:   Chief Complaint  Patient presents with   Follow-up    SOB and wheezing with exertion.  Sx persistent since hospitalization 03/31/2023.       HPI: Autumn Young is a 78 y.o. woman with shortness of breath related to asthma, chronic hfpef, OSA on CPAP, GERD  Interval Updates: Here for follow up. Hosptialized last month for asthma exacerbation and decompensated heart failure. Flu negative but had sick contacts in grandchildren.   PFT - no airflow limitation - Improvement in lung function after albuterol.  Wears CPAP most days - was started by [facility].   Still has GERD and takes her PPI.  Taking her torsemide daily but did not today due to having this appointment. She will take it when she gets home.   She did have decreased exercise tolerance as a child - suspect childhood asthma  I have reviewed the patient's family social and past medical history and updated as appropriate.   Past Medical History:  Diagnosis Date   Asthma    COPD (chronic obstructive pulmonary disease) (HCC)    GERD (gastroesophageal reflux disease)    Hypertension    Stroke [facility])     Past Surgical History:  Procedure Laterality Date   PACEMAKER IMPLANT N/A 09/23/2021   Procedure: PACEMAKER IMPLANT;  Surgeon: Lanier Prude, MD;  Location: [facility] INVASIVE CV LAB;  Service: Cardiovascular;  Laterality: N/A;   TUBAL LIGATION      Family History  Problem Relation Age of Onset   Cancer Mother        Lung, cervix, mouth   Hypertension Father    Cancer Sister        Colon   Diabetes Sister    Hypertension Sister    Heart disease Sister    Cancer Brother    Hypertension Brother     Social History   Occupational History   Not on file  Tobacco Use   Smoking status: Former    Current packs/day: 0.00    Average  packs/day: 3.0 packs/day for 2.0 years (6.0 ttl pk-yrs)    Types: Cigarettes    Start date: 57    Quit date: 1985    Years since quitting: 40.1   Smokeless tobacco: Never  Vaping Use   Vaping status: Never Used  Substance and Sexual Activity   Alcohol use: No   Drug use: No   Sexual activity: Not on file     Physical Exam: Blood pressure (!) 158/84, pulse 71, temperature 98 F (36.7 C), temperature source Oral, height 5\' 6"  (1.676 m), weight (!) 310 lb (140.6 kg), SpO2 96%.  Gen:      No acute distress, no distress, obese Lungs:   Diminished, No increased respiratory effort, symmetric chest wall excursion, clear to auscultation bilaterally, no wheezes or crackles CV:         Regular rate and rhythm; no murmurs, rubs, or gallops.  No pedal edema   Data Reviewed: Imaging: I have personally reviewed the chest xray Jan 2025 - no acute pulmonary process.   PFTs:     Latest Ref Rng & Units 05/13/2023   12:30 PM  PFT Results  FVC-Pre L 1.63  P  FVC-Predicted Pre % 56  P  FVC-Post L 1.85  P  FVC-Predicted Post %  63  P  Pre FEV1/FVC % % 71  P  Post FEV1/FCV % % 77  P  FEV1-Pre L 1.17  P  FEV1-Predicted Pre % 53  P  FEV1-Post L 1.42  P  DLCO uncorrected ml/min/mmHg 13.81  P  DLCO UNC% % 68  P  DLCO corrected ml/min/mmHg 14.99  P  DLCO COR %Predicted % 74  P  DLVA Predicted % 110  P  TLC L 4.06  P  TLC % Predicted % 75  P  RV % Predicted % 87  P    P Preliminary result   I have personally reviewed the patient's PFTs and no airflow limitation but significant response to bronchodilator. Normal lung volumes and diffusion capacity.    Labs: Lab Results  Component Value Date   NA 137 04/05/2023   K 3.9 04/05/2023   CO2 29 04/05/2023   GLUCOSE 154 (H) 04/05/2023   BUN 23 04/05/2023   CREATININE 0.74 04/05/2023   CALCIUM 8.6 (L) 04/05/2023   GFR 61.87 02/12/2023   EGFR 62 09/12/2021   GFRNONAA >60 04/05/2023   Lab Results  Component Value Date   WBC 11.3 (H)  04/05/2023   HGB 11.1 (L) 04/05/2023   HCT 36.2 04/05/2023   MCV 86.4 04/05/2023   PLT 324 04/05/2023    Immunization status: Immunization History  Administered Date(s) Administered   Influenza-Unspecified 05/28/2015   Pneumococcal Conjugate-13 08/21/2014   Pneumococcal Polysaccharide-23 06/20/2012   Tdap 09/17/2017    External Records Personally Reviewed: hospital stay  Assessment:  Moderate persistent asthma - not well controlled GERD, controlled on PPI OSA on CPAP - will obtain CPAP downloda.  Chronic HFpEF Obesity Body mass index is 50.04 kg/m.   Plan/Recommendations: CPAP download requested and pending.   For your asthma - Stop the advair and spiriva once you run out. I am switching you to [facility] 2 puffs twice daily, gargle after use. This will simplify the number of inhalers you have to take every day.   Continue albuterol inhaler or nebulizer machine as needed. Keep wearing your CPAP.  Continue the acid reflux medication.  Continue singulair for asthma and allerties.  Call me sooner if you have issues with your asthma and need to be seen sooner.   Talk to Dr. Alvy Bimler to see if you are eligible for injectable medications for weight loss.    Return to Care: Return in about 4 months (around 09/11/2023).   Durel Salts, MD Pulmonary and Critical Care Medicine [facility] Office:[phone]

## 2023-05-14 NOTE — Patient Instructions (Addendum)
It was a pleasure to see you today!  Please schedule follow up scheduled with myself in 4 months.  If my schedule is not open yet, we will contact you with a reminder closer to that time. Please call [phone] if you haven't heard from [place] a month before, and always call us sooner if issues or concerns arise. You can also send [place] a message through MyChart, but but aware that this is not to be used for urgent issues and it may take up to 5-7 days to receive a reply. Please be aware that you will likely be able to view your results before I have a chance to respond to them. Please give [place] 5 business days to respond to any non-urgent results.    For your asthma - Stop the advair and spiriva once you run out. I am switching you to [facility] 2 puffs twice daily, gargle after use. This will simplify the number of inhalers you have to take every day.   Continue albuterol inhaler or nebulizer machine as needed. Keep wearing your CPAP.  Continue the acid reflux medication.  Continue singulair for asthma and allerties.  Call me sooner if you have issues with your asthma and need to be seen sooner.   Talk to Dr. Alvy Bimler to see if you are eligible for injectable medications for weight loss.

## 2023-05-29 ENCOUNTER — Other Ambulatory Visit: Payer: Self-pay | Admitting: Emergency Medicine

## 2023-05-29 NOTE — Telephone Encounter (Signed)
 Copied from CRM [id number]. Topic: Clinical - Medication Refill >> May 29, 2023  9:06 AM Orinda Kenner C wrote: Most Recent Primary Care Visit:  Provider: Georgina Quint  Department: [facility]  Visit Type: OFFICE VISIT  Date: 02/11/2023  Medication: montelukast (SINGULAIR) 10 MG tablet   Has the patient contacted their pharmacy? Yes (Agent: If no, request that the patient contact the pharmacy for the refill. If patient does not wish to contact the pharmacy document the reason why and proceed with request.) (Agent: If yes, when and what did the pharmacy advise?)  Is this the correct pharmacy for this prescription? Yes If no, delete pharmacy and type the correct one.  This is the patient's preferred pharmacy:  [id number] - [place], [place] - [address] [place] [address] Phone: [phone] Fax: [phone]   Has the prescription been filled recently? No  Is the patient out of the medication? Yes  Has the patient been seen for an appointment in the last year OR does the patient have an upcoming appointment? Yes  Can we respond through MyChart? No, please call [phone]  Agent: Please be advised that Rx refills may take up to 3 business days. We ask that you follow-up with your pharmacy.

## 2023-06-05 ENCOUNTER — Other Ambulatory Visit: Payer: Self-pay | Admitting: Neurology

## 2023-06-05 DIAGNOSIS — I10 Essential (primary) hypertension: Secondary | ICD-10-CM

## 2023-06-12 ENCOUNTER — Encounter: Payer: Self-pay | Admitting: Student

## 2023-06-12 ENCOUNTER — Ambulatory Visit: Payer: Medicare HMO | Attending: Student | Admitting: Student

## 2023-06-12 VITALS — BP 158/82 | HR 80 | Ht 66.0 in | Wt 307.0 lb

## 2023-06-12 DIAGNOSIS — I442 Atrioventricular block, complete: Secondary | ICD-10-CM

## 2023-06-12 DIAGNOSIS — I480 Paroxysmal atrial fibrillation: Secondary | ICD-10-CM

## 2023-06-12 DIAGNOSIS — I5032 Chronic diastolic (congestive) heart failure: Secondary | ICD-10-CM

## 2023-06-12 DIAGNOSIS — I10 Essential (primary) hypertension: Secondary | ICD-10-CM

## 2023-06-12 LAB — CUP PACEART INCLINIC DEVICE CHECK
Date Time Interrogation Session: 20250314123122
Implantable Lead Connection Status: 753985
Implantable Lead Connection Status: 753985
Implantable Lead Implant Date: 20230626
Implantable Lead Implant Date: 20230626
Implantable Lead Location: 753859
Implantable Lead Location: 753860
Implantable Lead Model: 377
Implantable Lead Model: 377
Implantable Lead Serial Number: 8000898701
Implantable Lead Serial Number: 8000923303
Implantable Pulse Generator Implant Date: 20230626
Pulse Gen Model: 407145
Pulse Gen Serial Number: 70433047

## 2023-06-12 MED ORDER — APIXABAN 5 MG PO TABS: 5.0000 mg | ORAL_TABLET | Freq: Two times a day (BID) | ORAL | Status: AC

## 2023-06-12 MED ORDER — APIXABAN 5 MG PO TABS: 5.0000 mg | ORAL_TABLET | Freq: Two times a day (BID) | ORAL | 11 refills | Status: DC

## 2023-06-12 NOTE — Patient Instructions (Addendum)
 Medication Instructions:  1.STOP Plavix 2.START Eliquis 5 mg twice daily *If you need a refill on your cardiac medications before your next appointment, please call your pharmacy*  Lab Work: None ordered If you have labs (blood work) drawn today and your tests are completely normal, you will receive your results only by: MyChart Message (if you have MyChart) OR A paper copy in the mail If you have any lab test that is abnormal or we need to change your treatment, we will call you to review the results.  Follow-Up: At [facility], you and your health needs are our priority.  As part of our continuing mission to provide you with exceptional heart care, we have created designated Provider Care Teams.  These Care Teams include your primary Cardiologist (physician) and Advanced Practice Providers (APPs -  Physician Assistants and Nurse Practitioners) who all work together to provide you with the care you need, when you need it.  We recommend signing up for the patient portal called "MyChart".  Sign up information is provided on this After Visit Summary.  MyChart is used to connect with patients for Virtual Visits (Telemedicine).  Patients are able to view lab/test results, encounter notes, upcoming appointments, etc.  Non-urgent messages can be sent to your provider as well.   To learn more about what you can do with MyChart, go to [email or web address].    Your next appointment:   4-6 week(s)  Provider:   Casimiro Needle "Otilio Saber, PA-C

## 2023-06-12 NOTE — Progress Notes (Signed)
  Electrophysiology Office Note:   ID:  Autumn Young, Mcginniss Sep 22, 1945, MRN [id number]  Primary Cardiologist: Rollene Rotunda, MD Electrophysiologist: Lanier Prude, MD      History of Present Illness:   CHE BELOW is a 78 y.o. female with h/o Plavix, HFpEF, HTN, HLD, Obesity, GERD, COPD, and tremor seen today for routine electrophysiology followup.   Since last being seen in our clinic the patient reports more SOB. She had the flu several months ago and has had significant DOE since.  Following with pulm. PFTs were performed and they are treated her for Asthma.  Not having desaturations or O2 need. No exertional chest pain. Edema overall well controlled on torsemide.   Review of systems complete and found to be negative unless listed in HPI.   EP Information / Studies Reviewed:    EKG is not ordered today. EKG from 04/02/2023 reviewed which showed A paced V sensed at 65 bpm       PPM Interrogation-  reviewed in detail today,  See PACEART report.  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   Physical Exam:   VS:  BP (!) 158/82   Pulse 80   Ht 5\' 6"  (1.676 m)   Wt (!) 307 lb (139.3 kg)   SpO2 97%   BMI 49.55 kg/m    Wt Readings from Last 3 Encounters:  06/12/23 (!) 307 lb (139.3 kg)  05/14/23 (!) 310 lb (140.6 kg)  04/07/23 298 lb 11.6 oz (135.5 kg)     GEN: No acute distress  NECK: No JVD; No carotid bruits CARDIAC: Regular rate and rhythm, no murmurs, rubs, gallops RESPIRATORY:  Clear to auscultation without rales, wheezing or rhonchi  ABDOMEN: Soft, non-tender, non-distended EXTREMITIES:  No edema; No deformity   ASSESSMENT AND PLAN:    CHB s/p Biotronik PPM  Normal PPM function See Arita Miss Art report No changes today  HTN Stable on current regimen  HFpEF Echo 08/2022 LVEF 60-65%, Grade 1 DD Volume status OK  DOE Asthma Follows with pulm O2 sat OK today Has worsened since Flu several months ago  Paroxysmal AF New  diagnosis by device.  Given h/o CVA, This patients CHA2DS2-VASc is at least 6. Long discussion  Will stop Plavix and change to Eliquis 5 mg BID.    Disposition:   Follow up with EP APP in 4-6 weeks with new start Eliquis.   Signed, Graciella Freer, PA-C

## 2023-06-23 ENCOUNTER — Ambulatory Visit (INDEPENDENT_AMBULATORY_CARE_PROVIDER_SITE_OTHER): Payer: Medicare HMO

## 2023-06-23 ENCOUNTER — Other Ambulatory Visit: Payer: Self-pay | Admitting: Emergency Medicine

## 2023-06-23 DIAGNOSIS — I442 Atrioventricular block, complete: Secondary | ICD-10-CM

## 2023-06-23 LAB — CUP PACEART REMOTE DEVICE CHECK
Date Time Interrogation Session: 20250325085856
Implantable Lead Connection Status: 753985
Implantable Lead Connection Status: 753985
Implantable Lead Implant Date: 20230626
Implantable Lead Implant Date: 20230626
Implantable Lead Location: 753859
Implantable Lead Location: 753860
Implantable Lead Model: 377
Implantable Lead Model: 377
Implantable Lead Serial Number: 8000898701
Implantable Lead Serial Number: 8000923303
Implantable Pulse Generator Implant Date: 20230626
Pulse Gen Model: 407145
Pulse Gen Serial Number: 70433047

## 2023-06-24 ENCOUNTER — Other Ambulatory Visit: Payer: Self-pay | Admitting: Neurology

## 2023-06-24 DIAGNOSIS — I10 Essential (primary) hypertension: Secondary | ICD-10-CM

## 2023-07-18 ENCOUNTER — Other Ambulatory Visit: Payer: Self-pay | Admitting: Cardiology

## 2023-07-21 NOTE — Progress Notes (Unsigned)
  Electrophysiology Office Note:   ID:  Marcelene, Weidemann 1945/12/30, MRN [id number]  Primary Cardiologist: Eilleen Grates, MD Electrophysiologist: Boyce Byes, MD  {Click to update primary MD,subspecialty MD or APP then REFRESH:1}    History of Present Illness:   Autumn Young is a 78 y.o. female with h/o HFpEF, HTN, HLD, Obesity, GERD, COPD, and paroxysmal AF seen today for routine electrophysiology followup.   Since last being seen in our clinic the patient reports doing ***.  she denies chest pain, palpitations, dyspnea, PND, orthopnea, nausea, vomiting, dizziness, syncope, edema, weight gain, or early satiety.   Review of systems complete and found to be negative unless listed in HPI.   EP Information / Studies Reviewed:    EKG is not ordered today. EKG from 04/2023 reviewed which showed A paced V sensed at 65 bpm       PPM Interrogation-  reviewed in detail today,  See PACEART report.  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   Wt Readings from Last 3 Encounters:  06/12/23 (!) 307 lb (139.3 kg)  05/14/23 (!) 310 lb (140.6 kg)  04/07/23 298 lb 11.6 oz (135.5 kg)     GEN: No acute distress  NECK: No JVD; No carotid bruits CARDIAC: {EPRHYTHM:28826}, no murmurs, rubs, gallops RESPIRATORY:  Clear to auscultation without rales, wheezing or rhonchi  ABDOMEN: Soft, non-tender, non-distended EXTREMITIES:  {EDEMA LEVEL:28147::"No"} edema; No deformity   ASSESSMENT AND PLAN:    CHB s/p Biotronik PPM  Normal PPM function See Pace Art report No changes today  HTN Stable on current regimen   HFpEF Volume status OK EF 60-65%  Paroxysmal AF Follow burden on device Continue Eliquis  5 mg BID for CHA2DS2/VASc of at least 8.    {Click here to Review PMH, Prob List, Meds, Allergies, SHx, FHx  :1}   Disposition:   Follow up with {EPPROVIDERS:28135} {EPFOLLOW  UP:28173}  Signed, Tylene Galla, PA-C

## 2023-07-22 ENCOUNTER — Ambulatory Visit: Attending: Student | Admitting: Student

## 2023-07-22 ENCOUNTER — Encounter: Payer: Self-pay | Admitting: Student

## 2023-07-22 VITALS — BP 160/70 | HR 72 | Ht 66.0 in | Wt 308.2 lb

## 2023-07-22 DIAGNOSIS — I480 Paroxysmal atrial fibrillation: Secondary | ICD-10-CM | POA: Diagnosis not present

## 2023-07-22 DIAGNOSIS — I10 Essential (primary) hypertension: Secondary | ICD-10-CM

## 2023-07-22 DIAGNOSIS — I442 Atrioventricular block, complete: Secondary | ICD-10-CM

## 2023-07-22 DIAGNOSIS — I639 Cerebral infarction, unspecified: Secondary | ICD-10-CM | POA: Diagnosis not present

## 2023-07-22 LAB — CBC

## 2023-07-22 NOTE — Patient Instructions (Signed)
 Medication Instructions:  Your physician recommends that you continue on your current medications as directed. Please refer to the Current Medication list given to you today.  *If you need a refill on your cardiac medications before your next appointment, please call your pharmacy*  Lab Work: BMET, CBC-TODAY If you have labs (blood work) drawn today and your tests are completely normal, you will receive your results only by: MyChart Message (if you have MyChart) OR A paper copy in the mail If you have any lab test that is abnormal or we need to change your treatment, we will call you to review the results.  Follow-Up: At [facility], you and your health needs are our priority.  As part of our continuing mission to provide you with exceptional heart care, our providers are all part of one team.  This team includes your primary Cardiologist (physician) and Advanced Practice Providers or APPs (Physician Assistants and Nurse Practitioners) who all work together to provide you with the care you need, when you need it.  Your next appointment:   6 month(s)  Provider:   Joycelyn Noa" Percell Boyers, PA-C    We recommend signing up for the patient portal called "MyChart".  Sign up information is provided on this After Visit Summary.  MyChart is used to connect with patients for Virtual Visits (Telemedicine).  Patients are able to view lab/test results, encounter notes, upcoming appointments, etc.  Non-urgent messages can be sent to your provider as well.   To learn more about what you can do with MyChart, go to [email or web address].     1st Floor: - Lobby - Registration  - Pharmacy  - Lab - Cafe  2nd Floor: - PV Lab - Diagnostic Testing (echo, CT, nuclear med)  3rd Floor: - Vacant  4th Floor: - TCTS (cardiothoracic surgery) - AFib Clinic - Structural Heart Clinic - Vascular Surgery  - Vascular Ultrasound  5th Floor: - HeartCare Cardiology (general and EP) - Clinical  Pharmacy for coumadin, hypertension, lipid, weight-loss medications, and med management appointments    Valet parking services will be available as well.

## 2023-07-23 LAB — BASIC METABOLIC PANEL WITH GFR
BUN/Creatinine Ratio: 17 (ref 12–28)
BUN: 14 mg/dL (ref 8–27)
CO2: 25 mmol/L (ref 20–29)
Calcium: 8.9 mg/dL (ref 8.7–10.3)
Chloride: 106 mmol/L (ref 96–106)
Creatinine, Ser: 0.83 mg/dL (ref 0.57–1.00)
Glucose: 96 mg/dL (ref 70–99)
Potassium: 4 mmol/L (ref 3.5–5.2)
Sodium: 143 mmol/L (ref 134–144)
eGFR: 72 mL/min/{1.73_m2} (ref >59)

## 2023-07-23 LAB — CBC
Hematocrit: 31.7 % — ABNORMAL LOW (ref 34.0–46.6)
Hemoglobin: 9.8 g/dL — ABNORMAL LOW (ref 11.1–15.9)
MCH: 25.1 pg — ABNORMAL LOW (ref 26.6–33.0)
MCHC: 30.9 g/dL — ABNORMAL LOW (ref 31.5–35.7)
MCV: 81 fL (ref 79–97)
Platelets: 303 10*3/uL (ref 150–450)
RBC: 3.91 x10E6/uL (ref 3.77–5.28)
RDW: 14.1 % (ref 11.7–15.4)
WBC: 6.1 10*3/uL (ref 3.4–10.8)

## 2023-08-07 NOTE — Progress Notes (Signed)
 Remote pacemaker transmission.

## 2023-08-12 ENCOUNTER — Ambulatory Visit
Admission: RE | Admit: 2023-08-12 | Discharge: 2023-08-12 | Disposition: A | Discharge status: Home / Self Care | Payer: Medicare HMO | Source: Ambulatory Visit | Attending: Emergency Medicine

## 2023-08-12 ENCOUNTER — Ambulatory Visit: Payer: Self-pay | Admitting: Emergency Medicine

## 2023-08-12 DIAGNOSIS — M8588 Other specified disorders of bone density and structure, other site: Secondary | ICD-10-CM | POA: Diagnosis not present

## 2023-08-12 DIAGNOSIS — N958 Other specified menopausal and perimenopausal disorders: Secondary | ICD-10-CM | POA: Diagnosis not present

## 2023-08-12 DIAGNOSIS — Z13820 Encounter for screening for osteoporosis: Secondary | ICD-10-CM

## 2023-08-14 ENCOUNTER — Encounter: Payer: Self-pay | Admitting: Primary Care

## 2023-08-14 ENCOUNTER — Telehealth: Admitting: Primary Care

## 2023-08-14 VITALS — Ht 65.0 in | Wt 304.0 lb

## 2023-08-14 DIAGNOSIS — G4733 Obstructive sleep apnea (adult) (pediatric): Secondary | ICD-10-CM | POA: Diagnosis not present

## 2023-08-14 DIAGNOSIS — J4550 Severe persistent asthma, uncomplicated: Secondary | ICD-10-CM | POA: Diagnosis not present

## 2023-08-14 MED ORDER — ALBUTEROL SULFATE (2.5 MG/3ML) 0.083% IN NEBU: 2.5000 mg | INHALATION_SOLUTION | Freq: Four times a day (QID) | RESPIRATORY_TRACT | 12 refills | Status: AC | PRN

## 2023-08-14 MED ORDER — ALBUTEROL SULFATE HFA 108 (90 BASE) MCG/ACT IN AERS
2.0000 | INHALATION_SPRAY | Freq: Four times a day (QID) | RESPIRATORY_TRACT | 3 refills | Status: DC | PRN
Start: 2023-08-14 — End: 2024-02-03

## 2023-08-14 NOTE — Progress Notes (Signed)
 Virtual Visit via Telephone Note  I connected with Autumn Young on 08/14/23 at  9:30 AM EDT by telephone and verified that I am speaking with the correct person using two identifiers.  Location: Patient: Home Provider: Office    I discussed the limitations, risks, security and privacy concerns of performing an evaluation and management service by telephone and the availability of in person appointments. I also discussed with the patient that there may be a patient responsible charge related to this service. The patient expressed understanding and agreed to proceed.   History of Present Illness: 78 year old, former smoker. PMH  HTN, CHF, pulmonary hypertension, COPD, OSA, hx CVA, obesity. Patient of Dr. Dione Franks.   08/14/2023 Discussed the use of AI scribe software for clinical note transcription with the patient, who gave verbal consent to proceed.  History of Present Illness   Autumn Young is a 78 year old female with COPD who presents for follow-up regarding medication changes.  She is following up after a medication change from Advair  and Spiriva  to Breztri . She uses Breztri  twice daily and finds it effective, but continues to experience shortness of breath, requiring albuterol  every night and when walking to the car. Occasional wheezing is present, which she sometimes manages with Advair , although she prefers Breztri . She has run out of albuterol , needs refill.   She has a history of sleep apnea and owns a CPAP machine, which she uses inconsistently. She wakes up three to four times a night to use the restroom, affecting her CPAP use. She wears the CPAP when she feels it is necessary, but not consistently every night.      Observations/Objective:  Unable to connect to video, converted to televisit   Assessment and Plan:  1. Severe persistent asthma, unspecified whether complicated (Primary)  2. OSA on CPAP  Assessment and Plan    Asthma  Moderately well-controlled. She  prefers Breztri  over Advair  and uses albuterol  for breakthrough symptoms, especially when walking to the car. She ran out of albuterol  and has been using Advair  for breakthrough symptoms. Advair  is advised against due to overlapping long-acting medications with Breztri . - Continue Breztri , two puffs in the morning and two puffs in the evening. - Refill albuterol  and use every four to six hours for breakthrough symptoms. - Discontinue Advair  to avoid overlapping long-acting medications. - Consider adding a steroid inhaler for emergency use if needed, while trying to limit steroid use.  Sleep Apnea She has sleep apnea but is not consistently using CPAP due to nocturia, waking up three to four times a night to use the restroom. - Encourage use of CPAP as much as possible, including during naps or while sitting during the day.   Follow Up Instructions:  3 months with Dr. DESAI    I discussed the assessment and treatment plan with the patient. The patient was provided an opportunity to ask questions and all were answered. The patient agreed with the plan and demonstrated an understanding of the instructions.   The patient was advised to call back or seek an in-person evaluation if the symptoms worsen or if the condition fails to improve as anticipated.  I provided 22 minutes of non-face-to-face time during this encounter.   Antonio Baumgarten, NP

## 2023-08-14 NOTE — Patient Instructions (Signed)
 -  CHRONIC OBSTRUCTIVE PULMONARY DISEASE (COPD): COPD is a chronic lung condition that makes it hard to breathe. You are currently using Breztri , which you find effective. Continue using Breztri  with two puffs in the morning and two puffs in the evening. We will refill your albuterol  prescription, which you should use every four to six hours for breakthrough symptoms. Please discontinue Advair  to avoid overlapping medications. We may consider adding a steroid inhaler for emergency use if needed, but try to limit steroid use.  -SLEEP APNEA: Sleep apnea is a condition where you stop breathing for short periods while sleeping. You are not consistently using your CPAP machine due to waking up frequently at night. Please try to use your CPAP as much as possible, including during naps or while sitting during the day.  INSTRUCTIONS:  Please continue with your current medications as adjusted today. Use Breztri  as directed and refill your albuterol  for breakthrough symptoms. Discontinue Advair . Try to use your CPAP machine more consistently, even during naps or while sitting during the day. Follow up with us  if you have any new or worsening symptoms.

## 2023-08-18 ENCOUNTER — Ambulatory Visit (INDEPENDENT_AMBULATORY_CARE_PROVIDER_SITE_OTHER): Admitting: Emergency Medicine

## 2023-08-18 ENCOUNTER — Encounter: Payer: Self-pay | Admitting: Emergency Medicine

## 2023-08-18 VITALS — BP 176/92 | HR 72 | Temp 98.2°F | Ht 65.0 in | Wt 303.0 lb

## 2023-08-18 DIAGNOSIS — R600 Localized edema: Secondary | ICD-10-CM

## 2023-08-18 DIAGNOSIS — J4489 Other specified chronic obstructive pulmonary disease: Secondary | ICD-10-CM | POA: Diagnosis not present

## 2023-08-18 DIAGNOSIS — Z8673 Personal history of transient ischemic attack (TIA), and cerebral infarction without residual deficits: Secondary | ICD-10-CM | POA: Diagnosis not present

## 2023-08-18 DIAGNOSIS — D649 Anemia, unspecified: Secondary | ICD-10-CM | POA: Insufficient documentation

## 2023-08-18 DIAGNOSIS — I5032 Chronic diastolic (congestive) heart failure: Secondary | ICD-10-CM

## 2023-08-18 DIAGNOSIS — E785 Hyperlipidemia, unspecified: Secondary | ICD-10-CM | POA: Diagnosis not present

## 2023-08-18 DIAGNOSIS — I10 Essential (primary) hypertension: Secondary | ICD-10-CM

## 2023-08-18 DIAGNOSIS — Z950 Presence of cardiac pacemaker: Secondary | ICD-10-CM | POA: Diagnosis not present

## 2023-08-18 LAB — FOLATE: Folate: 9.8 ng/mL (ref >5.9)

## 2023-08-18 LAB — VITAMIN B12: Vitamin B-12: 266 pg/mL (ref 211–911)

## 2023-08-18 LAB — FERRITIN: Ferritin: 9.4 ng/mL — ABNORMAL LOW (ref 10.0–291.0)

## 2023-08-18 NOTE — Assessment & Plan Note (Signed)
 BP Readings from Last 3 Encounters:  08/18/23 (!) 176/92  07/22/23 (!) 160/70  06/12/23 (!) 158/82  Elevated blood pressure readings in the office but normal at home. Continue olmesartan  40 mg daily with torsemide  20 mg daily Cardiovascular risks associated with hypertension discussed

## 2023-08-18 NOTE — Progress Notes (Signed)
 Autumn Young 78 y.o.   Chief Complaint  Patient presents with   Results    Patient states that she was told to follow up with her pcp about lab results,  she doesn't know who called her and told her this. She mentions since she started breztri  and the eliquis , she's been having numbness at her finger tips, and stabbing pains in her legs and feet.     HISTORY OF PRESENT ILLNESS: This is a 78 y.o. female advised to follow-up with PCP after recent blood work showed worsening anemia. Denies GI bleed.  Denies melena. Multiple chronic medical problems including congestive heart failure and COPD. Also complaining of tingling and numbness to fingertips and toes.  Symptoms started after starting Breztri  and Eliquis . No other complaints or medical concerns today.   HPI   Prior to Admission medications   Medication Sig Start Date End Date Taking? Authorizing Provider  acetaminophen  (TYLENOL ) 500 MG tablet Take 500 mg by mouth every 6 (six) hours as needed (pain).    [provider]  albuterol  (PROVENTIL  HFA) 108 (90 Base) MCG/ACT inhaler Inhale 2 puffs into the lungs every 6 (six) hours as needed for wheezing or shortness of breath. 08/14/23   Antonio Baumgarten, NP  albuterol  (PROVENTIL ) (2.5 MG/3ML) 0.083% nebulizer solution Take 3 mLs (2.5 mg total) by nebulization every 6 (six) hours as needed for wheezing or shortness of breath. 08/14/23   Antonio Baumgarten, NP  apixaban  (ELIQUIS ) 5 MG TABS tablet Take 1 tablet (5 mg total) by mouth 2 (two) times daily. 06/12/23   Tylene Galla, PA-C  ascorbic acid (VITAMIN C) 1000 MG tablet Take 1,000 mg by mouth daily. 05/14/18   [provider]  benzonatate  (TESSALON ) 200 MG capsule Take 1 capsule (200 mg total) by mouth 3 (three) times daily as needed for cough. Patient not taking: Reported on 08/18/2023 04/07/23   Rai, Hurman Maiden, MD  Budeson-Glycopyrrol-Formoterol  (BREZTRI  AEROSPHERE) 160-9-4.8 MCG/ACT AERO Inhale 2 puffs into the  lungs in the morning and at bedtime. 05/14/23   Desai, Nikita S, MD  Cholecalciferol 25 MCG (1000 UT) tablet Take 1,000 Units by mouth in the morning.    [provider]  clotrimazole -betamethasone  (LOTRISONE ) cream Apply 1 Application topically 2 (two) times daily. 12/29/22   Elvira Hammersmith, MD  Cyanocobalamin  (VITAMIN B-12 PO) Take 6 drops by mouth in the morning.    [provider]  guaifenesin  (ROBITUSSIN) 100 MG/5ML syrup Take 200 mg by mouth 3 (three) times daily as needed for cough.    [provider]  montelukast  (SINGULAIR ) 10 MG tablet Take 1 tablet (10 mg total) by mouth at bedtime. 04/07/23   Rai, Hurman Maiden, MD  olmesartan  (BENICAR ) 40 MG tablet Take 1 tablet (40 mg total) by mouth daily. 12/29/22   Elvira Hammersmith, MD  Omega-3 Fatty Acids (OMEGA-3 PO) Take 1 capsule by mouth in the morning.    [provider]  pantoprazole  (PROTONIX ) 40 MG tablet TAKE 1 TABLET BY MOUTH EVERY DAY BEFORE BREAKFAST 06/23/23   Taresa Montville, Isidro Margo, MD  Red Yeast Rice Extract (RED YEAST RICE PO) Take 2 tablets by mouth daily.    [provider]  torsemide  (DEMADEX ) 20 MG tablet Take 1 tablet (20 mg total) by mouth daily. Take 2 tabs (40mg ) daily for 2 more days, then continue 1 tab (20mg ) DAILY 04/07/23   Rai, Hurman Maiden, MD    Allergies  Allergen Reactions   Shellfish Allergy Anaphylaxis  Zocor [Simvastatin] Swelling   Lasix [Furosemide] Swelling   Latex Rash   Penicillins Rash    Patient Active Problem List   Diagnosis Date Noted   Acute exacerbation of chronic obstructive pulmonary disease (COPD) (HCC) 03/31/2023   Esophageal dysphagia 02/11/2023   Yeast dermatitis 12/29/2022   Peripheral edema 09/25/2022   Chronic heart failure with preserved ejection fraction (HCC) 09/25/2022   Morbid obesity (HCC) 09/25/2022   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   Dyslipidemia 08/06/2021   History of CVA (cerebrovascular accident) 06/07/2021   OSA (obstructive sleep  apnea) 11/28/2015   COPD (chronic obstructive pulmonary disease) with chronic bronchitis (HCC) 06/26/2015   Pulmonary hypertension (HCC) 06/26/2015   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   Mild intermittent asthma without complication 11/19/2014   Essential hypertension 11/18/2014    Past Medical History:  Diagnosis Date   Asthma    COPD (chronic obstructive pulmonary disease) (HCC)    GERD (gastroesophageal reflux disease)    Hypertension    Stroke [facility])     Past Surgical History:  Procedure Laterality Date   PACEMAKER IMPLANT N/A 09/23/2021   Procedure: PACEMAKER IMPLANT;  Surgeon: Boyce Byes, MD;  Location: [facility] INVASIVE CV LAB;  Service: Cardiovascular;  Laterality: N/A;   TUBAL LIGATION      Social History   Socioeconomic History   Marital status: Divorced    Spouse name: Not on file   Number of children: Not on file   Years of education: Not on file   Highest education level: Not on file  Occupational History   Not on file  Tobacco Use   Smoking status: Former    Current packs/day: 0.00    Average packs/day: 3.0 packs/day for 2.0 years (6.0 ttl pk-yrs)    Types: Cigarettes    Start date: 73    Quit date: 48    Years since quitting: 40.4   Smokeless tobacco: Never  Vaping Use   Vaping status: Never Used  Substance and Sexual Activity   Alcohol use: No   Drug use: No   Sexual activity: Not on file  Other Topics Concern   Not on file  Social History Narrative   Lives with a friend.  3 children with one deceased.  Right handed. Husband has passed away   Social Drivers of Health   Financial Resource Strain: Low Risk  (11/18/2022)   Overall Financial Resource Strain (CARDIA)    Difficulty of Paying Living Expenses: Not hard at all  Food Insecurity: No Food Insecurity (04/08/2023)   Hunger Vital Sign    Worried About Running Out of Food in the Last Year: Never true    Ran Out of Food in the Last Year: Never true  Transportation Needs: No  Transportation Needs (04/01/2023)   PRAPARE - Administrator, Civil Service (Medical): No    Lack of Transportation (Non-Medical): No  Physical Activity: Inactive (11/18/2022)   Exercise Vital Sign    Days of Exercise per Week: 0 days    Minutes of Exercise per Session: 0 min  Stress: No Stress Concern Present (11/18/2022)   [employer] of Occupational Health - Occupational Stress Questionnaire    Feeling of Stress : Not at all  Social Connections: Socially Integrated (04/01/2023)   Social Connection and Isolation Panel [NHANES]    Frequency of Communication with Friends and Family: More than three times a week    Frequency of Social Gatherings with Friends and Family: More than three times a week  Attends Religious Services: More than 4 times per year    Active Member of Clubs or Organizations: Yes    Attends Banker Meetings: More than 4 times per year    Marital Status: Married  Catering manager Violence: Not At Risk (04/08/2023)   Humiliation, Afraid, Rape, and Kick questionnaire    Fear of Current or Ex-Partner: No    Emotionally Abused: No    Physically Abused: No    Sexually Abused: No    Family History  Problem Relation Age of Onset   Cancer Mother        Lung, cervix, mouth   Hypertension Father    Cancer Sister        Colon   Diabetes Sister    Hypertension Sister    Heart disease Sister    Cancer Brother    Hypertension Brother      Review of Systems  Constitutional: Negative.  Negative for chills and fever.  HENT: Negative.  Negative for congestion and sore throat.   Respiratory: Negative.  Negative for cough and shortness of breath.   Cardiovascular: Negative.  Negative for chest pain and palpitations.  Gastrointestinal:  Negative for abdominal pain, diarrhea, nausea and vomiting.  Genitourinary: Negative.  Negative for dysuria and hematuria.  Skin: Negative.  Negative for rash.  Neurological: Negative.  Negative for dizziness and  headaches.  All other systems reviewed and are negative.   Today's Vitals   08/18/23 1032  BP: (!) 176/92  Pulse: 72  Temp: 98.2 F (36.8 C)  TempSrc: Oral  SpO2: 93%  Weight: (!) 303 lb (137.4 kg)  Height: 5\' 5"  (1.651 m)   Body mass index is 50.42 kg/m.   Physical Exam Vitals reviewed.  Constitutional:      Appearance: Normal appearance. She is obese.  HENT:     Head: Normocephalic.     Mouth/Throat:     Mouth: Mucous membranes are moist.     Pharynx: Oropharynx is clear.  Eyes:     Extraocular Movements: Extraocular movements intact.     Pupils: Pupils are equal, round, and reactive to light.  Cardiovascular:     Rate and Rhythm: Normal rate and regular rhythm.     Pulses: Normal pulses.     Heart sounds: Normal heart sounds.  Pulmonary:     Effort: Pulmonary effort is normal.     Breath sounds: Normal breath sounds.  Musculoskeletal:     Cervical back: No tenderness.  Lymphadenopathy:     Cervical: No cervical adenopathy.  Skin:    General: Skin is warm and dry.     Capillary Refill: Capillary refill takes less than 2 seconds.  Neurological:     General: No focal deficit present.     Mental Status: She is alert and oriented to person, place, and time.  Psychiatric:        Mood and Affect: Mood normal.        Behavior: Behavior normal.      ASSESSMENT & PLAN: A total of 44 minutes was spent with the patient and counseling/coordination of care regarding preparing for this visit, review of most recent office visit notes, review of multiple chronic medical conditions and their management, review of all medications, review of most recent bloodwork results, review of health maintenance items, education on nutrition, prognosis, documentation, and need for follow up.   Problem List Items Addressed This Visit       Cardiovascular and Mediastinum   Essential hypertension (Chronic)  BP Readings from Last 3 Encounters:  08/18/23 (!) 176/92  07/22/23 (!)  160/70  06/12/23 (!) 158/82  Elevated blood pressure readings in the office but normal at home. Continue olmesartan  40 mg daily with torsemide  20 mg daily Cardiovascular risks associated with hypertension discussed       Chronic heart failure with preserved ejection fraction (HCC)   No signs or symptoms of acute CHF Stable and well-controlled Continue torsemide  10 mg daily and olmesartan  40 mg daily Diet and nutrition discussed        Respiratory   COPD (chronic obstructive pulmonary disease) with chronic bronchitis (HCC)   Chronic stable condition Recommend to continue Breztri  2 puffs twice a day and albuterol  as rescue inhaler        Other   History of CVA (cerebrovascular accident)   Clinically stable.  Secondary prevention of strokes discussed Continue daily baby aspirin       Dyslipidemia   Chronic stable condition Allergic to statins      Pacemaker   Recent evaluation with cardiologist for pacemaker inspection revealed no concerns.      Peripheral edema   Much improved.  Continue torsemide  20 mg daily      Morbid obesity (HCC)   Diet and nutrition discussed Advised to decrease amount of daily carbohydrate intake and daily calories and increase amount of plant-based protein in her diet      Anemia - Primary   Clinically stable. Differential diagnosis discussed We will do anemia panel today Recommend upper and lower endoscopies GI referral placed today      Relevant Orders   Vitamin B12   Folate   Iron and TIBC   Ferritin   Ambulatory referral to Gastroenterology   Patient Instructions  Anemia  Anemia is a condition in which there are not enough red blood cells or hemoglobin in the blood. Hemoglobin is a substance in red blood cells that carries oxygen . When you do not have enough red blood cells or hemoglobin (are anemic), your body cannot get enough oxygen , and your organs may not work properly. As a result, you may feel very tired or have other  problems. What are the causes? Common causes of anemia include: Excessive bleeding. Anemia can be caused by excessive bleeding inside or outside the body, including bleeding from the intestines or from heavy menstrual periods in females. Poor nutrition. Long-lasting (chronic) kidney, thyroid , and liver disease. Bone marrow disorders, spleen problems, and blood disorders. Cancer and treatments for cancer. Human immunodeficiency virus (HIV) and acquired immunodeficiency syndrome (AIDS). Infections, medicines, and autoimmune disorders that destroy red blood cells. What are the signs or symptoms? Symptoms of this condition include: Minor weakness. Dizziness. Headache, or difficulties concentrating and sleeping. Heartbeats that feel irregular or faster than normal (palpitations). Shortness of breath, especially with exercise. Pale skin, lips, and nails, or cold hands and feet. Upset stomach (indigestion) and nausea. Symptoms may occur suddenly or develop slowly. If your anemia is mild, you may not have symptoms. How is this diagnosed? This condition is diagnosed based on blood tests, your medical history, and a physical exam. In some cases, a test may be needed in which cells are removed from the soft tissue inside of a bone and looked at under a microscope (bone marrow biopsy). Your health care provider may also check your stool (feces) for blood and may do more testing to look for the cause of your bleeding. Other tests may include: Imaging tests, such as a CT scan or MRI.  A procedure to see inside your esophagus and stomach (endoscopy). The esophagus is the part of the body that moves food from your mouth to your stomach. A procedure to see inside your colon and rectum (colonoscopy). How is this treated? Treatment for this condition depends on the cause. If you continue to lose a lot of blood, you may need to be treated at a hospital. Treatment may include: Taking supplements of iron,  vitamin B12, or folic acid . Taking a hormone medicine (erythropoietin) that can help to stimulate red blood cell growth. Receiving donated blood through an IV (blood transfusion). This may be needed if you lose a lot of blood. Making changes to your diet. Having surgery to remove your spleen. Follow these instructions at home: Take over-the-counter and prescription medicines only as told by your health care provider. Take supplements only as told by your health care provider. Follow any diet instructions that you were given by your health care provider. Keep all follow-up visits. Your health care provider will want to recheck your blood tests. Contact a health care provider if: You develop new bleeding anywhere in the body. You are very weak. Get help right away if: You are short of breath. You have pain in your abdomen or chest. You are dizzy or feel faint. You have trouble concentrating. You have bloody stools, black stools, or tarry stools. You vomit repeatedly or you vomit up blood. These symptoms may be an emergency. Get help right away. Call 911. Do not wait to see if the symptoms will go away. Do not drive yourself to the hospital. Summary Anemia is a condition in which you do not have enough red blood cells or enough of a substance in your red blood cells that carries oxygen . Symptoms may occur suddenly or develop slowly. If your anemia is mild, you may not have symptoms. This condition is diagnosed with blood tests, a medical history, and a physical exam. Other tests may be needed. Treatment for this condition depends on the cause of the anemia. This information is not intended to replace advice given to you by your health care provider. Make sure you discuss any questions you have with your health care provider. Document Revised: 06/10/2021 Document Reviewed: 06/10/2021 Elsevier Patient Education  2024 Elsevier Inc.     Maryagnes Small, MD [facility]

## 2023-08-18 NOTE — Assessment & Plan Note (Signed)
Much improved.  Continue torsemide 20 mg daily

## 2023-08-18 NOTE — Assessment & Plan Note (Signed)
 Diet and nutrition discussed.  Advised to decrease amount of daily carbohydrate intake and daily calories and increase amount of plant-based protein in her diet.

## 2023-08-18 NOTE — Assessment & Plan Note (Signed)
Chronic stable condition Allergic to statins

## 2023-08-18 NOTE — Assessment & Plan Note (Signed)
 Chronic stable condition Recommend to continue Breztri  2 puffs twice a day and albuterol  as rescue inhaler

## 2023-08-18 NOTE — Assessment & Plan Note (Signed)
 Recent evaluation with cardiologist for pacemaker inspection revealed no concerns.

## 2023-08-18 NOTE — Assessment & Plan Note (Signed)
No signs or symptoms of acute CHF Stable and well-controlled Continue torsemide 10 mg daily and olmesartan 40 mg daily Diet and nutrition discussed

## 2023-08-18 NOTE — Assessment & Plan Note (Signed)
 Clinically stable. Differential diagnosis discussed We will do anemia panel today Recommend upper and lower endoscopies GI referral placed today

## 2023-08-18 NOTE — Assessment & Plan Note (Signed)
Clinically stable.  Secondary prevention of strokes discussed Continue daily baby aspirin

## 2023-08-18 NOTE — Patient Instructions (Signed)

## 2023-08-19 ENCOUNTER — Ambulatory Visit: Payer: Self-pay | Admitting: Emergency Medicine

## 2023-08-19 LAB — IRON, TOTAL/TOTAL IRON BINDING CAP
%SAT: 8 % — ABNORMAL LOW (ref 16–45)
Iron: 38 ug/dL — ABNORMAL LOW (ref 45–160)
TIBC: 477 ug/dL — ABNORMAL HIGH (ref 250–450)

## 2023-09-11 ENCOUNTER — Other Ambulatory Visit: Payer: Self-pay | Admitting: Emergency Medicine

## 2023-09-11 DIAGNOSIS — R600 Localized edema: Secondary | ICD-10-CM

## 2023-09-18 ENCOUNTER — Other Ambulatory Visit

## 2023-09-18 ENCOUNTER — Ambulatory Visit: Admitting: Physician Assistant

## 2023-09-18 ENCOUNTER — Telehealth: Payer: Self-pay | Admitting: Physician Assistant

## 2023-09-18 ENCOUNTER — Ambulatory Visit: Payer: Self-pay | Admitting: Physician Assistant

## 2023-09-18 ENCOUNTER — Telehealth: Payer: Self-pay

## 2023-09-18 ENCOUNTER — Encounter: Payer: Self-pay | Admitting: Physician Assistant

## 2023-09-18 ENCOUNTER — Other Ambulatory Visit: Payer: Self-pay

## 2023-09-18 VITALS — BP 150/80 | HR 71 | Ht 66.0 in | Wt 304.0 lb

## 2023-09-18 DIAGNOSIS — J4489 Other specified chronic obstructive pulmonary disease: Secondary | ICD-10-CM

## 2023-09-18 DIAGNOSIS — K219 Gastro-esophageal reflux disease without esophagitis: Secondary | ICD-10-CM

## 2023-09-18 DIAGNOSIS — E538 Deficiency of other specified B group vitamins: Secondary | ICD-10-CM

## 2023-09-18 DIAGNOSIS — Z860101 Personal history of adenomatous and serrated colon polyps: Secondary | ICD-10-CM

## 2023-09-18 DIAGNOSIS — I5030 Unspecified diastolic (congestive) heart failure: Secondary | ICD-10-CM | POA: Diagnosis not present

## 2023-09-18 DIAGNOSIS — D509 Iron deficiency anemia, unspecified: Secondary | ICD-10-CM | POA: Diagnosis not present

## 2023-09-18 DIAGNOSIS — Z6841 Body Mass Index (BMI) 40.0 and over, adult: Secondary | ICD-10-CM

## 2023-09-18 DIAGNOSIS — Z87891 Personal history of nicotine dependence: Secondary | ICD-10-CM

## 2023-09-18 DIAGNOSIS — I272 Pulmonary hypertension, unspecified: Secondary | ICD-10-CM

## 2023-09-18 DIAGNOSIS — G4733 Obstructive sleep apnea (adult) (pediatric): Secondary | ICD-10-CM

## 2023-09-18 DIAGNOSIS — R1084 Generalized abdominal pain: Secondary | ICD-10-CM

## 2023-09-18 DIAGNOSIS — Z7901 Long term (current) use of anticoagulants: Secondary | ICD-10-CM

## 2023-09-18 DIAGNOSIS — I4891 Unspecified atrial fibrillation: Secondary | ICD-10-CM

## 2023-09-18 DIAGNOSIS — Z800 Family history of malignant neoplasm of digestive organs: Secondary | ICD-10-CM

## 2023-09-18 LAB — COMPREHENSIVE METABOLIC PANEL WITH GFR
ALT: 17 U/L (ref 0–35)
AST: 23 U/L (ref 0–37)
Albumin: 3.8 g/dL (ref 3.5–5.2)
Alkaline Phosphatase: 63 U/L (ref 39–117)
BUN: 13 mg/dL (ref 6–23)
CO2: 29 meq/L (ref 19–32)
Calcium: 8.8 mg/dL (ref 8.4–10.5)
Chloride: 104 meq/L (ref 96–112)
Creatinine, Ser: 0.71 mg/dL (ref 0.40–1.20)
GFR: 80.8 mL/min (ref >60.00)
Glucose, Bld: 81 mg/dL (ref 70–99)
Potassium: 3.7 meq/L (ref 3.5–5.1)
Sodium: 139 meq/L (ref 135–145)
Total Bilirubin: 0.4 mg/dL (ref 0.2–1.2)
Total Protein: 7.6 g/dL (ref 6.0–8.3)

## 2023-09-18 LAB — IBC + FERRITIN
Ferritin: 7.5 ng/mL — ABNORMAL LOW (ref 10.0–291.0)
Iron: 31 ug/dL — ABNORMAL LOW (ref 42–145)
Saturation Ratios: 6.4 % — ABNORMAL LOW (ref 20.0–50.0)
TIBC: 485.8 ug/dL — ABNORMAL HIGH (ref 250.0–450.0)
Transferrin: 347 mg/dL (ref 212.0–360.0)

## 2023-09-18 LAB — CBC WITH DIFFERENTIAL/PLATELET
Basophils Absolute: 0 10*3/uL (ref 0.0–0.1)
Basophils Relative: 0.7 % (ref 0.0–3.0)
Eosinophils Absolute: 0.1 10*3/uL (ref 0.0–0.7)
Eosinophils Relative: 2.3 % (ref 0.0–5.0)
HCT: 31.3 % — ABNORMAL LOW (ref 36.0–46.0)
Hemoglobin: 9.7 g/dL — ABNORMAL LOW (ref 12.0–15.0)
Lymphocytes Relative: 29.1 % (ref 12.0–46.0)
Lymphs Abs: 1.5 10*3/uL (ref 0.7–4.0)
MCHC: 31 g/dL (ref 30.0–36.0)
MCV: 78.4 fl (ref 78.0–100.0)
Monocytes Absolute: 0.5 10*3/uL (ref 0.1–1.0)
Monocytes Relative: 10.1 % (ref 3.0–12.0)
Neutro Abs: 3.1 10*3/uL (ref 1.4–7.7)
Neutrophils Relative %: 57.8 % (ref 43.0–77.0)
Platelets: 273 10*3/uL (ref 150.0–400.0)
RBC: 3.99 Mil/uL (ref 3.87–5.11)
RDW: 15.8 % — ABNORMAL HIGH (ref 11.5–15.5)
WBC: 5.3 10*3/uL (ref 4.0–10.5)

## 2023-09-18 LAB — VITAMIN B12: Vitamin B-12: 269 pg/mL (ref 211–911)

## 2023-09-18 MED ORDER — NA SULFATE-K SULFATE-MG SULF 17.5-3.13-1.6 GM/177ML PO SOLN: 1.0000 | Freq: Once | ORAL | 0 refills | Status: AC

## 2023-09-18 MED ORDER — PANTOPRAZOLE SODIUM 40 MG PO TBEC: 40.0000 mg | DELAYED_RELEASE_TABLET | Freq: Two times a day (BID) | ORAL | 1 refills | Status: DC

## 2023-09-18 NOTE — Telephone Encounter (Signed)
 [facility] Pre-operative Risk Assessment     Request for surgical clearance:     Endoscopy Procedure  What type of surgery is being performed?     Endoscopy ad Colonoscopy  When is this surgery scheduled?     11/16/23  What type of clearance is required ?   Pharmacy  Are there any medications that need to be held prior to surgery and how long? Eliquis  2 days  Practice name and name of physician performing surgery?      [facility]  What is your office phone and fax number?      Phone- [phone]  Fax- [phone]  Anesthesia type (None, local, MAC, general) ?       MAC   Please route your response to [facility], CMA .

## 2023-09-18 NOTE — Telephone Encounter (Signed)
 Patient is calling back regarding her lab results. Patient is requesting a call back. Please advise.

## 2023-09-18 NOTE — Addendum Note (Signed)
 Addended by: Lanora Plana on: 09/18/2023 03:42 PM   Modules accepted: Orders

## 2023-09-18 NOTE — Progress Notes (Addendum)
 09/18/2023 Autumn Young [id number] [birth year]  Referring provider: Purcell Emil Schanz, * Primary GI doctor: Dr. Suzann  ASSESSMENT AND PLAN:  IDA/B12 def On Eliquis  07/22/2023  HGB 9.8 MCV 81 Platelets 303 08/18/2023 Iron 38 Ferritin 9.4 B12 266 Recent Labs    02/11/23 1144 03/31/23 1949 03/31/23 2001 04/02/23 0404 04/04/23 0433 04/05/23 0426 07/22/23 1049  HGB 10.9* 9.9* 11.2* 10.2* 10.7* 11.1* 9.8*  Baseline anemia appears to be about 12, patient began to have decline in about a year ago 07/22/2022 Started liquid iron about 2-4 weeks ago More DOE/weakness/RLS over last year, no CP, has had some improvement since being on the oral iron No Overt GI bleeding other than rare BRB small volume on TP In wheelchair, has constipation, on metamucil that helps, 2-3 Bm's a day  Some concern for [facility] ulcers, AVM's, malignancy with no overt GI bleeding, patient has history of complications during EGD/colon, patient's obesity, SOB, Afib, will plan for EGD/colon at the hospital with first available provider. Patient currently in a wheel chair.  ER precautions with the patient of any overt GI bleeding or worsening symptoms to go to the ER Will recheck CBC, iron/Ferritin/B12,  pending labs may see consider going through ER for expedited work up, if iron is not improving but HGB stable will refer for IV infusions Will increase pantoprazole  to 40 mg BID Will proceed with CT AB and pelvis with anemia, nausea, epigastric fullness on exam, rule out malignancy  Personal history of adenomatous polyps and family history of colon cancer in her sister  12/2013 Colonoscopy with 2 colon polyps, tics and internal hemorrhoids. Pathology showed adenomas  due 12/2016.  OVERDUE  GERD History of moderate hiatal hernia seen on CT 2023 12/2013 EGD showing small to moderate sized hiatal hernia and several diminutive stomach polyps; pathology benign. She is on protonix  40 mg once daily at night, she  is out of this medications Has nausea/vomiting some dysphagia with meats - increase protonix  to 40 mg BID for possible [place] lesions - eat small frequent meals  Atrial fibrillation with history of CVA History of complete heart block status post pacemaker On Eliquis  Hold Eliquis  for 2 days before procedure will instruct when and how to resume after procedure.  Patient understands that there is a low but real risk of cardiovascular event such as heart attack, stroke, or embolism /  thrombosis, or ischemia while off Eliquis   The patient consents to proceed.  Will communicate by phone or EMR with patient's prescribing provider to confirm that holding Eliquis  is reasonable in this case.   HFpEF 09/18/2022 echo 60-65%, grade 1 diastolic normal valves  COPD/asthma Not on oxygen   Morbid obesity  Body mass index is 49.07 kg/m.  -Patient has been advised to make an attempt to improve diet and exercise patterns to aid in weight loss. -Recommended diet heavy in fruits and veggies and low in animal meats, cheeses, and dairy products, appropriate calorie intake  I have reviewed the clinic note as outlined by Alan Coombs, PA and agree with the assessment, plan and medical decision making.  Ms. Dumm presents for evaluation and management of symptomatic anemia in the setting of previous history of adenomatous colon polyps and family history of colorectal cancer in a first-degree relative.  Last EGD and colonoscopy performed in 2015 showed a moderate hiatal hernia and diminutive stomach polyps; colonoscopy with 2 tubular adenomas.  Sister with colorectal cancer in her 77s.  Agree with EGD and colonoscopy.  Given  elevated BMI, shortness of breath, asthma and limited mobility these will need to be performed in a hospital-based setting with first available provider.  Autumn Hausen, MD    Patient Care Team: Purcell Emil Schanz, MD as PCP - General (Internal Medicine) Lavona Agent, MD as PCP  - Cardiology (Cardiology) Cindie Ole DASEN, MD as PCP - Electrophysiology (Cardiology) Georjean Darice HERO, MD as Consulting Physician (Neurology)  HISTORY OF PRESENT ILLNESS: 78 y.o. female with a past medical history listed below presents as a new patient for evaluation of anemia.   Previously seen by [facility] in 2017 for GERD. EGD/colonoscopy 12/2013 with EGD showing small to moderate sized hiatal hernia and several diminutive stomach polyps; pathology benign. Colonoscopy with 2 colon polyps, tics and internal hemorrhoids. Pathology showed adenomas and due 12/2016.  There is colon cancer in sister in her 41's.   Discussed the use of AI scribe software for clinical note transcription with the patient, who gave verbal consent to proceed.  History of Present Illness   Autumn Young is a 78 year old female with a history of hiatal hernia and precancerous colon polyps who presents with anemia and shortness of breath.  She has experienced significant shortness of breath and fatigue, which she attributes to anemia. Her hemoglobin levels have been declining over the past year, from a baseline of 12-13 to 9.8 in April 2023, and most recently to 8. She denies blood in her stool, except for occasional bright red blood associated with hemorrhoids after sitting on hard surfaces. She started taking an over-the-counter liquid iron supplement about 2-4 weeks ago and reports some improvement in her symptoms.  She has a history of atrial fibrillation and is on Eliquis . She has a pacemaker due to heart block. She also has asthma but is not on oxygen  therapy. She experiences wheezing, especially with exertion, and reports that her legs feel restless and uncomfortable. No chest pain but significant weakness and fatigue with minimal exertion.  Her diet is primarily vegetarian, and she eats her last meal by 5 PM. She reports difficulty with meat consumption, stating it feels 'too heavy' and causes discomfort.  She takes Metamucil regularly to manage constipation and has bowel movements two to three times daily. She is also on pantoprazole  for reflux, which she usually takes in the morning on an empty stomach, but she ran out of it two days ago. She experiences reflux symptoms, especially after eating later in the evening.  No recent changes in her bowel habits, such as diarrhea or constipation, and no significant abdominal pain except for tenderness in the mid-abdomen, which she attributes to coughing. She avoids orange juice due to exacerbation of her reflux symptoms.        She  reports that she quit smoking about 40 years ago. Her smoking use included cigarettes. She started smoking about 42 years ago. She has a 6 pack-year smoking history. She has never used smokeless tobacco. She reports that she does not drink alcohol and does not use drugs.  RELEVANT GI HISTORY, IMAGING AND LABS: Results   LABS Hemoglobin: 9.8 g/dL (95/76/7975)  DIAGNOSTIC Colonoscopy: Two colon polyps, diverticulosis, hemorrhoids (2015) Endoscopy: Hiatal hernia, benign polyps (2015)      CBC    Component Value Date/Time   WBC 6.1 07/22/2023 1049   WBC 11.3 (H) 04/05/2023 0426   RBC 3.91 07/22/2023 1049   RBC 4.19 04/05/2023 0426   HGB 9.8 (L) 07/22/2023 1049   HCT 31.7 (L) 07/22/2023 1049  PLT 303 07/22/2023 1049   MCV 81 07/22/2023 1049   MCH 25.1 (L) 07/22/2023 1049   MCH 26.5 04/05/2023 0426   MCHC 30.9 (L) 07/22/2023 1049   MCHC 30.7 04/05/2023 0426   RDW 14.1 07/22/2023 1049   LYMPHSABS 1.6 02/11/2023 1144   LYMPHSABS 2.0 09/12/2021 1207   MONOABS 0.6 02/11/2023 1144   EOSABS 0.1 02/11/2023 1144   EOSABS 0.1 09/12/2021 1207   BASOSABS 0.0 02/11/2023 1144   BASOSABS 0.0 09/12/2021 1207   Recent Labs    02/11/23 1144 03/31/23 1949 03/31/23 2001 04/02/23 0404 04/04/23 0433 04/05/23 0426 07/22/23 1049  HGB 10.9* 9.9* 11.2* 10.2* 10.7* 11.1* 9.8*    CMP     Component Value Date/Time   NA  143 07/22/2023 1049   K 4.0 07/22/2023 1049   CL 106 07/22/2023 1049   CO2 25 07/22/2023 1049   GLUCOSE 96 07/22/2023 1049   GLUCOSE 154 (H) 04/05/2023 0426   BUN 14 07/22/2023 1049   CREATININE 0.83 07/22/2023 1049   CALCIUM 8.9 07/22/2023 1049   PROT 8.1 02/11/2023 1144   ALBUMIN 3.9 02/11/2023 1144   AST 34 02/11/2023 1144   ALT 21 02/11/2023 1144   ALKPHOS 71 02/11/2023 1144   BILITOT 0.5 02/11/2023 1144   GFRNONAA >60 04/05/2023 0426   GFRAA >60 08/13/2017 0606      Latest Ref Rng & Units 02/11/2023   11:44 AM 07/22/2022    8:31 PM 06/06/2021    6:46 PM  Hepatic Function  Total Protein 6.0 - 8.3 g/dL 8.1  8.2  7.6   Albumin 3.5 - 5.2 g/dL 3.9  3.3  3.4   AST 0 - 37 U/L 34  34  38   ALT 0 - 35 U/L 21  21  25    Alk Phosphatase 39 - 117 U/L 71  82  57   Total Bilirubin 0.2 - 1.2 mg/dL 0.5  0.2  0.6       Current Medications:    Current Outpatient Medications (Cardiovascular):    torsemide  (DEMADEX ) 20 MG tablet, TAKE 1 TABLET BY MOUTH EVERY DAY   olmesartan  (BENICAR ) 40 MG tablet, Take 1 tablet (40 mg total) by mouth daily. (Patient not taking: Reported on 09/18/2023)  Current Outpatient Medications (Respiratory):    albuterol  (PROVENTIL  HFA) 108 (90 Base) MCG/ACT inhaler, Inhale 2 puffs into the lungs every 6 (six) hours as needed for wheezing or shortness of breath.   albuterol  (PROVENTIL ) (2.5 MG/3ML) 0.083% nebulizer solution, Take 3 mLs (2.5 mg total) by nebulization every 6 (six) hours as needed for wheezing or shortness of breath.   benzonatate  (TESSALON ) 200 MG capsule, Take 1 capsule (200 mg total) by mouth 3 (three) times daily as needed for cough.   Budeson-Glycopyrrol-Formoterol  (BREZTRI  AEROSPHERE) 160-9-4.8 MCG/ACT AERO, Inhale 2 puffs into the lungs in the morning and at bedtime.   guaifenesin  (ROBITUSSIN) 100 MG/5ML syrup, Take 200 mg by mouth 3 (three) times daily as needed for cough.   montelukast  (SINGULAIR ) 10 MG tablet, Take 1 tablet (10 mg total) by  mouth at bedtime. (Patient not taking: Reported on 09/18/2023)  Current Outpatient Medications (Analgesics):    acetaminophen  (TYLENOL ) 500 MG tablet, Take 500 mg by mouth every 6 (six) hours as needed (pain).  Current Outpatient Medications (Hematological):    apixaban  (ELIQUIS ) 5 MG TABS tablet, Take 1 tablet (5 mg total) by mouth 2 (two) times daily.   Cyanocobalamin  (VITAMIN B-12 PO), Take 6 drops by mouth in the morning.  Current Outpatient Medications (Other):    ascorbic acid (VITAMIN C) 1000 MG tablet, Take 1,000 mg by mouth daily.   Cholecalciferol 25 MCG (1000 UT) tablet, Take 1,000 Units by mouth in the morning.   clotrimazole -betamethasone  (LOTRISONE ) cream, Apply 1 Application topically 2 (two) times daily.   Omega-3 Fatty Acids (OMEGA-3 PO)*, Take 1 capsule by mouth in the morning.   Red Yeast Rice Extract (RED YEAST RICE PO), Take 2 tablets by mouth daily.   pantoprazole  (PROTONIX ) 40 MG tablet, Take 1 tablet (40 mg total) by mouth 2 (two) times daily before a meal. * These medications belong to multiple therapeutic classes and are listed under each applicable group.  Medical History:  Past Medical History:  Diagnosis Date   Asthma    COPD (chronic obstructive pulmonary disease) (HCC)    GERD (gastroesophageal reflux disease)    Hypertension    Stroke (HCC)    Allergies:  Allergies  Allergen Reactions   Shellfish Allergy Anaphylaxis   Zocor [Simvastatin] Swelling   Lasix [Furosemide] Swelling   Latex Rash   Penicillins Rash     Surgical History:  She  has a past surgical history that includes Tubal ligation and PACEMAKER IMPLANT (N/A, 09/23/2021). Family History:  Her family history includes Cancer in her brother, mother, and sister; Diabetes in her sister; Heart disease in her sister; Hypertension in her brother, father, and sister.  REVIEW OF SYSTEMS  : All other systems reviewed and negative except where noted in the History of Present Illness.  PHYSICAL  EXAM: BP (!) 150/80   Pulse 71   Ht 5' 6 (1.676 m)   Wt (!) 304 lb (137.9 kg)   BMI 49.07 kg/m  Physical Exam   GENERAL APPEARANCE: Obese, in no apparent distress. HEENT: No cervical lymphadenopathy, unremarkable thyroid , sclerae anicteric, conjunctiva pink. RESPIRATORY: Respiratory effort normal, breath sounds equal bilaterally with wheezing present diffusely CARDIO: Regular rate and rhythm with no murmurs, rubs, or gallops, peripheral pulses intact. ABDOMEN: Soft, non-distended, active bowel sounds in all four quadrants, tenderness in the epigastric area with a fullness, no rebound, no mass appreciated. RECTAL: Declines. MUSCULOSKELETAL: Full range of motion, gait not tested, in wheelchair, without edema. SKIN: Dry, intact without rashes or lesions. No jaundice. NEURO: Alert, oriented, no focal deficits. PSYCH: Cooperative, normal mood and affect.     Alan JONELLE Coombs, PA-C 2:03 PM

## 2023-09-18 NOTE — Patient Instructions (Addendum)
 Your provider has requested that you go to the basement level for lab work before leaving today. Press B on the elevator. The lab is located at the first door on the left as you exit the elevator.  Advised to go to the ER if there is any severe weakness, severe abdominal pain, vomit blood, dark red blood in your bowel movement, shortness of breath or chest pain.   Please take your proton pump inhibitor medication, Protonix  40 mg TWICE A DAY  Please take this medication 30 minutes to 1 hour before meals- this makes it more effective.  Avoid spicy and acidic foods Avoid fatty foods Limit your intake of coffee, tea, alcohol, and carbonated drinks Work to maintain a healthy weight Keep the head of the bed elevated at least 3 inches with blocks or a wedge pillow if you are having any nighttime symptoms Stay upright for 2 hours after eating Avoid meals and snacks three to four hours before bedtime  You have been scheduled for a CT scan of the abdomen and pelvis at [facility], 1st floor Radiology. You are scheduled on 09/25/23 at 1:30pm. You should arrive 15 minutes prior to your appointment time for registration.   If you have any questions regarding your exam or if you need to reschedule, you may call [facility] at [phone] between the hours of 8:00 am and 5:00 pm, Monday-Friday.   Please follow up sooner if symptoms increase or worsen  Due to recent changes in healthcare laws, you may see the results of your imaging and laboratory studies on MyChart before your provider has had a chance to review them.  We understand that in some cases there may be results that are confusing or concerning to you. Not all laboratory results come back in the same time frame and the provider may be waiting for multiple results in order to interpret others.  Please give us  48 hours in order for your provider to thoroughly review all the results before contacting the office for clarification of  your results.  _______________________________________________________  If your blood pressure at your visit was 140/90 or greater, please contact your primary care physician to follow up on this. _______________________________________________________  If you are age 34 or older, your body mass index should be between 23-30. Your Body mass index is 49.07 kg/m. If this is out of the aforementioned range listed, please consider follow up with your Primary Care Provider.  If you are age 74 or younger, your body mass index should be between 19-25. Your Body mass index is 49.07 kg/m. If this is out of the aformentioned range listed, please consider follow up with your Primary Care Provider.   ________________________________________________________  The [facility] providers would like to encourage you to use MYCHART to communicate with providers for non-urgent requests or questions.  Due to long hold times on the telephone, sending your provider a message by [facility] may be a faster and more efficient way to get a response.  Please allow 48 business hours for a response.  Please remember that this is for non-urgent requests.  _______________________________________________________

## 2023-09-21 NOTE — Telephone Encounter (Signed)
 Elspeth, RN reviewed results with patient. See 6/20 lab result note.

## 2023-09-22 ENCOUNTER — Ambulatory Visit (INDEPENDENT_AMBULATORY_CARE_PROVIDER_SITE_OTHER): Payer: Medicare HMO

## 2023-09-22 DIAGNOSIS — I442 Atrioventricular block, complete: Secondary | ICD-10-CM

## 2023-09-23 NOTE — Telephone Encounter (Signed)
 Patient with diagnosis of atrial fibrillation on Eliquis  for anticoagulation.    What type of surgery is being performed?     Endoscopy ad Colonoscopy  When is this surgery scheduled?     11/16/23    CHA2DS2-VASc Score = 7   This indicates a 11.2% annual risk of stroke. The patient's score is based upon: CHF History: 0 HTN History: 1 Diabetes History: 1 Stroke History: 2 Vascular Disease History: 0 Age Score: 2 Gender Score: 1    CrCl 92 (with adjusted body weight) Platelet count 273  Patient has not had an Afib/aflutter ablation within the last 3 months or DCCV within the last 30 days  Per office protocol, patient can hold Eliquis  for 2 days prior to procedure.   Patient will not need bridging with Lovenox  (enoxaparin ) around procedure.  **This guidance is not considered finalized until pre-operative APP has relayed final recommendations.**

## 2023-09-25 ENCOUNTER — Ambulatory Visit: Payer: Self-pay | Admitting: Cardiology

## 2023-09-25 ENCOUNTER — Ambulatory Visit (HOSPITAL_BASED_OUTPATIENT_CLINIC_OR_DEPARTMENT_OTHER)
Admission: RE | Admit: 2023-09-25 | Discharge: 2023-09-25 | Disposition: A | Discharge status: Home / Self Care | Source: Ambulatory Visit | Attending: Physician Assistant | Admitting: Physician Assistant

## 2023-09-25 DIAGNOSIS — D259 Leiomyoma of uterus, unspecified: Secondary | ICD-10-CM | POA: Diagnosis not present

## 2023-09-25 DIAGNOSIS — K449 Diaphragmatic hernia without obstruction or gangrene: Secondary | ICD-10-CM | POA: Diagnosis not present

## 2023-09-25 DIAGNOSIS — K5730 Diverticulosis of large intestine without perforation or abscess without bleeding: Secondary | ICD-10-CM | POA: Diagnosis not present

## 2023-09-25 DIAGNOSIS — R112 Nausea with vomiting, unspecified: Secondary | ICD-10-CM | POA: Diagnosis not present

## 2023-09-25 DIAGNOSIS — R1084 Generalized abdominal pain: Secondary | ICD-10-CM | POA: Insufficient documentation

## 2023-09-25 LAB — CUP PACEART REMOTE DEVICE CHECK
Battery Voltage: 22
Date Time Interrogation Session: 20250624100939
Implantable Lead Connection Status: 753985
Implantable Lead Connection Status: 753985
Implantable Lead Implant Date: 20230626
Implantable Lead Implant Date: 20230626
Implantable Lead Location: 753859
Implantable Lead Location: 753860
Implantable Lead Model: 377
Implantable Lead Model: 377
Implantable Lead Serial Number: 8000898701
Implantable Lead Serial Number: 8000923303
Implantable Pulse Generator Implant Date: 20230626
Pulse Gen Model: 407145
Pulse Gen Serial Number: 70433047

## 2023-09-25 MED ORDER — IOHEXOL 300 MG/ML  SOLN
100.0000 mL | Freq: Once | INTRAMUSCULAR | Status: AC | PRN
Start: 2023-09-25 — End: 2023-09-25
  Administered 2023-09-25: 100 mL via INTRAVENOUS

## 2023-09-25 NOTE — Telephone Encounter (Signed)
   Patient Name: Autumn Young  DOB: 1946-03-06 MRN: [id number]  Primary Cardiologist: Lynwood Schilling, MD  Clinical pharmacists have reviewed the patient's past medical history, labs, and current medications as part of preoperative protocol coverage. The following recommendations have been made:  Patient with diagnosis of atrial fibrillation on Eliquis  for anticoagulation.     What type of surgery is being performed?     Endoscopy ad Colonoscopy  When is this surgery scheduled?     11/16/23      CHA2DS2-VASc Score = 7   This indicates a 11.2% annual risk of stroke. The patient's score is based upon: CHF History: 0 HTN History: 1 Diabetes History: 1 Stroke History: 2 Vascular Disease History: 0 Age Score: 2 Gender Score: 1     CrCl 92 (with adjusted body weight) Platelet count 273   Patient has not had an Afib/aflutter ablation within the last 3 months or DCCV within the last 30 days   Per office protocol, patient can hold Eliquis  for 2 days prior to procedure.   Patient will not need bridging with Lovenox  (enoxaparin ) around procedure.    I will route this recommendation to the requesting party via Epic fax function and remove from pre-op pool.  Please call with questions.  Lamarr Satterfield, NP 09/25/2023, 8:28 AM

## 2023-10-06 ENCOUNTER — Other Ambulatory Visit: Payer: Self-pay | Admitting: Medical Oncology

## 2023-10-06 DIAGNOSIS — D649 Anemia, unspecified: Secondary | ICD-10-CM

## 2023-10-07 ENCOUNTER — Inpatient Hospital Stay

## 2023-10-07 ENCOUNTER — Encounter: Admitting: Internal Medicine

## 2023-10-07 ENCOUNTER — Other Ambulatory Visit

## 2023-10-07 ENCOUNTER — Other Ambulatory Visit: Payer: Self-pay

## 2023-10-09 MED ORDER — BENZONATATE 200 MG PO CAPS: 200.0000 mg | ORAL_CAPSULE | Freq: Three times a day (TID) | ORAL | 0 refills | Status: AC | PRN

## 2023-10-09 NOTE — Addendum Note (Signed)
 Addended by: CRAIG PALMA on: 10/09/2023 03:42 PM   Modules accepted: Orders

## 2023-10-12 NOTE — Assessment & Plan Note (Signed)
 She was referred by her GI provider.  Most recent blood work done 09/18/2023.  Hgb 9.7, HCT 31.3, MCV 78.4, platelets 273.0.  B12 low/normal at 269.  Iron 31, sat ratio 6.4%, ferritin 7.5, transferrin 347.0, and  TIBC 485.8.  She is scheduled for colonoscopy on 11/16/2023. The patient had labs drawn prior to her visit. She is anemic. Her Hgb is 9.9, Hct 32.1, MCV 80.9. her iron is improved at 32, TIBC 507, saturation ratio is 6%, and transferrin is 475. Her B12 level is improved significantly at 824.  Though still awaiting the results of ferritin, orders were sent to [facility] for her to have IV Venofer 200 mg weekly for 5 weeks.  She is scheduled for colonoscopy and endoscopy with her GI provider on 11/16/2023.  She should continue with oral liquid iron supplement daily and B12 (selenium) daily.  Will recheck her labs in 6 weeks and then monthly after that. Will continue treatment with IV iron as indicated.  Plan for follow up in 3 months with lab recheck.

## 2023-10-12 NOTE — Progress Notes (Unsigned)
 [facility]   [phone] [phone]   Clinic New consult Note   Patient Care Team: Purcell Emil Schanz, MD as PCP - General (Internal Medicine) Lavona Agent, MD as PCP - Cardiology (Cardiology) Cindie Ole DASEN, MD as PCP - Electrophysiology (Cardiology) Georjean Darice HERO, MD as Consulting Physician (Neurology) 10/13/2023  CHIEF COMPLAINTS/PURPOSE OF CONSULTATION:  Iron deficiency anemia and B12 deficiency   HISTORY OF PRESENTING ILLNESS:  Autumn Young 78 y.o. female is here because of iron deficiency anemia.  She was referred by her GI provider.  Most recent blood work done 09/18/2023.  Hgb 9.7, HCT 31.3, MCV 78.4, platelets 273.0.  B12 low/normal at 269.  Iron 31, sat ratio 6.4%, ferritin 7.5, transferrin 347.0, and  TIBC 485.8.  She is scheduled for colonoscopy on 11/16/2023.  She has A-fib and CVA.  She has a pacemaker.  She is on Eliquis  twice daily.she also suffers from asthma. She uses a maintenance inhaler and a rescue inhaler as needed.  She reports feeling tired and easy fatigability. She does get short of breath, especially with exertion. She denies chest pain or chest pressure. She denies any abnormal bleeding such as hematuria, blood in stool, hematemesis, hemoptysis, or epistaxis. She denies chest pain or chest pressure. She denies headaches or visual disturbances. She denies abdominal pain, nausea, vomiting, or changes in bowel or bladder habits.  She does mention some new constipation since starting on oral iron liquid supplement.  Socially, the patient is widowed. She has three children, two are living. She had one son pass away during an epileptic seizure. He fell and hit his head, which led to his death. Her two living children live in the area. She also has a granddaughter who is always with her. The patient is retired. She states that she would work if she didn't feel so tired. She is a former smoker. She quit smoking over 50 years ago.  She will drink 1/2 gladd of wine every so often. She does not use illegal or illicit drugs.  She has a strong family history of cancer. This is more significant on her mother's side. She states that her mother did have cancer in the mouth which spread to her lungs. Her mother passed away from this cancer. She has two sisters who have battled colon cancer. Her older sister had hemicolectomy and is still living. Her youngest sister passed away from colon cancer. She has another sister who had breast cancer. Both of her brothers have prostate cancer. The patient has no personal history of cancer.   She was found to have abnormal CBC from 09/18/2022.  She denies recent chest pain on exertion, pre-syncopal episodes, or palpitations. She does report shortness of breath with exertion and easy fatigability.  She had not noticed any recent bleeding such as epistaxis, hematuria or hematochezia The patient denies over the counter NSAID ingestion. She is on antiplatelets agents.She takes Eliquis  due to atrial fibrillation. Her last colonoscopy was 08/18/2019. She is scheduled for colonoscopy/endoscopy 11/16/2023. She did have CT abdomen and pelvis on 09/25/2023. She was noted to have diverticulosis without diverticulitis. She also has a moderate hiatal hernia.  She had no prior history or diagnosis of cancer. Her age appropriate screening programs are up-to-date. She denies any pica and eats a variety of diet. She states that she has been craving steak and cheesecake recently. These are not foods which she generally prefers.  She never donated blood or received blood transfusion The patient was  prescribed oral iron supplements and she takes an OTC liquid supplement. She takes 1 tablespoon daily. She also takes OTC selenium supplement.    REVIEW OF SYSTEMS:   Constitutional: Denies fevers, chills or abnormal night sweats. She does have moderate tiredness and easy fatigability.  Eyes: Denies blurriness of vision,  double vision or watery eyes Ears, nose, mouth, throat, and face: Denies mucositis or sore throat Respiratory: Denies cough. She has underlying asthma and has intermittent wheezing. She also has shortness of breath with exertion.  Cardiovascular: Denies palpitation, chest discomfort or lower extremity swelling Gastrointestinal:  Denies nausea, heartburn or change in bowel habits. She has noted constipation since starting on oral iron . Skin: Denies abnormal skin rashes Lymphatics: Denies new lymphadenopathy or easy bruising Neurological:Denies numbness, tingling or new weaknesses Behavioral/Psych: Mood is stable, no new changes   All other systems were reviewed with the patient and are negative.   MEDICAL HISTORY:  Past Medical History:  Diagnosis Date   Asthma    COPD (chronic obstructive pulmonary disease) (HCC)    GERD (gastroesophageal reflux disease)    Hypertension    Stroke [facility])     SURGICAL HISTORY: Past Surgical History:  Procedure Laterality Date   PACEMAKER IMPLANT N/A 09/23/2021   Procedure: PACEMAKER IMPLANT;  Surgeon: Cindie Ole DASEN, MD;  Location: [facility] INVASIVE CV LAB;  Service: Cardiovascular;  Laterality: N/A;   TUBAL LIGATION      SOCIAL HISTORY: Social History   Socioeconomic History   Marital status: Widowed    Spouse name: Not on file   Number of children: 3   Years of education: Not on file   Highest education level: Not on file  Occupational History   Occupation: retired  Tobacco Use   Smoking status: Former    Current packs/day: 0.00    Average packs/day: 3.0 packs/day for 2.0 years (6.0 ttl pk-yrs)    Types: Cigarettes    Start date: 43    Quit date: 1985    Years since quitting: 40.5   Smokeless tobacco: Never  Vaping Use   Vaping status: Never Used  Substance and Sexual Activity   Alcohol use: No   Drug use: No   Sexual activity: Not on file  Other Topics Concern   Not on file  Social History Narrative   Lives with a friend.   3 children with one deceased.  Right handed. Husband has passed away   Social Drivers of Health   Financial Resource Strain: Low Risk  (11/18/2022)   Overall Financial Resource Strain (CARDIA)    Difficulty of Paying Living Expenses: Not hard at all  Food Insecurity: No Food Insecurity (10/13/2023)   Hunger Vital Sign    Worried About Running Out of Food in the Last Year: Never true    Ran Out of Food in the Last Year: Never true  Transportation Needs: No Transportation Needs (10/13/2023)   PRAPARE - Administrator, Civil Service (Medical): No    Lack of Transportation (Non-Medical): No  Physical Activity: Inactive (11/18/2022)   Exercise Vital Sign    Days of Exercise per Week: 0 days    Minutes of Exercise per Session: 0 min  Stress: No Stress Concern Present (11/18/2022)   [employer] of Occupational Health - Occupational Stress Questionnaire    Feeling of Stress : Not at all  Social Connections: Socially Integrated (04/01/2023)   Social Connection and Isolation Panel    Frequency of Communication with Friends and Family: More  than three times a week    Frequency of Social Gatherings with Friends and Family: More than three times a week    Attends Religious Services: More than 4 times per year    Active Member of [employer] or Organizations: Yes    Attends Engineer, structural: More than 4 times per year    Marital Status: Married  Catering manager Violence: Not At Risk (10/13/2023)   Humiliation, Afraid, Rape, and Kick questionnaire    Fear of Current or Ex-Partner: No    Emotionally Abused: No    Physically Abused: No    Sexually Abused: No    FAMILY HISTORY: Family History  Problem Relation Age of Onset   Cancer Mother        Lung, cervix, mouth   Hypertension Father    Cancer Sister        Colon   Diabetes Sister    Hypertension Sister    Heart disease Sister    Cancer Brother    Hypertension Brother    Liver disease Neg Hx    Esophageal cancer  Neg Hx     ALLERGIES:  is allergic to shellfish allergy, zocor [simvastatin], lasix [furosemide], latex, and penicillins.  MEDICATIONS:  Current Outpatient Medications  Medication Sig Dispense Refill   acetaminophen  (TYLENOL ) 500 MG tablet Take 500 mg by mouth every 6 (six) hours as needed (pain).     albuterol  (PROVENTIL  HFA) 108 (90 Base) MCG/ACT inhaler Inhale 2 puffs into the lungs every 6 (six) hours as needed for wheezing or shortness of breath. 18 g 3   albuterol  (PROVENTIL ) (2.5 MG/3ML) 0.083% nebulizer solution Take 3 mLs (2.5 mg total) by nebulization every 6 (six) hours as needed for wheezing or shortness of breath. 75 mL 12   apixaban  (ELIQUIS ) 5 MG TABS tablet Take 1 tablet (5 mg total) by mouth 2 (two) times daily. 56 tablet    ascorbic acid (VITAMIN C) 1000 MG tablet Take 1,000 mg by mouth daily.     benzonatate  (TESSALON ) 200 MG capsule Take 1 capsule (200 mg total) by mouth 3 (three) times daily as needed for cough. 60 capsule 0   Budeson-Glycopyrrol-Formoterol  (BREZTRI  AEROSPHERE) 160-9-4.8 MCG/ACT AERO Inhale 2 puffs into the lungs in the morning and at bedtime. 1 each 11   Cholecalciferol 25 MCG (1000 UT) tablet Take 1,000 Units by mouth in the morning.     clotrimazole -betamethasone  (LOTRISONE ) cream Apply 1 Application topically 2 (two) times daily. 30 g 2   Cyanocobalamin  (VITAMIN B-12 PO) Take 6 drops by mouth in the morning.     guaifenesin  (ROBITUSSIN) 100 MG/5ML syrup Take 200 mg by mouth 3 (three) times daily as needed for cough.     montelukast  (SINGULAIR ) 10 MG tablet Take 1 tablet (10 mg total) by mouth at bedtime. 30 tablet 3   olmesartan  (BENICAR ) 40 MG tablet Take 1 tablet (40 mg total) by mouth daily. 90 tablet 3   Omega-3 Fatty Acids (OMEGA-3 PO) Take 1 capsule by mouth in the morning.     pantoprazole  (PROTONIX ) 40 MG tablet Take 1 tablet (40 mg total) by mouth 2 (two) times daily before a meal. 180 tablet 1   Red Yeast Rice Extract (RED YEAST RICE PO) Take  2 tablets by mouth daily.     torsemide  (DEMADEX ) 20 MG tablet TAKE 1 TABLET BY MOUTH EVERY DAY 90 tablet 1   No current facility-administered medications for this visit.    PHYSICAL EXAMINATION: ECOG PERFORMANCE STATUS: 1 -  Symptomatic but completely ambulatory  Vitals:   10/13/23 1104 10/13/23 1108  BP: (!) 210/110 (!) 160/62  Pulse:  66  Resp:  17  Temp:  97.8 F (36.6 C)  SpO2:  97%   Filed Weights   10/13/23 1108  Weight: (!) 301 lb (136.5 kg)    GENERAL:alert, no distress and comfortable SKIN: skin color, texture, turgor are normal, no rashes or significant lesions EYES: normal, conjunctiva are pink and non-injected, sclera clear OROPHARYNX:no exudate, no erythema and lips, buccal mucosa, and tongue normal  NECK: supple, thyroid  normal size, non-tender, without nodularity LYMPH:  no palpable lymphadenopathy in the cervical, axillary or inguinal LUNGS: clear to auscultation and percussion with normal breathing effort HEART: regular rate & rhythm. no lower extremity edema. She has soft, blowing systolic murmur.  ABDOMEN:abdomen soft, non-tender and normal bowel sounds Musculoskeletal:no cyanosis of digits and no clubbing  PSYCH: alert & oriented x 3 with fluent speech NEURO: no focal motor/sensory deficits  LABORATORY DATA:  I have reviewed the data as listed    Latest Ref Rng & Units 10/13/2023   10:22 AM 09/18/2023    2:41 PM 07/22/2023   10:49 AM  CBC  WBC 4.0 - 10.5 K/uL 5.8  5.3  6.1   Hemoglobin 12.0 - 15.0 g/dL 9.9  9.7  9.8   Hematocrit 36.0 - 46.0 % 32.1  31.3  31.7   Platelets 150 - 400 K/uL 298  273.0  303        Latest Ref Rng & Units 10/13/2023   10:22 AM 09/18/2023    2:41 PM 07/22/2023   10:49 AM  CMP  Glucose 70 - 99 mg/dL 94  81  96   BUN 8 - 23 mg/dL 11  13  14    Creatinine 0.44 - 1.00 mg/dL 9.22  9.28  9.16   Sodium 135 - 145 mmol/L 140  139  143   Potassium 3.5 - 5.1 mmol/L 3.9  3.7  4.0   Chloride 98 - 111 mmol/L 106  104  106   CO2 22  - 32 mmol/L 30  29  25    Calcium 8.9 - 10.3 mg/dL 8.8  8.8  8.9   Total Protein 6.5 - 8.1 g/dL 7.7  7.6    Total Bilirubin 0.0 - 1.2 mg/dL 0.4  0.4    Alkaline Phos 38 - 126 U/L 73  63    AST 15 - 41 U/L 26  23    ALT 0 - 44 U/L 19  17     Iron/TIBC/Ferritin/ %Sat    Component Value Date/Time   IRON 32 10/13/2023 1022   TIBC 507 (H) 10/13/2023 1022   FERRITIN 14 10/13/2023 1023   IRONPCTSAT 6 (L) 10/13/2023 1022   IRONPCTSAT 8 (L) 08/18/2023 1105     RADIOGRAPHIC STUDIES: CT ABDOMEN PELVIS W CONTRAST Result Date: 09/26/2023 CLINICAL DATA:  Left lower quadrant pain. Epigastric fullness. Nausea and vomiting. Anemia. EXAM: CT ABDOMEN AND PELVIS WITH CONTRAST TECHNIQUE: Multidetector CT imaging of the abdomen and pelvis was performed using the standard protocol following bolus administration of intravenous contrast. RADIATION DOSE REDUCTION: This exam was performed according to the departmental dose-optimization program which includes automated exposure control, adjustment of the mA and/or kV according to patient size and/or use of iterative reconstruction technique. CONTRAST:  OMNIPAQUE  IOHEXOL  300 MG/ML  SOLN COMPARISON:  06/07/2021 FINDINGS: Lower Chest: No acute findings. Hepatobiliary: No suspicious hepatic masses identified. Gallbladder is unremarkable. No evidence of biliary ductal  dilatation. Pancreas:  No mass or inflammatory changes. Spleen: Within normal limits in size and appearance. Adrenals/Urinary Tract: No suspicious masses identified. No evidence of ureteral calculi or hydronephrosis. Stomach/Bowel: Moderate hiatal hernia again seen. Diffuse colonic diverticulosis again seen, without signs of diverticulitis. No evidence of obstruction, inflammatory process or abnormal fluid collections. Vascular/Lymphatic: No pathologically enlarged lymph nodes. No acute vascular findings. Reproductive: 2.3 cm calcified fibroid again seen in the right anterior uterine corpus. Adnexal regions  are unremarkable. Other:  None. Musculoskeletal:  No suspicious bone lesions identified. IMPRESSION: No acute findings. Colonic diverticulosis, without radiographic evidence of diverticulitis. Moderate hiatal hernia. Small calcified uterine fibroid. Electronically Signed   By: Norleen DELENA Kil M.D.   On: 09/26/2023 13:35   CUP PACEART REMOTE DEVICE CHECK Result Date: 09/25/2023 PPM Scheduled remote reviewed. Normal device function.  Presenting rhythm:  AS/VS Next remote 91 days. LA, CVRS  Iron deficiency anemia due to chronic blood loss Assessment & Plan: She was referred by her GI provider.  Most recent blood work done 09/18/2023.  Hgb 9.7, HCT 31.3, MCV 78.4, platelets 273.0.  B12 low/normal at 269.  Iron 31, sat ratio 6.4%, ferritin 7.5, transferrin 347.0, and  TIBC 485.8.  She is scheduled for colonoscopy on 11/16/2023. The patient had labs drawn prior to her visit. She is anemic. Her Hgb is 9.9, Hct 32.1, MCV 80.9. her iron is improved at 32, TIBC 507, saturation ratio is 6%, and transferrin is 475. Her B12 level is improved significantly at 824.  Though still awaiting the results of ferritin, orders were sent to [facility] for her to have IV Venofer 200 mg weekly for 5 weeks.  She is scheduled for colonoscopy and endoscopy with her GI provider on 11/16/2023.  She should continue with oral liquid iron supplement daily and B12 (selenium) daily.  Will recheck her labs in 6 weeks and then monthly after that. Will continue treatment with IV iron as indicated.  Plan for follow up in 3 months with lab recheck.   Orders: -     CBC with Differential (Cancer Center Only); Standing -     Iron and Iron Binding Capacity ([facility] only); Standing -     Ferritin; Standing -     Vitamin B12; Standing  Other orders -     Acetaminophen  -     diphenhydrAMINE  HCl -     Iron Sucrose -     Famotidine in NaCl -     Sodium Chloride  -     methylPREDNISolone  Sodium Succ -     diphenhydrAMINE   HCl -     Albuterol  Sulfate HFA -     EPINEPHrine     The patient was seen along with Dr. Lanny today. Time spent with the patient was approximately 30 minutes. This time included reviewing progress notes, labs, imaging studies, and discussing plan for follow up.  All questions were answered. The patient knows to call the clinic with any problems, questions or concerns. The total time spent in the appointment was 30 minutes.     Powell FORBES Lessen, NP 10/13/2023 2:16 PM  Addendum I have seen the patient, examined her. I agree with the assessment and and plan and have edited the notes.   Patient is 35-year-old female with past medical history of COPD, hypertension, and stroke, was referred for iron deficient anemia.  She developed a mild anemia in the past 1 year, recent lab in June 2025 showed hemoglobin 9.7, ferritin 7.5, increased TIBC  and low serum iron, consistent with iron deficiency.  She has no overt bleeding.  I recommend EGD and colonoscopy to rule out GI bleeding especially malignancy.  She is scheduled for mid August 2025.  She started oral iron about 1 month ago, repeated CBC today showed hemoglobin 9.9, iron studies still pending, I recommend IV iron.  Will arrange that for her at the [facility] history.  Will monitor her labs CBC and ferritin every months, and follow-up in 3 months.  All questions were answered.  I spent a total of 30 minutes for her visit today.  Onita Mattock MD 10/13/2023

## 2023-10-13 ENCOUNTER — Inpatient Hospital Stay

## 2023-10-13 ENCOUNTER — Inpatient Hospital Stay: Attending: Nurse Practitioner | Admitting: Nurse Practitioner

## 2023-10-13 VITALS — BP 160/62 | HR 66 | Temp 97.8°F | Resp 17 | Wt 301.0 lb

## 2023-10-13 DIAGNOSIS — I10 Essential (primary) hypertension: Secondary | ICD-10-CM | POA: Insufficient documentation

## 2023-10-13 DIAGNOSIS — Z87891 Personal history of nicotine dependence: Secondary | ICD-10-CM | POA: Diagnosis not present

## 2023-10-13 DIAGNOSIS — D500 Iron deficiency anemia secondary to blood loss (chronic): Secondary | ICD-10-CM | POA: Insufficient documentation

## 2023-10-13 DIAGNOSIS — K449 Diaphragmatic hernia without obstruction or gangrene: Secondary | ICD-10-CM | POA: Diagnosis not present

## 2023-10-13 DIAGNOSIS — Z8673 Personal history of transient ischemic attack (TIA), and cerebral infarction without residual deficits: Secondary | ICD-10-CM | POA: Insufficient documentation

## 2023-10-13 DIAGNOSIS — J4489 Other specified chronic obstructive pulmonary disease: Secondary | ICD-10-CM | POA: Diagnosis not present

## 2023-10-13 DIAGNOSIS — Z7901 Long term (current) use of anticoagulants: Secondary | ICD-10-CM | POA: Insufficient documentation

## 2023-10-13 DIAGNOSIS — E538 Deficiency of other specified B group vitamins: Secondary | ICD-10-CM | POA: Diagnosis not present

## 2023-10-13 DIAGNOSIS — Z79899 Other long term (current) drug therapy: Secondary | ICD-10-CM | POA: Insufficient documentation

## 2023-10-13 DIAGNOSIS — K219 Gastro-esophageal reflux disease without esophagitis: Secondary | ICD-10-CM | POA: Insufficient documentation

## 2023-10-13 DIAGNOSIS — K5900 Constipation, unspecified: Secondary | ICD-10-CM | POA: Insufficient documentation

## 2023-10-13 DIAGNOSIS — D649 Anemia, unspecified: Secondary | ICD-10-CM

## 2023-10-13 DIAGNOSIS — I4891 Unspecified atrial fibrillation: Secondary | ICD-10-CM | POA: Insufficient documentation

## 2023-10-13 DIAGNOSIS — D509 Iron deficiency anemia, unspecified: Secondary | ICD-10-CM | POA: Diagnosis not present

## 2023-10-13 DIAGNOSIS — Z803 Family history of malignant neoplasm of breast: Secondary | ICD-10-CM | POA: Insufficient documentation

## 2023-10-13 DIAGNOSIS — Z800 Family history of malignant neoplasm of digestive organs: Secondary | ICD-10-CM | POA: Insufficient documentation

## 2023-10-13 LAB — CMP (CANCER CENTER ONLY)
ALT: 19 U/L (ref 0–44)
AST: 26 U/L (ref 15–41)
Albumin: 3.7 g/dL (ref 3.5–5.0)
Alkaline Phosphatase: 73 U/L (ref 38–126)
Anion gap: 4 — ABNORMAL LOW (ref 5–15)
BUN: 11 mg/dL (ref 8–23)
CO2: 30 mmol/L (ref 22–32)
Calcium: 8.8 mg/dL — ABNORMAL LOW (ref 8.9–10.3)
Chloride: 106 mmol/L (ref 98–111)
Creatinine: 0.77 mg/dL (ref 0.44–1.00)
GFR, Estimated: >60 mL/min (ref >60)
Glucose, Bld: 94 mg/dL (ref 70–99)
Potassium: 3.9 mmol/L (ref 3.5–5.1)
Sodium: 140 mmol/L (ref 135–145)
Total Bilirubin: 0.4 mg/dL (ref 0.0–1.2)
Total Protein: 7.7 g/dL (ref 6.5–8.1)

## 2023-10-13 LAB — CBC WITH DIFFERENTIAL (CANCER CENTER ONLY)
Abs Immature Granulocytes: 0.02 K/uL (ref 0.00–0.07)
Basophils Absolute: 0.1 K/uL (ref 0.0–0.1)
Basophils Relative: 1 %
Eosinophils Absolute: 0.1 K/uL (ref 0.0–0.5)
Eosinophils Relative: 1 %
HCT: 32.1 % — ABNORMAL LOW (ref 36.0–46.0)
Hemoglobin: 9.9 g/dL — ABNORMAL LOW (ref 12.0–15.0)
Immature Granulocytes: 0 %
Lymphocytes Relative: 24 %
Lymphs Abs: 1.4 K/uL (ref 0.7–4.0)
MCH: 24.9 pg — ABNORMAL LOW (ref 26.0–34.0)
MCHC: 30.8 g/dL (ref 30.0–36.0)
MCV: 80.9 fL (ref 80.0–100.0)
Monocytes Absolute: 0.6 K/uL (ref 0.1–1.0)
Monocytes Relative: 10 %
Neutro Abs: 3.7 K/uL (ref 1.7–7.7)
Neutrophils Relative %: 64 %
Platelet Count: 298 K/uL (ref 150–400)
RBC: 3.97 MIL/uL (ref 3.87–5.11)
RDW: 14.8 % (ref 11.5–15.5)
WBC Count: 5.8 K/uL (ref 4.0–10.5)
nRBC: 0 % (ref 0.0–0.2)

## 2023-10-13 LAB — FOLATE: Folate: 8.9 ng/mL (ref >5.9)

## 2023-10-13 LAB — IRON AND IRON BINDING CAPACITY (CC-WL,HP ONLY)
Iron: 32 ug/dL (ref 28–170)
Saturation Ratios: 6 % — ABNORMAL LOW (ref 10.4–31.8)
TIBC: 507 ug/dL — ABNORMAL HIGH (ref 250–450)
UIBC: 475 ug/dL — ABNORMAL HIGH (ref 148–442)

## 2023-10-13 LAB — FERRITIN: Ferritin: 14 ng/mL (ref 11–307)

## 2023-10-13 LAB — VITAMIN B12: Vitamin B-12: 824 pg/mL (ref 180–914)

## 2023-10-14 ENCOUNTER — Telehealth: Payer: Self-pay

## 2023-10-14 NOTE — Telephone Encounter (Signed)
 Heather, patient will be scheduled as soon as possible.  Auth Submission: NO AUTH NEEDED Site of care: Site of care: [facility] Payer: [employer] medicare Medication & CPT/J Code(s) submitted: Venofer  (Iron  Sucrose) J1756 Diagnosis Code:  Route of submission (phone, fax, portal):  Phone # Fax # Auth type: Buy/Bill PB Units/visits requested: 200mg  x 5 doses Reference number:  Approval from: 10/14/23 to 02/14/24

## 2023-10-14 NOTE — Telephone Encounter (Signed)
 Called patient to relay message below as per Powell Lessen NP. Patient had no further questions at this time.   hi there. when you get a chance, no rush, will you let the patient know that her ferritin was slightly improved. it was 14. I have added orders for her to have IV iron at the [facility]. they will contact her to schedule these appointments. should be weekly for 5 weeks. the address is [address], [address] [id number]. she doesn't need B12 injections right now. her B12 did improve a lot. it is now 824. she should get a call from scheduling to recheck labs in 6 weeks and then to see her back for labs and follow up in 3 months. thank you! -Powell

## 2023-10-15 LAB — PROTEIN ELECTROPHORESIS, SERUM, WITH REFLEX
A/G Ratio: 0.8 (ref 0.7–1.7)
Albumin ELP: 3.3 g/dL (ref 2.9–4.4)
Alpha-1-Globulin: 0.2 g/dL (ref 0.0–0.4)
Alpha-2-Globulin: 0.6 g/dL (ref 0.4–1.0)
Beta Globulin: 1.3 g/dL (ref 0.7–1.3)
Gamma Globulin: 1.8 g/dL (ref 0.4–1.8)
Globulin, Total: 3.9 g/dL (ref 2.2–3.9)
Total Protein ELP: 7.2 g/dL (ref 6.0–8.5)

## 2023-10-19 ENCOUNTER — Ambulatory Visit: Payer: Self-pay | Admitting: Nurse Practitioner

## 2023-10-21 ENCOUNTER — Ambulatory Visit (INDEPENDENT_AMBULATORY_CARE_PROVIDER_SITE_OTHER)

## 2023-10-21 VITALS — BP 190/84 | HR 65 | Temp 97.7°F | Resp 18 | Ht 66.0 in

## 2023-10-21 DIAGNOSIS — D509 Iron deficiency anemia, unspecified: Secondary | ICD-10-CM

## 2023-10-21 DIAGNOSIS — D500 Iron deficiency anemia secondary to blood loss (chronic): Secondary | ICD-10-CM

## 2023-10-21 MED ORDER — ACETAMINOPHEN 325 MG PO TABS
650.0000 mg | ORAL_TABLET | Freq: Once | ORAL | Status: AC
  Administered 2023-10-21: 650 mg via ORAL
  Filled 2023-10-21: qty 2

## 2023-10-21 MED ORDER — IRON SUCROSE 20 MG/ML IV SOLN
200.0000 mg | Freq: Once | INTRAVENOUS | Status: AC
  Administered 2023-10-21: 200 mg via INTRAVENOUS
  Filled 2023-10-21: qty 10

## 2023-10-21 MED ORDER — DIPHENHYDRAMINE HCL 25 MG PO CAPS
25.0000 mg | ORAL_CAPSULE | Freq: Once | ORAL | Status: AC
  Administered 2023-10-21: 25 mg via ORAL
  Filled 2023-10-21: qty 1

## 2023-10-21 NOTE — Progress Notes (Signed)
 Diagnosis: Acute Anemia  Provider:  Mannam, Praveen MD  Procedure: IV Push  IV Type: Peripheral, IV Location: R Antecubital  Venofer  (Iron  Sucrose), Dose: 200 mg  Post Infusion IV Care: Observation period completed and Peripheral IV Discontinued  Discharge: Condition: Good, Destination: Home . AVS Provided  Performed by:  Rocky FORBES Sar, RN

## 2023-10-28 ENCOUNTER — Ambulatory Visit (INDEPENDENT_AMBULATORY_CARE_PROVIDER_SITE_OTHER)

## 2023-10-28 VITALS — BP 172/66 | HR 65 | Temp 98.2°F | Resp 20 | Ht 66.0 in

## 2023-10-28 DIAGNOSIS — D509 Iron deficiency anemia, unspecified: Secondary | ICD-10-CM

## 2023-10-28 DIAGNOSIS — D500 Iron deficiency anemia secondary to blood loss (chronic): Secondary | ICD-10-CM

## 2023-10-28 MED ORDER — IRON SUCROSE 20 MG/ML IV SOLN
200.0000 mg | Freq: Once | INTRAVENOUS | Status: AC
  Administered 2023-10-28: 200 mg via INTRAVENOUS
  Filled 2023-10-28: qty 10

## 2023-10-28 MED ORDER — ACETAMINOPHEN 325 MG PO TABS
650.0000 mg | ORAL_TABLET | Freq: Once | ORAL | Status: AC
  Administered 2023-10-28: 650 mg via ORAL
  Filled 2023-10-28: qty 2

## 2023-10-28 MED ORDER — DIPHENHYDRAMINE HCL 25 MG PO CAPS
25.0000 mg | ORAL_CAPSULE | Freq: Once | ORAL | Status: AC
  Administered 2023-10-28: 25 mg via ORAL
  Filled 2023-10-28: qty 1

## 2023-10-28 NOTE — Progress Notes (Signed)
 Diagnosis: Acute Anemia  Provider:  Mannam, Praveen MD  Procedure: IV Push  IV Type: Peripheral, IV Location: L Hand  Venofer  (Iron  Sucrose), Dose: 200 mg  Post Infusion IV Care: Patient declined observation and Peripheral IV Discontinued  Discharge: Condition: Good, Destination: Home . AVS Provided  Performed by:  Rocky FORBES Sar, RN

## 2023-10-30 ENCOUNTER — Telehealth: Payer: Self-pay | Admitting: Cardiology

## 2023-10-30 NOTE — Telephone Encounter (Signed)
  1. Has your device fired? no  2. Is you device beeping? no  3. Are you experiencing draining or swelling at device site? no  4. Are you calling to see if we received your device transmission? no  5. Have you passed out? No Pt said for the last 2 days she has woke up in a pain at her pacemaker from 12-4 in the morning. She said it is so painful she can hardly walk. She recently had an arm infusion and doesn't know if that has something to do with it.   Please route to Device Clinic Pool

## 2023-10-30 NOTE — Telephone Encounter (Addendum)
 Returned cal to Pt,  Per Pt she has had chest pain 2 times in the last few days.  Per review of [employer], Pt had 2 episodes of atrial fibrillation that correlate with chest discomfort.  Per Pt she recently had an infusion of Venofer  and wonders if this could have caused the Afib.  Per review of [facility] website more common side effect of Venofer  is fast heart rate.  Discussed with Jodie Passey PA.  Will have Pt contact oncology office to advise of potential side effect.  Will check [employer] website on Monday to ensure no further arrhythmias.  If further arrhythmias noted, would recommend Pt see Afib clinic.

## 2023-11-02 NOTE — Telephone Encounter (Signed)
 Bi[employer]ronik transmission reviewed  No further atrial events/ or episodes noted since 10/30/23 events. Called patient, says she is feeling GREAT today, no chest discomfort, no SOB and energy level is really good today.    Will continue to monitor.  Patient knows to call us  if she has any more of these symptoms in the future.

## 2023-11-04 ENCOUNTER — Ambulatory Visit

## 2023-11-04 VITALS — BP 168/76 | HR 63 | Temp 97.8°F | Resp 18 | Ht 66.0 in | Wt 298.0 lb

## 2023-11-04 DIAGNOSIS — D500 Iron deficiency anemia secondary to blood loss (chronic): Secondary | ICD-10-CM

## 2023-11-04 DIAGNOSIS — D509 Iron deficiency anemia, unspecified: Secondary | ICD-10-CM | POA: Diagnosis not present

## 2023-11-04 MED ORDER — DIPHENHYDRAMINE HCL 25 MG PO CAPS
25.0000 mg | ORAL_CAPSULE | Freq: Once | ORAL | Status: AC
  Administered 2023-11-04: 25 mg via ORAL
  Filled 2023-11-04: qty 1

## 2023-11-04 MED ORDER — IRON SUCROSE 20 MG/ML IV SOLN
200.0000 mg | Freq: Once | INTRAVENOUS | Status: AC
  Administered 2023-11-04: 200 mg via INTRAVENOUS
  Filled 2023-11-04: qty 10

## 2023-11-04 MED ORDER — ACETAMINOPHEN 325 MG PO TABS
650.0000 mg | ORAL_TABLET | Freq: Once | ORAL | Status: AC
  Administered 2023-11-04: 650 mg via ORAL
  Filled 2023-11-04: qty 2

## 2023-11-04 NOTE — Progress Notes (Signed)
 Diagnosis: Iron  Deficiency Anemia  Provider:  Praveen Mannam MD  Procedure: IV Push  IV Type: Peripheral, IV Location: R Antecubital  Venofer  (Iron  Sucrose), Dose: 200 mg  Post Infusion IV Care: Observation period completed  Discharge: Condition: Good, Destination: Home . AVS Provided  Performed by:  Maximiano JONELLE Pouch, LPN

## 2023-11-06 ENCOUNTER — Encounter: Payer: Self-pay | Admitting: Cardiology

## 2023-11-06 ENCOUNTER — Telehealth: Payer: Self-pay

## 2023-11-06 ENCOUNTER — Encounter (HOSPITAL_COMMUNITY): Payer: Self-pay | Admitting: Pediatrics

## 2023-11-06 NOTE — Telephone Encounter (Addendum)
 [facility] Pre-operative Risk Assessment     Request for surgical clearance:     Endoscopy Procedure  What type of surgery is being performed?     EGD/colonoscopy  When is this surgery scheduled?     11/16/23  What type of clearance is required ?   MEDICAL CLEARANCE  Are there any medications that need to be held prior to surgery and how long? N/A  Practice name and name of physician performing surgery?      [facility]  What is your office phone and fax number?      Phone- [phone]  Fax- [phone]  Anesthesia type (None, local, MAC, general) ?       MAC   Please route your response to [facility], RN     [facility] this pt is for 8/18 I did her pre call earlier and she expressed some concern about anesthesia. You can look at my full note of what she said but she said she hasn't had anesthesia in a few years but died the last times and they had to bring her back. She does now have a PM/ICD and yalls office sent a pharmacy clearance for her blood thinner but with her history anesthesia is wanting a medical cardiac clearance for her

## 2023-11-06 NOTE — Progress Notes (Signed)
 PERIOPERATIVE PRESCRIPTION FOR IMPLANTED CARDIAC DEVICE PROGRAMMING   Patient Information:  Patient: Autumn Young  MRN: [id number]  Date of Birth: 13-Jul-1945      Planned Procedure:  Endoscopy  Surgeon:  [facility]  Fax: [phone]  Date of Procedure:  11/16/2023    Device Information:   Clinic EP Physician:   Dr. Ole Holts Device Type:  Pacemaker Manufacturer and Phone #:  [employer]ron[employer]: [phone] Pacemaker Dependent?:  No Date of Last Device Check:  09/22/2023         Normal Device Function?:  Yes     Electrophysiologist's Recommendations:   Have magnet available. Provide continuous ECG monitoring when magnet is used or reprogramming is to be performed.  Procedure may interfere with device function.  Magnet should be placed over device during procedure.  Per Device Clinic Standing Orders, Almarie ONEIDA Shutter  11/06/2023 4:18 PM

## 2023-11-06 NOTE — Telephone Encounter (Signed)
 Device team,  Please review and provide recommendations for upcoming colonoscopy/EGD.  Thank you for your help.  Josefa HERO. Brittian Renaldo NP-C     11/06/2023, 3:29 PM [facility] [address] Office [phone] Fax [phone]

## 2023-11-06 NOTE — Progress Notes (Signed)
 Pre op call Autumn Young     PCP-Sagardia MD Cardiologist-Hochrein MD Pulmonologist-Desai MD   EKG-04/02/23 Echo-09/18/22 Cath-n/a Stress-n/a ICD/PM- ICD/PM Biotronik GLP1-n/a Blood Thinner-Eliquis  2 day hold last dose 8/15  History:HTN,Stroke,Asthma,COPD, ICD/PM. Patient last saw her cardiologist 07/22/23, had a blood thinner clearance on 09/18/23. Also sees pulm, last saw them 05/14/23 with f/u in 4 months. Patient says she did have a couple episodes arounnd 8/1 with her PM causing some chest pains. They interrogated and saw she was having afib during those episodes. She said they adjusted it and hasn't had any issues since, thought it could be related to iron  infusion as well. Upon asking her if any issues with anesthesia she said she has died the last few times with sedation she said she has not had anesthesia since the PM/ICD was placed due to being nervous about effects. She said the last time was about 3 years ago but wasn't w/in [facility] and was before the PM was placed.   Anesthesia Review- yes- with history needs medical cardiac clearance, GI office notified via epic chat 8/8

## 2023-11-09 ENCOUNTER — Telehealth: Payer: Self-pay | Admitting: Gastroenterology

## 2023-11-09 NOTE — Telephone Encounter (Signed)
 See 8/8 telephone encounter from Device team

## 2023-11-09 NOTE — Telephone Encounter (Signed)
 Procedure:Colonoscopy/Endoscopy Procedure date: 11/16/23 Procedure location: [facility] Arrival Time: 9:15 am Spoke with the patient Y/N: Yes Any prep concerns? No  Has the patient obtained the prep from the pharmacy ? Yes Do you have a care partner and transportation: Yes Any additional concerns? No

## 2023-11-11 ENCOUNTER — Ambulatory Visit

## 2023-11-11 VITALS — BP 169/83 | HR 67 | Temp 98.5°F | Resp 16 | Ht 66.0 in | Wt 297.0 lb

## 2023-11-11 DIAGNOSIS — D509 Iron deficiency anemia, unspecified: Secondary | ICD-10-CM

## 2023-11-11 DIAGNOSIS — D500 Iron deficiency anemia secondary to blood loss (chronic): Secondary | ICD-10-CM

## 2023-11-11 MED ORDER — IRON SUCROSE 20 MG/ML IV SOLN
200.0000 mg | Freq: Once | INTRAVENOUS | Status: AC
  Administered 2023-11-11 (×2): 200 mg via INTRAVENOUS
  Filled 2023-11-11: qty 10

## 2023-11-11 MED ORDER — ACETAMINOPHEN 325 MG PO TABS
650.0000 mg | ORAL_TABLET | Freq: Once | ORAL | Status: AC
  Administered 2023-11-11 (×2): 650 mg via ORAL
  Filled 2023-11-11: qty 2

## 2023-11-11 MED ORDER — DIPHENHYDRAMINE HCL 25 MG PO CAPS
25.0000 mg | ORAL_CAPSULE | Freq: Once | ORAL | Status: AC
Start: 2023-11-11 — End: 2023-11-11
  Administered 2023-11-11 (×2): 25 mg via ORAL
  Filled 2023-11-11: qty 1

## 2023-11-11 NOTE — Progress Notes (Signed)
 Diagnosis: Iron  Deficiency Anemia  Provider:  Mannam, Praveen MD  Procedure: IV Push  IV Type: Peripheral, IV Location: R Antecubital  Venofer  (Iron  Sucrose), Dose: 200 mg  Post Infusion IV Care: Observation period completed and Peripheral IV Discontinued  Discharge: Condition: Good, Destination: Home . AVS Provided  Performed by:  Trudy Lamarr LABOR, RN

## 2023-11-12 NOTE — Telephone Encounter (Signed)
 Patient with diagnosis of A Fib on Eliquis  for anticoagulation.    Procedure: Endoscopy Procedure  Date of procedure: 11/16/23   CHA2DS2-VASc Score = 7  This indicates a 11.2% annual risk of stroke. The patient's score is based upon: CHF History: 0 HTN History: 1 Diabetes History: 1 Stroke History: 2 Vascular Disease History: 0 Age Score: 2 Gender Score: 1     CrCl 84 ml/min using adj body weight Platelet count 298K  Patient has not had an Afib/aflutter ablation within the last 3 months or DCCV within the last 30 days  Per office protocol, patient can hold Eliquis  for 2 days prior to procedure.   **This guidance is not considered finalized until pre-operative APP has relayed final recommendations.**

## 2023-11-13 NOTE — Telephone Encounter (Signed)
   Patient Name: Autumn Young  DOB: 03-Feb-1946 MRN: [id number]  Primary Cardiologist: Lynwood Schilling, MD  Clinical pharmacists have reviewed the patient's past medical history, labs, and current medications as part of preoperative protocol coverage. The following recommendations have been made:   Patient with diagnosis of A Fib on Eliquis  for anticoagulation.     Procedure: Endoscopy Procedure  Date of procedure: 11/16/23     CHA2DS2-VASc Score = 7  This indicates a 11.2% annual risk of stroke. The patient's score is based upon: CHF History: 0 HTN History: 1 Diabetes History: 1 Stroke History: 2 Vascular Disease History: 0 Age Score: 2 Gender Score: 1    CrCl 84 ml/min using adj body weight Platelet count 298K   Patient has not had an Afib/aflutter ablation within the last 3 months or DCCV within the last 30 days   Per office protocol, patient can hold Eliquis  for 2 days prior to procedure.     I will route this recommendation to the requesting party via Epic fax function and remove from pre-op pool.  Please call with questions.  Lamarr Satterfield, NP 11/13/2023, 7:35 AM

## 2023-11-16 ENCOUNTER — Other Ambulatory Visit: Payer: Self-pay

## 2023-11-16 ENCOUNTER — Ambulatory Visit (HOSPITAL_BASED_OUTPATIENT_CLINIC_OR_DEPARTMENT_OTHER)

## 2023-11-16 ENCOUNTER — Ambulatory Visit (HOSPITAL_COMMUNITY)

## 2023-11-16 ENCOUNTER — Ambulatory Visit (HOSPITAL_COMMUNITY)
Admission: RE | Admit: 2023-11-16 | Discharge: 2023-11-16 | Disposition: A | Discharge status: Home / Self Care | Attending: Pediatrics | Admitting: Pediatrics

## 2023-11-16 ENCOUNTER — Encounter (HOSPITAL_COMMUNITY)
Admission: RE | Disposition: A | Discharge status: Home / Self Care | Payer: Self-pay | Source: Home / Self Care | Attending: Pediatrics

## 2023-11-16 DIAGNOSIS — Z87891 Personal history of nicotine dependence: Secondary | ICD-10-CM | POA: Diagnosis not present

## 2023-11-16 DIAGNOSIS — J4489 Other specified chronic obstructive pulmonary disease: Secondary | ICD-10-CM | POA: Insufficient documentation

## 2023-11-16 DIAGNOSIS — K5730 Diverticulosis of large intestine without perforation or abscess without bleeding: Secondary | ICD-10-CM | POA: Insufficient documentation

## 2023-11-16 DIAGNOSIS — K317 Polyp of stomach and duodenum: Secondary | ICD-10-CM

## 2023-11-16 DIAGNOSIS — K635 Polyp of colon: Secondary | ICD-10-CM

## 2023-11-16 DIAGNOSIS — Z950 Presence of cardiac pacemaker: Secondary | ICD-10-CM | POA: Diagnosis not present

## 2023-11-16 DIAGNOSIS — K219 Gastro-esophageal reflux disease without esophagitis: Secondary | ICD-10-CM | POA: Insufficient documentation

## 2023-11-16 DIAGNOSIS — D509 Iron deficiency anemia, unspecified: Secondary | ICD-10-CM | POA: Diagnosis not present

## 2023-11-16 DIAGNOSIS — Z860100 Personal history of colon polyps, unspecified: Secondary | ICD-10-CM | POA: Diagnosis not present

## 2023-11-16 DIAGNOSIS — I5032 Chronic diastolic (congestive) heart failure: Secondary | ICD-10-CM | POA: Diagnosis not present

## 2023-11-16 DIAGNOSIS — K449 Diaphragmatic hernia without obstruction or gangrene: Secondary | ICD-10-CM | POA: Diagnosis not present

## 2023-11-16 DIAGNOSIS — Z800 Family history of malignant neoplasm of digestive organs: Secondary | ICD-10-CM | POA: Insufficient documentation

## 2023-11-16 DIAGNOSIS — D123 Benign neoplasm of transverse colon: Secondary | ICD-10-CM | POA: Diagnosis not present

## 2023-11-16 DIAGNOSIS — Z860101 Personal history of adenomatous and serrated colon polyps: Secondary | ICD-10-CM

## 2023-11-16 DIAGNOSIS — K319 Disease of stomach and duodenum, unspecified: Secondary | ICD-10-CM | POA: Diagnosis not present

## 2023-11-16 DIAGNOSIS — G4730 Sleep apnea, unspecified: Secondary | ICD-10-CM | POA: Insufficient documentation

## 2023-11-16 DIAGNOSIS — K3189 Other diseases of stomach and duodenum: Secondary | ICD-10-CM | POA: Diagnosis not present

## 2023-11-16 DIAGNOSIS — K6389 Other specified diseases of intestine: Secondary | ICD-10-CM | POA: Diagnosis not present

## 2023-11-16 DIAGNOSIS — K649 Unspecified hemorrhoids: Secondary | ICD-10-CM | POA: Diagnosis not present

## 2023-11-16 DIAGNOSIS — I10 Essential (primary) hypertension: Secondary | ICD-10-CM | POA: Diagnosis not present

## 2023-11-16 DIAGNOSIS — I110 Hypertensive heart disease with heart failure: Secondary | ICD-10-CM | POA: Diagnosis not present

## 2023-11-16 DIAGNOSIS — K648 Other hemorrhoids: Secondary | ICD-10-CM | POA: Insufficient documentation

## 2023-11-16 DIAGNOSIS — E538 Deficiency of other specified B group vitamins: Secondary | ICD-10-CM

## 2023-11-16 DIAGNOSIS — R1084 Generalized abdominal pain: Secondary | ICD-10-CM

## 2023-11-16 SURGERY — COLONOSCOPY
Anesthesia: Monitor Anesthesia Care

## 2023-11-16 MED ORDER — PROPOFOL 500 MG/50ML IV EMUL
INTRAVENOUS | Status: DC | PRN
  Administered 2023-11-16: 180 ug/kg/min via INTRAVENOUS

## 2023-11-16 MED ORDER — SODIUM CHLORIDE 0.9 % IV SOLN: INTRAVENOUS | Status: DC

## 2023-11-16 MED ORDER — LIDOCAINE 2% (20 MG/ML) 5 ML SYRINGE
INTRAMUSCULAR | Status: DC | PRN
  Administered 2023-11-16: 40 mg via INTRAVENOUS

## 2023-11-16 MED ORDER — PROPOFOL 10 MG/ML IV BOLUS
INTRAVENOUS | Status: DC | PRN
  Administered 2023-11-16: 20 mg via INTRAVENOUS

## 2023-11-16 NOTE — Anesthesia Postprocedure Evaluation (Signed)
 Anesthesia Post Note  Patient: Autumn Young  Procedure(s) Performed: COLONOSCOPY EGD (ESOPHAGOGASTRODUODENOSCOPY) BIOPSY,GI POLYPECTOMY, INTESTINE     Patient location during evaluation: PACU Anesthesia Type: MAC Level of consciousness: awake and alert Pain management: pain level controlled Vital Signs Assessment: post-procedure vital signs reviewed and stable Respiratory status: spontaneous breathing, nonlabored ventilation, respiratory function stable and patient connected to nasal cannula oxygen  Cardiovascular status: stable and blood pressure returned to baseline Postop Assessment: no apparent nausea or vomiting Anesthetic complications: no   No notable events documented.  Last Vitals:  Vitals:   11/16/23 1212 11/16/23 1220  BP: (!) 155/80 (!) 187/80  Pulse: 80 84  Resp: 19 19  Temp: 36.5 C   SpO2: 97% 98%    Last Pain:  Vitals:   11/16/23 1212  TempSrc: Temporal  PainSc: 0-No pain                 Thom JONELLE Peoples

## 2023-11-16 NOTE — Op Note (Signed)
 [facility] Patient Name: Autumn Young Procedure Date: 11/16/2023 MRN: [id number] Attending MD: Inocente Hausen , MD, [id number] Date of Birth: July 01, 1945 CSN: [id number] Age: 78 Admit Type: Outpatient Procedure:                Upper GI endoscopy Indications:              Iron  deficiency anemia, Follow-up of                            gastro-esophageal reflux disease, Follow-up of                            hiatal hernia, Vitamin B12 deficiency Providers:                Inocente Hausen, MD, Collene Edu, RN, Curtistine Bishop,                            Technician Referring MD:              Medicines:                Monitored Anesthesia Care Complications:            No immediate complications. Estimated blood loss:                            Minimal. Estimated Blood Loss:     Estimated blood loss was minimal. Procedure:                Pre-Anesthesia Assessment:                           - Prior to the procedure, a History and Physical                            was performed, and patient medications and                            allergies were reviewed. The patient's tolerance of                            previous anesthesia was also reviewed. The risks                            and benefits of the procedure and the sedation                            options and risks were discussed with the patient.                            All questions were answered, and informed consent                            was obtained. Prior Anticoagulants: The patient has                            taken Eliquis  (apixaban ), last  dose was 3 days                            prior to procedure. ASA Grade Assessment: III - A                            patient with severe systemic disease. After                            reviewing the risks and benefits, the patient was                            deemed in satisfactory condition to undergo the                            procedure.                            After obtaining informed consent, the endoscope was                            passed under direct vision. Throughout the                            procedure, the patient's blood pressure, pulse, and                            oxygen  saturations were monitored continuously. The                            [id number]-[id number] ([id number]) Olympus endoscope was introduced                            through the mouth, and advanced to the second part                            of duodenum. The upper GI endoscopy was                            accomplished without difficulty. The patient                            tolerated the procedure well. Scope In: Scope Out: Findings:      The examined esophagus was normal.      The gastric body, gastric antrum, cardia (on retroflexion) and gastric       fundus (on retroflexion) were normal. Biopsies were taken with a cold       forceps for Helicobacter pylori testing.      Multiple diminutive sessile polyps were found in the gastric fundus and       in the gastric body. Biopsies were taken with a cold forceps for       histology.      A large hiatal hernia was present. Tissue within the hernia sac was       mildly erythematous but no Cameron's erosions were present.  The duodenal bulb and second portion of the duodenum were normal.       Biopsies for histology were taken with a cold forceps for evaluation of       celiac disease. Impression:               - Normal esophagus.                           - Normal gastric body, antrum, cardia and gastric                            fundus. Biopsied.                           - Multiple gastric polyps. Biopsied.                           - Large hiatal hernia.                           - Normal duodenal bulb and second portion of the                            duodenum. Biopsied. Moderate Sedation:      Not Applicable - Patient had care per Anesthesia. Recommendation:           - Await pathology  results.                           - Perform a colonoscopy today.                           - The findings and recommendations were discussed                            with the patient. Procedure Code(s):        --- Professional ---                           [phone], Esophagogastroduodenoscopy, flexible,                            transoral; with biopsy, single or multiple Diagnosis Code(s):        --- Professional ---                           K31.7, Polyp of stomach and duodenum                           D50.9, Iron  deficiency anemia, unspecified                           K21.9, Gastro-esophageal reflux disease without                            esophagitis  K44.9, Diaphragmatic hernia without obstruction or                            gangrene CPT copyright 2022 [employer]. All rights reserved. The codes documented in this report are preliminary and upon coder review may  be revised to meet current compliance requirements. Inocente Hausen, MD 11/16/2023 12:13:09 PM This report has been signed electronically. Number of Addenda: 0

## 2023-11-16 NOTE — Op Note (Signed)
 [facility] Patient Name: Autumn Young Procedure Date: 11/16/2023 MRN: [id number] Attending MD: Inocente Hausen , MD, [id number] Date of Birth: [birth year] CSN: [id number] Age: 78 Admit Type: Inpatient Procedure:                Colonoscopy Indications:              Iron  deficiency anemia, Family history of colon                            cancer in a first-degree relative before age 76                            years, Family history of colon cancer in multiple                            first-degree relatives Providers:                Inocente Hausen, MD, Collene Edu, RN, Curtistine Bishop,                            Technician Referring MD:              Medicines:                Monitored Anesthesia Care Complications:            No immediate complications. Estimated blood loss:                            Minimal. Estimated Blood Loss:     Estimated blood loss was minimal. Procedure:                Pre-Anesthesia Assessment:                           - Prior to the procedure, a History and Physical                            was performed, and patient medications and                            allergies were reviewed. The patient's tolerance of                            previous anesthesia was also reviewed. The risks                            and benefits of the procedure and the sedation                            options and risks were discussed with the patient.                            All questions were answered, and informed consent                            was obtained. Prior  Anticoagulants: The patient has                            taken Eliquis  (apixaban ), last dose was 3 days                            prior to procedure. ASA Grade Assessment: III - A                            patient with severe systemic disease. After                            reviewing the risks and benefits, the patient was                            deemed in satisfactory condition  to undergo the                            procedure.                           After obtaining informed consent, the colonoscope                            was passed under direct vision. Throughout the                            procedure, the patient's blood pressure, pulse, and                            oxygen  saturations were monitored continuously. The                            [id number] ([id number]) Olympus colonoscope was                            introduced through the anus and advanced to the 15                            cm into the ileum. The colonoscopy was performed                            without difficulty. The patient tolerated the                            procedure well. The quality of the bowel                            preparation was good except the sigmoid colon was                            fair. Stool impacted in diverticula unable to be  entirely lavaged. No mass lesions or large polyps                            seen. The terminal ileum, ileocecal valve,                            appendiceal orifice, and rectum were photographed. Scope In: 11:45:26 AM Scope Out: 12:04:46 PM Scope Withdrawal Time: 0 hours 15 minutes 28 seconds  Total Procedure Duration: 0 hours 19 minutes 20 seconds  Findings:      Hemorrhoids were found on perianal exam.      The digital rectal exam was normal. Pertinent negatives include normal       sphincter tone and no palpable rectal lesions.      Multiple medium-mouthed and small-mouthed diverticula were found in the       sigmoid colon, descending colon, transverse colon and ascending colon.       Some diverticula in the sigmoid colon were impacted with stool and could       not be entirely lavaged to evaluate those areas. No mass lesions or       large polyps, however, were seen.      A 5 mm polyp was found in the transverse colon. The polyp was sessile.       The polyp was removed with a cold snare.  Resection and retrieval were       complete.      The terminal ileum appeared normal. Biopsies were taken with a cold       forceps for histology.      Internal hemorrhoids were found during retroflexion. Impression:               - Hemorrhoids found on perianal exam.                           - Diverticulosis in the sigmoid colon, in the                            descending colon, in the transverse colon and in                            the ascending colon.                           - One 5 mm polyp in the transverse colon, removed                            with a cold snare. Resected and retrieved.                           - The examined portion of the ileum was normal.                            Biopsied.                           - Internal hemorrhoids. Moderate Sedation:      Not Applicable - Patient had care per Anesthesia. Recommendation:           -  Discharge patient to home (ambulatory).                           - Await pathology results from upper endoscopy and                            colonoscopy. If a source of iron  deficiency anemia                            is not identified on the studies, consider video                            capsule endoscopy to evaluate for small bowel                            lesion such as angioectasias which could contribute                            to iron  deficiency anemia.                           - Patient may resume Eliquis  on 11/17/2023.                           - The findings and recommendations were discussed                            with the patient.                           - Patient has a contact number available for                            emergencies. The signs and symptoms of potential                            delayed complications were discussed with the                            patient. Return to normal activities tomorrow.                            Written discharge instructions were provided to the                             patient. Procedure Code(s):        --- Professional ---                           [phone], Colonoscopy, flexible; with removal of                            tumor(s), polyp(s), or other lesion(s) by snare  technique                           45380, 59, Colonoscopy, flexible; with biopsy,                            single or multiple Diagnosis Code(s):        --- Professional ---                           D12.3, Benign neoplasm of transverse colon (hepatic                            flexure or splenic flexure)                           K64.8, Other hemorrhoids                           D50.9, Iron  deficiency anemia, unspecified                           Z80.0, Family history of malignant neoplasm of                            digestive organs                           K57.30, Diverticulosis of large intestine without                            perforation or abscess without bleeding CPT copyright 2022 American Medical Association. All rights reserved. The codes documented in this report are preliminary and upon coder review may  be revised to meet current compliance requirements. Inocente Hausen, MD 11/16/2023 12:18:49 PM This report has been signed electronically. Number of Addenda: 0

## 2023-11-16 NOTE — Discharge Instructions (Signed)

## 2023-11-16 NOTE — H&P (Signed)
 [facility] History and Physical   Primary Care Physician:  Purcell Emil Schanz, MD   Reason for Procedure:  Iron  deficiency anemia, vitamin B12 deficiency, GERD, hiatal hernia, history of colon tubular adenomas, family history of colorectal cancer  Plan:    Upper endoscopy and colonoscopy     HPI: Autumn Young is a 78 y.o. female undergoing upper endoscopy and colonoscopy for investigation of iron  deficiency anemia, vitamin B12 deficiency, GERD, hiatal hernia, history of colon tubular adenomas and family history of colorectal cancer.  Patient was seen in the office for iron  deficiency anemia and vitamin B12 deficiency.  No overt GI bleeding.  Patient does have a history of GERD and hiatal hernia raising possibility of Cameron's lesions.  CT imaging unremarkable with the exception of moderate hiatal hernia and colonic diverticulosis.  Last colonoscopy was performed in 2015 disclosing 2 tubular adenomas.  There is a family history of colorectal cancer in the patient's sister.  Patient takes Eliquis  with last dose 11/13/2023    Past Medical History:  Diagnosis Date   Asthma    COPD (chronic obstructive pulmonary disease) (HCC)    GERD (gastroesophageal reflux disease)    Hypertension    Stroke [facility])     Past Surgical History:  Procedure Laterality Date   PACEMAKER IMPLANT N/A 09/23/2021   Procedure: PACEMAKER IMPLANT;  Surgeon: Cindie Ole DASEN, MD;  Location: [facility];  Service: Cardiovascular;  Laterality: N/A;   TUBAL LIGATION      Prior to Admission medications   Medication Sig Start Date End Date Taking? Authorizing Provider  acetaminophen  (TYLENOL ) 500 MG tablet Take 500 mg by mouth every 6 (six) hours as needed (pain).   Yes [provider]  albuterol  (PROVENTIL  HFA) 108 (90 Base) MCG/ACT inhaler Inhale 2 puffs into the lungs every 6 (six) hours as needed for wheezing or shortness of breath. 08/14/23  Yes Hope Almarie ORN, NP   albuterol  (PROVENTIL ) (2.5 MG/3ML) 0.083% nebulizer solution Take 3 mLs (2.5 mg total) by nebulization every 6 (six) hours as needed for wheezing or shortness of breath. 08/14/23  Yes Hope Almarie ORN, NP  benzonatate  (TESSALON ) 200 MG capsule Take 1 capsule (200 mg total) by mouth 3 (three) times daily as needed for cough. 10/09/23  Yes Craig Palma R, PA-C  torsemide  (DEMADEX ) 20 MG tablet TAKE 1 TABLET BY MOUTH EVERY DAY 09/11/23  Yes Sagardia, Emil Schanz, MD  apixaban  (ELIQUIS ) 5 MG TABS tablet Take 1 tablet (5 mg total) by mouth 2 (two) times daily. 06/12/23   Lesia Ozell Barter, PA-C  ascorbic acid (VITAMIN C) 1000 MG tablet Take 1,000 mg by mouth daily. 05/14/18   [provider]  Budeson-Glycopyrrol-Formoterol  (BREZTRI  AEROSPHERE) 160-9-4.8 MCG/ACT AERO Inhale 2 puffs into the lungs in the morning and at bedtime. 05/14/23   Desai, Nikita S, MD  Cholecalciferol 25 MCG (1000 UT) tablet Take 1,000 Units by mouth in the morning.    [provider]  clotrimazole -betamethasone  (LOTRISONE ) cream Apply 1 Application topically 2 (two) times daily. 12/29/22   Purcell Emil Schanz, MD  Cyanocobalamin  (VITAMIN B-12 PO) Take 6 drops by mouth in the morning.    [provider]  guaifenesin  (ROBITUSSIN) 100 MG/5ML syrup Take 200 mg by mouth 3 (three) times daily as needed for cough.    [provider]  montelukast  (SINGULAIR ) 10 MG tablet Take 1 tablet (10 mg total) by mouth at bedtime. 04/07/23   Rai, Nydia POUR, MD  olmesartan  (BENICAR ) 40 MG  tablet Take 1 tablet (40 mg total) by mouth daily. 12/29/22   Purcell Emil Schanz, MD  Omega-3 Fatty Acids (OMEGA-3 PO) Take 1 capsule by mouth in the morning.    [provider]  pantoprazole  (PROTONIX ) 40 MG tablet Take 1 tablet (40 mg total) by mouth 2 (two) times daily before a meal. 09/18/23   Craig Alan SAUNDERS, PA-C  Red Yeast Rice Extract (RED YEAST RICE PO) Take 2 tablets by mouth daily.    [provider]    Current Facility-Administered Medications  Medication Dose Route Frequency Provider Last Rate Last Admin   0.9 %  sodium chloride  infusion   Intravenous Continuous Craig Alan R, PA-C        Allergies as of 09/18/2023 - Review Complete 09/18/2023  Allergen Reaction Noted   Shellfish allergy Anaphylaxis 02/21/2020   Zocor [simvastatin] Swelling 11/18/2014   Lasix [furosemide] Swelling 12/03/2016   Latex Rash 05/24/2015   Penicillins Rash 08/21/2014    Family History  Problem Relation Age of Onset   Cancer Mother        Lung, cervix, mouth   Hypertension Father    Cancer Sister        Colon   Diabetes Sister    Hypertension Sister    Heart disease Sister    Cancer Brother    Hypertension Brother    Liver disease Neg Hx    Esophageal cancer Neg Hx     Social History   Socioeconomic History   Marital status: Widowed    Spouse name: Not on file   Number of children: 3   Years of education: Not on file   Highest education level: Not on file  Occupational History   Occupation: retired  Tobacco Use   Smoking status: Former    Current packs/day: 0.00    Average packs/day: 3.0 packs/day for 2.0 years (6.0 ttl pk-yrs)    Types: Cigarettes    Start date: 48    Quit date: 1985    Years since quitting: 40.6   Smokeless tobacco: Never  Vaping Use   Vaping status: Never Used  Substance and Sexual Activity   Alcohol use: No   Drug use: No   Sexual activity: Not on file  Other Topics Concern   Not on file  Social History Narrative   Lives with a friend.  3 children with one deceased.  Right handed. Husband has passed away   Social Drivers of Health   Financial Resource Strain: Low Risk  (11/18/2022)   Overall Financial Resource Strain (CARDIA)    Difficulty of Paying Living Expenses: Not hard at all  Food Insecurity: No Food Insecurity (10/13/2023)   Hunger Vital Sign    Worried About Running Out of Food in the Last Year: Never true    Ran Out of Food in  the Last Year: Never true  Transportation Needs: No Transportation Needs (10/13/2023)   PRAPARE - Administrator, Civil Service (Medical): No    Lack of Transportation (Non-Medical): No  Physical Activity: Inactive (11/18/2022)   Exercise Vital Sign    Days of Exercise per Week: 0 days    Minutes of Exercise per Session: 0 min  Stress: No Stress Concern Present (11/18/2022)   [employer] of Occupational Health - Occupational Stress Questionnaire    Feeling of Stress : Not at all  Social Connections: Socially Integrated (04/01/2023)   Social Connection and Isolation Panel    Frequency of Communication with Friends  and Family: More than three times a week    Frequency of Social Gatherings with Friends and Family: More than three times a week    Attends Religious Services: More than 4 times per year    Active Member of [employer] or Organizations: Yes    Attends Engineer, structural: More than 4 times per year    Marital Status: Married  Catering manager Violence: Not At Risk (10/13/2023)   Humiliation, Afraid, Rape, and Kick questionnaire    Fear of Current or Ex-Partner: No    Emotionally Abused: No    Physically Abused: No    Sexually Abused: No    Review of Systems:  All other review of systems negative except as mentioned in the HPI.  Physical Exam: Vital signs BP (!) 199/76   Pulse 66   Temp 98.2 F (36.8 C) (Temporal)   Resp 14   Ht 5' 6 (1.676 m)   Wt 134.7 kg   SpO2 97%   BMI 47.93 kg/m   General:   Alert,  Well-developed, well-nourished, pleasant and cooperative in NAD Lungs:  Clear throughout to auscultation.   Heart:  Regular rate and rhythm; no murmurs, clicks, rubs,  or gallops. Abdomen:  Soft, nontender and nondistended. Normal bowel sounds.   Neuro/Psych:  Normal mood and affect. A and O x 3  Inocente Hausen, MD [facility]

## 2023-11-16 NOTE — Anesthesia Preprocedure Evaluation (Addendum)
 Anesthesia Evaluation  Patient identified by MRN, date of birth, ID band Patient awake    Reviewed: Allergy & Precautions, H&P , NPO status , Patient's Chart, lab work & pertinent test results  History of Anesthesia Complications Negative for: history of anesthetic complications  Airway Mallampati: II  TM Distance: >3 FB Neck ROM: Full    Dental no notable dental hx.    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   Neuro/Psych CVA  negative psych ROS   GI/Hepatic Neg liver ROS,GERD  ,,  Endo/Other  negative endocrine ROS    Renal/GU negative Renal ROS  negative genitourinary   Musculoskeletal negative musculoskeletal ROS (+)    Abdominal   Peds negative pediatric ROS (+)  Hematology  (+) Blood dyscrasia, anemia   Anesthesia Other Findings   Reproductive/Obstetrics negative OB ROS                              Anesthesia Physical Anesthesia Plan  ASA: 3  Anesthesia Plan: MAC   Post-op Pain Management:    Induction: Intravenous  PONV Risk Score and Plan: 2 and Propofol  infusion and Treatment may vary due to age or medical condition  Airway Management Planned: Natural Airway  Additional Equipment:   Intra-op Plan:   Post-operative Plan:   Informed Consent: I have reviewed the patients History and Physical, chart, labs and discussed the procedure including the risks, benefits and alternatives for the proposed anesthesia with the patient or authorized representative who has indicated his/her understanding and acceptance.     Dental advisory given  Plan Discussed with: CRNA  Anesthesia Plan Comments:          Anesthesia Quick Evaluation

## 2023-11-16 NOTE — Transfer of Care (Signed)
 Immediate Anesthesia Transfer of Care Note  Patient: Autumn Young  Procedure(s) Performed: COLONOSCOPY EGD (ESOPHAGOGASTRODUODENOSCOPY) BIOPSY,GI POLYPECTOMY, INTESTINE  Patient Location: Endoscopy Unit  Anesthesia Type:MAC  Level of Consciousness: drowsy and patient cooperative  Airway & Oxygen  Therapy: Patient Spontanous Breathing and Patient connected to face mask oxygen   Post-op Assessment: Report given to RN and Post -op Vital signs reviewed and stable  Post vital signs: Reviewed and stable  Last Vitals:  Vitals Value Taken Time  BP 155/80 11/16/23 12:12  Temp 36.5 C 11/16/23 12:12  Pulse 79 11/16/23 12:16  Resp 20 11/16/23 12:16  SpO2 98 % 11/16/23 12:16  Vitals shown include unfiled device data.  Last Pain:  Vitals:   11/16/23 1212  TempSrc: Temporal  PainSc: 0-No pain         Complications: No notable events documented.

## 2023-11-17 ENCOUNTER — Ambulatory Visit: Payer: Self-pay | Admitting: Pediatrics

## 2023-11-17 LAB — SURGICAL PATHOLOGY

## 2023-11-18 ENCOUNTER — Ambulatory Visit

## 2023-11-18 ENCOUNTER — Encounter (HOSPITAL_COMMUNITY): Payer: Self-pay | Admitting: Pediatrics

## 2023-11-18 VITALS — BP 176/93 | HR 68 | Temp 98.2°F | Resp 22 | Ht 66.0 in | Wt 297.5 lb

## 2023-11-18 DIAGNOSIS — D509 Iron deficiency anemia, unspecified: Secondary | ICD-10-CM

## 2023-11-18 DIAGNOSIS — D500 Iron deficiency anemia secondary to blood loss (chronic): Secondary | ICD-10-CM

## 2023-11-18 MED ORDER — IRON SUCROSE 20 MG/ML IV SOLN
200.0000 mg | Freq: Once | INTRAVENOUS | Status: AC
  Administered 2023-11-18: 200 mg via INTRAVENOUS
  Filled 2023-11-18: qty 10

## 2023-11-18 MED ORDER — ACETAMINOPHEN 325 MG PO TABS
650.0000 mg | ORAL_TABLET | Freq: Once | ORAL | Status: AC
Start: 2023-11-18 — End: 2023-11-18
  Administered 2023-11-18: 650 mg via ORAL
  Filled 2023-11-18: qty 2

## 2023-11-18 MED ORDER — DIPHENHYDRAMINE HCL 25 MG PO CAPS
25.0000 mg | ORAL_CAPSULE | Freq: Once | ORAL | Status: AC
Start: 2023-11-18 — End: 2023-11-18
  Administered 2023-11-18: 25 mg via ORAL
  Filled 2023-11-18: qty 1

## 2023-11-18 NOTE — Progress Notes (Signed)
 Diagnosis: Iron Deficiency Anemia  Provider:  Chilton Greathouse MD  Procedure: IV Push  IV Type: Peripheral, IV Location: R Antecubital  Venofer (Iron Sucrose), Dose: 200 mg  Post Infusion IV Care: Observation period completed and Peripheral IV Discontinued  Discharge: Condition: Good, Destination: Home . AVS Provided  Performed by:  Loney Hering, LPN

## 2023-11-19 ENCOUNTER — Ambulatory Visit: Payer: Medicare HMO

## 2023-11-19 VITALS — Ht 65.0 in | Wt 297.0 lb

## 2023-11-19 DIAGNOSIS — Z0000 Encounter for general adult medical examination without abnormal findings: Secondary | ICD-10-CM | POA: Diagnosis not present

## 2023-11-19 NOTE — Patient Instructions (Addendum)
 Autumn Young , Thank you for taking time out of your busy schedule to complete your Annual Wellness Visit with me. I enjoyed our conversation and look forward to speaking with you again next year. I, as well as your care team,  appreciate your ongoing commitment to your health goals. Please review the following plan we discussed and let me know if I can assist you in the future. Your Game plan/ To Do List     Follow up Visits: We will see or speak with you next year for your Next Medicare AWV with our clinical staff Have you seen your provider in the last 6 months (3 months if uncontrolled diabetes)? Yes.  Last office visit  Clinician Recommendations:  Aim for 30 minutes of exercise or brisk walking, 6-8 glasses of water, and 5 servings of fruits and vegetables each day. You are due for a shingles vaccine and can get this done at your local pharmacy.  You are also due for a Hep C screening and will have that done during your next office visit with Dr. Purcell.        This is a list of the screenings recommended for you:  Health Maintenance  Topic Date Due   Hepatitis C Screening  Never done   Zoster (Shingles) Vaccine (1 of 2) Never done   COVID-19 Vaccine (1 - 2024-25 season) Never done   Flu Shot  10/30/2023   Medicare Annual Wellness Visit  11/18/2024   DTaP/Tdap/Td vaccine (2 - Td or Tdap) 09/18/2027   Pneumococcal Vaccine for age over 31  Completed   DEXA scan (bone density measurement)  Completed   HPV Vaccine  Aged Out   Meningitis B Vaccine  Aged Out   Colon Cancer Screening  Discontinued    Advanced directives: (Copy Requested) Please bring a copy of your health care power of attorney and living will to the office to be added to your chart at your convenience. You can mail to [facility] [address] [place], [place] [address] or email to [email or web address] Advance Care Planning is important because it:  [x]  Makes sure you receive the medical care  that is consistent with your values, goals, and preferences  [x]  It provides guidance to your family and loved ones and reduces their decisional burden about whether or not they are making the right decisions based on your wishes.  Follow the link provided in your after visit summary or read over the paperwork we have mailed to you to help you started getting your Advance Directives in place. If you need assistance in completing these, please reach out to us  so that we can help you!  See attachments for Preventive Care and Fall Prevention Tips.

## 2023-11-19 NOTE — Progress Notes (Signed)
 Subjective:   Autumn Young is a 78 y.o. who presents for a Medicare Wellness preventive visit.  As a reminder, Annual Wellness Visits don't include a physical exam, and some assessments may be limited, especially if this visit is performed virtually. We may recommend an in-person follow-up visit with your provider if needed.  Visit Complete: Virtual I connected with  Autumn Young on 11/19/23 by a audio enabled telemedicine application and verified that I am speaking with the correct person using two identifiers.  Patient Location: Home  Provider Location: Office/Clinic  I discussed the limitations of evaluation and management by telemedicine. The patient expressed understanding and agreed to proceed.  Vital Signs: Because this visit was a virtual/telehealth visit, some criteria may be missing or patient reported. Any vitals not documented were not able to be obtained and vitals that have been documented are patient reported.  VideoDeclined- This patient declined Librarian, academic. Therefore the visit was completed with audio only.  Persons Participating in Visit: Patient.  AWV Questionnaire: No: Patient Medicare AWV questionnaire was not completed prior to this visit.  Cardiac Risk Factors include: advanced age (>5men, >41 women);hypertension;Other (see comment);dyslipidemia, Risk factor comments: OSA, COPD, Chronic heart failure     Objective:    Today's Vitals   11/19/23 1037 11/19/23 1040  Weight: 297 lb (134.7 kg)   Height: 5' 5 (1.651 m)   PainSc:  0-No pain   Body mass index is 49.42 kg/m.     11/19/2023   10:48 AM 11/16/2023   10:26 AM 10/13/2023   11:19 AM 04/01/2023    3:00 PM 03/31/2023    7:25 PM 11/18/2022   11:10 AM 11/04/2022    1:09 PM  Advanced Directives  Does Patient Have a Medical Advance Directive? Yes Yes Yes Yes No No No  Type of Estate agent of [place];Living will Healthcare Power of  [place];Living will  Living will;Healthcare Power of Attorney     Does patient want to make changes to medical advance directive?   No - Patient declined No - Patient declined     Copy of Healthcare Power of Attorney in Chart? No - copy requested No - copy requested  No - copy requested     Would patient like information on creating a medical advance directive?    No - Patient declined No - Patient declined No - Patient declined     Current Medications (verified) Outpatient Encounter Medications as of 11/19/2023  Medication Sig   acetaminophen  (TYLENOL ) 500 MG tablet Take 500 mg by mouth every 6 (six) hours as needed (pain).   ADVAIR  HFA 230-21 MCG/ACT inhaler Inhale 2 puffs into the lungs 2 (two) times daily.   albuterol  (PROVENTIL  HFA) 108 (90 Base) MCG/ACT inhaler Inhale 2 puffs into the lungs every 6 (six) hours as needed for wheezing or shortness of breath.   albuterol  (PROVENTIL ) (2.5 MG/3ML) 0.083% nebulizer solution Take 3 mLs (2.5 mg total) by nebulization every 6 (six) hours as needed for wheezing or shortness of breath.   apixaban  (ELIQUIS ) 5 MG TABS tablet Take 1 tablet (5 mg total) by mouth 2 (two) times daily.   ascorbic acid (VITAMIN C) 1000 MG tablet Take 1,000 mg by mouth daily.   benzonatate  (TESSALON ) 200 MG capsule Take 1 capsule (200 mg total) by mouth 3 (three) times daily as needed for cough.   Cholecalciferol 25 MCG (1000 UT) tablet Take 1,000 Units by mouth in the morning.   clotrimazole -betamethasone  (  LOTRISONE ) cream Apply 1 Application topically 2 (two) times daily.   Cyanocobalamin  (VITAMIN B-12 PO) Take 6 drops by mouth in the morning.   guaifenesin  (ROBITUSSIN) 100 MG/5ML syrup Take 200 mg by mouth 3 (three) times daily as needed for cough.   montelukast  (SINGULAIR ) 10 MG tablet Take 1 tablet (10 mg total) by mouth at bedtime.   olmesartan  (BENICAR ) 40 MG tablet Take 1 tablet (40 mg total) by mouth daily.   Omega-3 Fatty Acids (OMEGA-3 PO) Take 1 capsule by mouth  in the morning.   pantoprazole  (PROTONIX ) 40 MG tablet Take 1 tablet (40 mg total) by mouth 2 (two) times daily before a meal.   Red Yeast Rice Extract (RED YEAST RICE PO) Take 2 tablets by mouth daily.   torsemide  (DEMADEX ) 20 MG tablet TAKE 1 TABLET BY MOUTH EVERY DAY   Budeson-Glycopyrrol-Formoterol  (BREZTRI  AEROSPHERE) 160-9-4.8 MCG/ACT AERO Inhale 2 puffs into the lungs in the morning and at bedtime. (Patient not taking: Reported on 11/19/2023)   No facility-administered encounter medications on file as of 11/19/2023.    Allergies (verified) Shellfish allergy, Zocor [simvastatin], Lasix [furosemide], Latex, and Penicillins   History: Past Medical History:  Diagnosis Date   Asthma    COPD (chronic obstructive pulmonary disease) (HCC)    GERD (gastroesophageal reflux disease)    Hypertension    Stroke [facility])    Past Surgical History:  Procedure Laterality Date   BONE BIOPSY  11/16/2023   Procedure: Autumn Young;  Surgeon: Autumn Inocente HERO, MD;  Location: [facility];  Service: Gastroenterology;;   COLONOSCOPY N/A 11/16/2023   Procedure: COLONOSCOPY;  Surgeon: Autumn Inocente HERO, MD;  Location: [facility];  Service: Gastroenterology;  Laterality: N/A;   ESOPHAGOGASTRODUODENOSCOPY N/A 11/16/2023   Procedure: EGD (ESOPHAGOGASTRODUODENOSCOPY);  Surgeon: Autumn Inocente HERO, MD;  Location: [facility];  Service: Gastroenterology;  Laterality: N/A;   PACEMAKER IMPLANT N/A 09/23/2021   Procedure: PACEMAKER IMPLANT;  Surgeon: Autumn Ole DASEN, MD;  Location: [facility];  Service: Cardiovascular;  Laterality: N/A;   POLYPECTOMY  11/16/2023   Procedure: POLYPECTOMY, INTESTINE;  Surgeon: Autumn Inocente HERO, MD;  Location: [facility];  Service: Gastroenterology;;   TUBAL LIGATION     Family History  Problem Relation Age of Onset   Cancer Mother        Lung, cervix, mouth   Hypertension Father    Cancer Sister        Colon   Diabetes Sister    Hypertension Sister    Heart disease  Sister    Cancer Brother    Hypertension Brother    Liver disease Neg Hx    Esophageal cancer Neg Hx    Social History   Socioeconomic History   Marital status: Widowed    Spouse name: Not on file   Number of children: 3   Years of education: Not on file   Highest education level: Not on file  Occupational History   Occupation: retired  Tobacco Use   Smoking status: Former    Current packs/day: 0.00    Average packs/day: 3.0 packs/day for 2.0 years (6.0 ttl pk-yrs)    Types: Cigarettes    Start date: 38    Quit date: 1985    Years since quitting: 40.6   Smokeless tobacco: Never  Vaping Use   Vaping status: Never Used  Substance and Sexual Activity   Alcohol use: No   Drug use: No   Sexual activity: Not on file  Other Topics Concern  Not on file  Social History Narrative   Lives alone/2025  3 children with one deceased.  Right handed. Husband has passed away   Social Drivers of Health   Financial Resource Strain: Medium Risk (11/19/2023)   Overall Financial Resource Strain (CARDIA)    Difficulty of Paying Living Expenses: Somewhat hard  Food Insecurity: No Food Insecurity (11/19/2023)   Hunger Vital Sign    Worried About Running Out of Food in the Last Year: Never true    Ran Out of Food in the Last Year: Never true  Transportation Needs: No Transportation Needs (11/19/2023)   PRAPARE - Administrator, Civil Service (Medical): No    Lack of Transportation (Non-Medical): No  Physical Activity: Inactive (11/19/2023)   Exercise Vital Sign    Days of Exercise per Week: 0 days    Minutes of Exercise per Session: 0 min  Stress: No Stress Concern Present (11/19/2023)   [employer] of Occupational Health - Occupational Stress Questionnaire    Feeling of Stress: Only a little  Social Connections: Moderately Integrated (11/19/2023)   Social Connection and Isolation Panel    Frequency of Communication with Friends and Family: More than three times a week     Frequency of Social Gatherings with Friends and Family: More than three times a week    Attends Religious Services: More than 4 times per year    Active Member of [employer] or Organizations: Yes    Attends Banker Meetings: Never    Marital Status: Widowed    Tobacco Counseling Counseling given: Not Answered    Clinical Intake:  Pre-visit preparation completed: Yes  Pain : No/denies pain Pain Score: 0-No pain     BMI - recorded: 49.42 Nutritional Status: BMI > 30  Obese Nutritional Risks: None Diabetes: No  Lab Results  Component Value Date   HGBA1C 6.6 (H) 04/05/2023   HGBA1C 5.8 (H) 06/07/2021     How often do you need to have someone help you when you read instructions, pamphlets, or other written materials from your doctor or pharmacy?: 1 - Never  Interpreter Needed?: No  Information entered by :: Kenijah Benningfield, RMA   Activities of Daily Living     11/19/2023   10:40 AM 04/01/2023    3:00 PM  In your present state of health, do you have any difficulty performing the following activities:  Hearing? 0 0  Vision? 0 0  Difficulty concentrating or making decisions? 0 0  Walking or climbing stairs? 0   Dressing or bathing? 0   Doing errands, shopping? 0 0  Preparing Food and eating ? N   Using the Toilet? N   In the past six months, have you accidently leaked urine? Y   Do you have problems with loss of bowel control? N   Managing your Medications? N   Managing your Finances? N   Housekeeping or managing your Housekeeping? N     Patient Care Team: Purcell Emil Schanz, MD as PCP - General (Internal Medicine) Lavona Agent, MD as PCP - Cardiology (Cardiology) Autumn Ole DASEN, MD as PCP - Electrophysiology (Cardiology) Georjean Darice HERO, MD as Consulting Physician (Neurology)  I have updated your Care Teams any recent Medical Services you may have received from other providers in the past year.     Assessment:   This is a routine wellness  examination for Agency.  Hearing/Vision screen Hearing Screening - Comments:: Denies hearing difficulties   Vision Screening - Comments::  Wears eyeglasses/ [employer]   Goals Addressed               This Visit's Progress     Patient Stated (pt-stated)        Would like to eat better and lose some more weight/2025       Depression Screen     11/19/2023   10:53 AM 10/13/2023   11:22 AM 10/13/2023   11:20 AM 02/11/2023   11:00 AM 12/29/2022   10:42 AM 11/18/2022   11:09 AM 09/25/2022    3:42 PM  PHQ 2/9 Scores  PHQ - 2 Score 3 0 0 1 0 0 0  PHQ- 9 Score 3   5  0     Fall Risk     11/19/2023   10:49 AM 08/14/2023    9:32 AM 02/12/2023   10:10 AM 12/29/2022   10:42 AM 11/18/2022   11:12 AM  Fall Risk   Falls in the past year? 0 0 0 0 0  Number falls in past yr: 0   0 0  Injury with Fall? 0   0 0  Risk for fall due to : No Fall Risks   No Fall Risks No Fall Risks  Follow up Falls evaluation completed;Falls prevention discussed   Falls evaluation completed Falls prevention discussed    MEDICARE RISK AT HOME:  Medicare Risk at Home Any stairs in or around the home?: Yes (5 stairs in front of home.) If so, are there any without handrails?: Yes Home free of loose throw rugs in walkways, pet beds, electrical cords, etc?: Yes Adequate lighting in your home to reduce risk of falls?: Yes Life alert?: No Use of a cane, walker or w/c?: Yes (walker for distance/cane) Grab bars in the bathroom?: No Shower chair or bench in shower?: No Elevated toilet seat or a handicapped toilet?: No  TIMED UP AND GO:  Was the test performed?  No  Cognitive Function: Declined/Normal: No cognitive concerns noted by patient or family. Patient alert, oriented, able to answer questions appropriately and recall recent events. No signs of memory loss or confusion.        11/18/2022   11:13 AM  6CIT Screen  What Year? 0 points  What month? 0 points  What time? 0 points  Count back from 20 0  points  Months in reverse 0 points  Repeat phrase 0 points  Total Score 0 points    Immunizations Immunization History  Administered Date(s) Administered   Influenza-Unspecified 05/28/2015   Pneumococcal Conjugate-13 08/21/2014   Pneumococcal Polysaccharide-23 06/20/2012   Tdap 09/17/2017    Screening Tests Health Maintenance  Topic Date Due   Hepatitis C Screening  Never done   Zoster Vaccines- Shingrix (1 of 2) Never done   COVID-19 Vaccine (1 - 2024-25 season) Never done   INFLUENZA VACCINE  10/30/2023   Medicare Annual Wellness (AWV)  11/18/2024   DTaP/Tdap/Td (2 - Td or Tdap) 09/18/2027   Pneumococcal Vaccine: 50+ Years  Completed   DEXA SCAN  Completed   HPV VACCINES  Aged Out   Meningococcal B Vaccine  Aged Out   Colonoscopy  Discontinued    Health Maintenance  Health Maintenance Due  Topic Date Due   Hepatitis C Screening  Never done   Zoster Vaccines- Shingrix (1 of 2) Never done   COVID-19 Vaccine (1 - 2024-25 season) Never done   INFLUENZA VACCINE  10/30/2023   Health Maintenance Items Addressed: Labs Ordered: Hep C  screening due, See Nurse Notes at the end of this note  Additional Screening:  Vision Screening: Recommended annual ophthalmology exams for early detection of glaucoma and other disorders of the eye. Would you like a referral to an eye doctor? No    Dental Screening: Recommended annual dental exams for proper oral hygiene  Community Resource Referral / Chronic Care Management: CRR required this visit?  No   CCM required this visit?  No   Plan:    I have personally reviewed and noted the following in the patient's chart:   Medical and social history Use of alcohol, tobacco or illicit drugs  Current medications and supplements including opioid prescriptions. Patient is not currently taking opioid prescriptions. Functional ability and status Nutritional status Physical activity Advanced directives List of other  physicians Hospitalizations, surgeries, and ER visits in previous 12 months Vitals Screenings to include cognitive, depression, and falls Referrals and appointments  In addition, I have reviewed and discussed with patient certain preventive protocols, quality metrics, and best practice recommendations. A written personalized care plan for preventive services as well as general preventive health recommendations were provided to patient.   Dexter Signor L Hutson Luft, CMA   11/19/2023   After Visit Summary: (MyChart) Due to this being a telephonic visit, the after visit summary with patients personalized plan was offered to patient via MyChart   Notes: Patient is due for a Shingrix vaccine and a Hep C screening which can be done during her next office visit.  She had no other concerns to address today.

## 2023-11-30 ENCOUNTER — Other Ambulatory Visit: Payer: Self-pay | Admitting: Primary Care

## 2023-12-15 ENCOUNTER — Other Ambulatory Visit: Payer: Self-pay | Admitting: Emergency Medicine

## 2023-12-22 ENCOUNTER — Ambulatory Visit (INDEPENDENT_AMBULATORY_CARE_PROVIDER_SITE_OTHER): Payer: Medicare HMO

## 2023-12-22 DIAGNOSIS — I442 Atrioventricular block, complete: Secondary | ICD-10-CM | POA: Diagnosis not present

## 2023-12-23 ENCOUNTER — Ambulatory Visit: Payer: Self-pay | Admitting: Cardiology

## 2023-12-23 LAB — CUP PACEART REMOTE DEVICE CHECK
Battery Voltage: 80
Date Time Interrogation Session: 20250923095326
Implantable Lead Connection Status: 753985
Implantable Lead Connection Status: 753985
Implantable Lead Implant Date: 20230626
Implantable Lead Implant Date: 20230626
Implantable Lead Location: 753859
Implantable Lead Location: 753860
Implantable Lead Model: 377
Implantable Lead Model: 377
Implantable Lead Serial Number: 8000898701
Implantable Lead Serial Number: 8000923303
Implantable Pulse Generator Implant Date: 20230626
Pulse Gen Model: 407145
Pulse Gen Serial Number: 70433047

## 2023-12-23 NOTE — Progress Notes (Signed)
 Remote PPM Transmission

## 2023-12-30 NOTE — Telephone Encounter (Signed)
 Autumn Young has been re-scheduled for 10/17 at 8:30am and she has been made aware of her re-scheduled appointments.

## 2024-01-02 ENCOUNTER — Other Ambulatory Visit: Payer: Self-pay | Admitting: Emergency Medicine

## 2024-01-14 NOTE — Progress Notes (Signed)
 Patient Care Team: Purcell Emil Schanz, MD as PCP - General (Internal Medicine) Lavona Agent, MD as PCP - Cardiology (Cardiology) Cindie Ole DASEN, MD as PCP - Electrophysiology (Cardiology) Georjean Darice HERO, MD as Consulting Physician (Neurology)  Clinic Day:  01/15/2024  Referring physician: Purcell Emil Schanz, *  ASSESSMENT & PLAN:   Assessment & Plan: Anemia She was referred by her GI provider.  Most recent blood work done 09/18/2023.  Hgb 9.7, HCT 31.3, MCV 78.4, platelets 273.0.  B12 low/normal at 269.  Iron  31, sat ratio 6.4%, ferritin 7.5, transferrin 347.0, and  TIBC 485.8.  She is scheduled for colonoscopy on 11/16/2023. The patient had labs drawn prior to her visit. She is anemic. Her Hgb is 9.9, Hct 32.1, MCV 80.9. her iron  is improved at 32, TIBC 507, saturation ratio is 6%, and transferrin is 475. Her B12 level is improved significantly at 824.  Though still awaiting the results of ferritin, orders were sent to [facility] for her to have IV Venofer  200 mg weekly for 5 weeks.  She is scheduled for colonoscopy and endoscopy with her GI provider on 11/16/2023.  She should continue with oral liquid iron  supplement daily and B12 (selenium) daily.  She has had 5 treatments with IV iron .  Most recent infusion was 11/18/2023.  11/16/2023 - colonoscopy -  There were internal and external hemorrhoids found on the perianal exam.  She had diverticulosis in the sigmoid colon, the descending colon, the transverse colon, and the ascending colon.  She had a 5 mm polyp in the transverse colon which was removed.   11/16/2023 - endoscopy - She had multiple gastric polyps and a large hiatal hernia.  Biopsies yielded benign results.  - 01/15/2024 -CBC is now normal.  Hgb is 13.1 and HCT is 40.8.  Previously these have been 9.9 and 32.1 respectively.  Iron  panel and ferritin both normal.  B12 level is normal.  Continue oral iron  and B12 daily.  Repeat labs in 3 months and  treat worsening iron  deficiency as indicated.  Plan for labs and follow-up in 6 months, sooner if needed.   Plan Labs reviewed. - Hgb and HCT are normal at 13.1 and 40.8 respectively. - Iron  panel is normal. - Ferritin is good at 80. Continue oral iron  and B12 every day.  Recommend taking oral iron  with last use of vitamin C to help with absorption. Recheck labs in 3 months and treat iron  deficiency with IV iron  as indicated. Labs with follow-up in 6 months, sooner if needed.  The patient understands the plans discussed today and is in agreement with them.  She knows to contact our office if she develops concerns prior to her next appointment.  I provided 20 minutes of face-to-face time during this encounter and > 50% was spent counseling as documented under my assessment and plan.    Powell FORBES Lessen, NP  [facility] - A DEPT OF [facility] [address] [place] [address] Dept: [phone] Dept Fax: [phone]   No orders of the defined types were placed in this encounter.     CHIEF COMPLAINT:  CC: Iron  deficiency anemia due to chronic blood loss  Current Treatment:  oral iron  and b12 daily. IV iron  as needed.   INTERVAL HISTORY:  Autumn Young is here today for repeat clinical assessment.  She was initially seen by me on 10/13/2023.  She has had 5  treatments with IV iron .  Most recent infusion was 11/18/2023.  She had colonoscopy on 11/16/2023.  There were internal and external hemorrhoids found on the perianal exam.  She had diverticulosis in the sigmoid colon, the descending colon, the transverse colon, and the ascending colon.  She had a 5 mm polyp in the transverse colon which was removed.  She also had endoscopy on 11/16/2023.  She had multiple gastric polyps and a large hiatal hernia.  Biopsies yielded benign results.  She denies fevers or chills. She denies pain. Her appetite is good. Her weight has decreased 11  pounds over last 3 months. She has been watching her diet, eliminated carbohydrates, and increased vegetable intake.   I have reviewed the past medical history, past surgical history, social history and family history with the patient and they are unchanged from previous note.  ALLERGIES:  is allergic to shellfish allergy, zocor [simvastatin], lasix [furosemide], latex, and penicillins.  MEDICATIONS:  Current Outpatient Medications  Medication Sig Dispense Refill   acetaminophen  (TYLENOL ) 500 MG tablet Take 500 mg by mouth every 6 (six) hours as needed (pain).     ADVAIR  HFA 230-21 MCG/ACT inhaler Inhale 2 puffs into the lungs 2 (two) times daily.     albuterol  (PROVENTIL  HFA) 108 (90 Base) MCG/ACT inhaler Inhale 2 puffs into the lungs every 6 (six) hours as needed for wheezing or shortness of breath. 18 g 3   albuterol  (PROVENTIL ) (2.5 MG/3ML) 0.083% nebulizer solution Take 3 mLs (2.5 mg total) by nebulization every 6 (six) hours as needed for wheezing or shortness of breath. 75 mL 12   apixaban  (ELIQUIS ) 5 MG TABS tablet Take 1 tablet (5 mg total) by mouth 2 (two) times daily. 56 tablet    ascorbic acid (VITAMIN C) 1000 MG tablet Take 1,000 mg by mouth daily.     benzonatate  (TESSALON ) 200 MG capsule Take 1 capsule (200 mg total) by mouth 3 (three) times daily as needed for cough. 60 capsule 0   Budeson-Glycopyrrol-Formoterol  (BREZTRI  AEROSPHERE) 160-9-4.8 MCG/ACT AERO Inhale 2 puffs into the lungs in the morning and at bedtime. 1 each 11   Cholecalciferol 25 MCG (1000 UT) tablet Take 1,000 Units by mouth in the morning.     clotrimazole -betamethasone  (LOTRISONE ) cream Apply 1 Application topically 2 (two) times daily. 30 g 2   Cyanocobalamin  (VITAMIN B-12 PO) Take 6 drops by mouth in the morning.     guaifenesin  (ROBITUSSIN) 100 MG/5ML syrup Take 200 mg by mouth 3 (three) times daily as needed for cough.     montelukast  (SINGULAIR ) 10 MG tablet Take 1 tablet (10 mg total) by mouth at bedtime.  30 tablet 3   olmesartan  (BENICAR ) 40 MG tablet TAKE 1 TABLET BY MOUTH EVERY DAY 90 tablet 3   Omega-3 Fatty Acids (OMEGA-3 PO) Take 1 capsule by mouth in the morning.     pantoprazole  (PROTONIX ) 40 MG tablet TAKE 1 TABLET BY MOUTH EVERY DAY BEFORE BREAKFAST 90 tablet 1   Red Yeast Rice Extract (RED YEAST RICE PO) Take 2 tablets by mouth daily.     torsemide  (DEMADEX ) 20 MG tablet TAKE 1 TABLET BY MOUTH EVERY DAY 90 tablet 1   No current facility-administered medications for this visit.       REVIEW OF SYSTEMS:   Constitutional: Denies fevers, chills or abnormal weight loss. Some fatigue.  Eyes: Denies blurriness of vision Ears, nose, mouth, throat, and face: Denies mucositis or sore throat Respiratory: Denies cough, dyspnea or wheezes Cardiovascular: Denies  palpitation, chest discomfort or lower extremity swelling Gastrointestinal:  Denies nausea, heartburn or change in bowel habits. Will sometimes notice blood in her stool. Internal  and external hemorrhoids both found on colonoscopy.  Skin: Denies abnormal skin rashes Lymphatics: Denies new lymphadenopathy or easy bruising Neurological:Denies numbness, tingling or new weaknesses Behavioral/Psych: Mood is stable, no new changes  All other systems were reviewed with the patient and are negative.   VITALS:   Today's Vitals   01/15/24 0856 01/15/24 0900  BP: (!) 176/88 (!) 160/90  Pulse: 74   Resp: 18   Temp: 98.5 F (36.9 C)   TempSrc: Temporal   SpO2: 94%   Weight: 290 lb 1.6 oz (131.6 kg)   Height: 5' 5 (1.651 m)   PainSc:  0-No pain   Body mass index is 48.28 kg/m.   Wt Readings from Last 3 Encounters:  01/15/24 290 lb 1.6 oz (131.6 kg)  11/19/23 297 lb (134.7 kg)  11/18/23 297 lb 8 oz (134.9 kg)    Body mass index is 48.28 kg/m.  Performance status (ECOG): 1 - Symptomatic but completely ambulatory  PHYSICAL EXAM:   GENERAL:alert, no distress and comfortable SKIN: skin color, texture, turgor are normal,  no rashes or significant lesions EYES: normal, Conjunctiva are pink and non-injected, sclera clear OROPHARYNX:no exudate, no erythema and lips, buccal mucosa, and tongue normal  NECK: supple, thyroid  normal size, non-tender, without nodularity LYMPH:  no palpable lymphadenopathy in the cervical, axillary or inguinal LUNGS: clear to auscultation and percussion with normal breathing effort HEART: regular rate & rhythm and no murmurs and no lower extremity edema ABDOMEN:abdomen soft, non-tender and normal bowel sounds Musculoskeletal:no cyanosis of digits and no clubbing  NEURO: alert & oriented x 3 with fluent speech, no focal motor/sensory deficits  LABORATORY DATA:  I have reviewed the data as listed    Component Value Date/Time   NA 140 10/13/2023 1022   NA 143 07/22/2023 1049   K 3.9 10/13/2023 1022   CL 106 10/13/2023 1022   CO2 30 10/13/2023 1022   GLUCOSE 94 10/13/2023 1022   BUN 11 10/13/2023 1022   BUN 14 07/22/2023 1049   CREATININE 0.77 10/13/2023 1022   CALCIUM 8.8 (L) 10/13/2023 1022   PROT 7.7 10/13/2023 1022   ALBUMIN 3.7 10/13/2023 1022   AST 26 10/13/2023 1022   ALT 19 10/13/2023 1022   ALKPHOS 73 10/13/2023 1022   BILITOT 0.4 10/13/2023 1022   GFRNONAA >60 10/13/2023 1022   GFRAA >60 08/13/2017 0606    Lab Results  Component Value Date   SPEP Comment 10/13/2023    Lab Results  Component Value Date   WBC 7.0 01/15/2024   NEUTROABS 5.0 01/15/2024   HGB 13.1 01/15/2024   HCT 40.8 01/15/2024   MCV 86.3 01/15/2024   PLT 239 01/15/2024     Iron /TIBC/Ferritin/ %Sat    Component Value Date/Time   IRON  57 01/15/2024 0820   TIBC 417 01/15/2024 0820   FERRITIN 80 01/15/2024 0820   IRONPCTSAT 14 01/15/2024 0820   IRONPCTSAT 8 (L) 08/18/2023 1105

## 2024-01-14 NOTE — Assessment & Plan Note (Addendum)
 She was referred by her GI provider.  Most recent blood work done 09/18/2023.  Hgb 9.7, HCT 31.3, MCV 78.4, platelets 273.0.  B12 low/normal at 269.  Iron  31, sat ratio 6.4%, ferritin 7.5, transferrin 347.0, and  TIBC 485.8.  She is scheduled for colonoscopy on 11/16/2023. The patient had labs drawn prior to her visit. She is anemic. Her Hgb is 9.9, Hct 32.1, MCV 80.9. her iron  is improved at 32, TIBC 507, saturation ratio is 6%, and transferrin is 475. Her B12 level is improved significantly at 824.  Though still awaiting the results of ferritin, orders were sent to [facility] for her to have IV Venofer  200 mg weekly for 5 weeks.  She is scheduled for colonoscopy and endoscopy with her GI provider on 11/16/2023.  She should continue with oral liquid iron  supplement daily and B12 (selenium) daily.  She has had 5 treatments with IV iron .  Most recent infusion was 11/18/2023.  11/16/2023 - colonoscopy -  There were internal and external hemorrhoids found on the perianal exam.  She had diverticulosis in the sigmoid colon, the descending colon, the transverse colon, and the ascending colon.  She had a 5 mm polyp in the transverse colon which was removed.   11/16/2023 - endoscopy - She had multiple gastric polyps and a large hiatal hernia.  Biopsies yielded benign results.  - 01/15/2024 -CBC is now normal.  Hgb is 13.1 and HCT is 40.8.  Previously these have been 9.9 and 32.1 respectively.  Iron  panel and ferritin both normal.  B12 level is normal.  Continue oral iron  and B12 daily.  Repeat labs in 3 months and treat worsening iron  deficiency as indicated.  Plan for labs and follow-up in 6 months, sooner if needed.

## 2024-01-15 ENCOUNTER — Inpatient Hospital Stay: Attending: Nurse Practitioner

## 2024-01-15 ENCOUNTER — Inpatient Hospital Stay: Admitting: Nurse Practitioner

## 2024-01-15 ENCOUNTER — Ambulatory Visit: Admitting: Nurse Practitioner

## 2024-01-15 ENCOUNTER — Other Ambulatory Visit

## 2024-01-15 ENCOUNTER — Ambulatory Visit: Payer: Self-pay | Admitting: Nurse Practitioner

## 2024-01-15 VITALS — BP 160/90 | HR 74 | Temp 98.5°F | Resp 18 | Ht 65.0 in | Wt 290.1 lb

## 2024-01-15 DIAGNOSIS — Z860101 Personal history of adenomatous and serrated colon polyps: Secondary | ICD-10-CM | POA: Diagnosis not present

## 2024-01-15 DIAGNOSIS — Z7901 Long term (current) use of anticoagulants: Secondary | ICD-10-CM | POA: Diagnosis not present

## 2024-01-15 DIAGNOSIS — Z7951 Long term (current) use of inhaled steroids: Secondary | ICD-10-CM | POA: Diagnosis not present

## 2024-01-15 DIAGNOSIS — K648 Other hemorrhoids: Secondary | ICD-10-CM | POA: Diagnosis not present

## 2024-01-15 DIAGNOSIS — K449 Diaphragmatic hernia without obstruction or gangrene: Secondary | ICD-10-CM | POA: Insufficient documentation

## 2024-01-15 DIAGNOSIS — K5730 Diverticulosis of large intestine without perforation or abscess without bleeding: Secondary | ICD-10-CM | POA: Diagnosis not present

## 2024-01-15 DIAGNOSIS — K644 Residual hemorrhoidal skin tags: Secondary | ICD-10-CM | POA: Diagnosis not present

## 2024-01-15 DIAGNOSIS — K635 Polyp of colon: Secondary | ICD-10-CM | POA: Insufficient documentation

## 2024-01-15 DIAGNOSIS — D500 Iron deficiency anemia secondary to blood loss (chronic): Secondary | ICD-10-CM

## 2024-01-15 DIAGNOSIS — D509 Iron deficiency anemia, unspecified: Secondary | ICD-10-CM | POA: Insufficient documentation

## 2024-01-15 DIAGNOSIS — Z79899 Other long term (current) drug therapy: Secondary | ICD-10-CM | POA: Insufficient documentation

## 2024-01-15 LAB — CBC WITH DIFFERENTIAL (CANCER CENTER ONLY)
Abs Immature Granulocytes: 0.02 K/uL (ref 0.00–0.07)
Basophils Absolute: 0 K/uL (ref 0.0–0.1)
Basophils Relative: 1 %
Eosinophils Absolute: 0.1 K/uL (ref 0.0–0.5)
Eosinophils Relative: 1 %
HCT: 40.8 % (ref 36.0–46.0)
Hemoglobin: 13.1 g/dL (ref 12.0–15.0)
Immature Granulocytes: 0 %
Lymphocytes Relative: 19 %
Lymphs Abs: 1.3 K/uL (ref 0.7–4.0)
MCH: 27.7 pg (ref 26.0–34.0)
MCHC: 32.1 g/dL (ref 30.0–36.0)
MCV: 86.3 fL (ref 80.0–100.0)
Monocytes Absolute: 0.5 K/uL (ref 0.1–1.0)
Monocytes Relative: 8 %
Neutro Abs: 5 K/uL (ref 1.7–7.7)
Neutrophils Relative %: 71 %
Platelet Count: 239 K/uL (ref 150–400)
RBC: 4.73 MIL/uL (ref 3.87–5.11)
RDW: 14.9 % (ref 11.5–15.5)
WBC Count: 7 K/uL (ref 4.0–10.5)
nRBC: 0 % (ref 0.0–0.2)

## 2024-01-15 LAB — IRON AND IRON BINDING CAPACITY (CC-WL,HP ONLY)
Iron: 57 ug/dL (ref 28–170)
Saturation Ratios: 14 % (ref 10.4–31.8)
TIBC: 417 ug/dL (ref 250–450)
UIBC: 360 ug/dL (ref 148–442)

## 2024-01-15 LAB — VITAMIN B12: Vitamin B-12: 535 pg/mL (ref 180–914)

## 2024-01-15 LAB — FERRITIN: Ferritin: 80 ng/mL (ref 11–307)

## 2024-01-18 ENCOUNTER — Ambulatory Visit: Payer: Self-pay

## 2024-01-18 NOTE — Telephone Encounter (Signed)
 Patient states she is going to Urgent Care at this time    FYI Only or Action Required?: FYI only for provider.  Patient was last seen in primary care on 08/18/2023 by Purcell Emil Schanz, MD.  Called Nurse Triage reporting Cough.  Symptoms began several days ago.  Interventions attempted: Rest, hydration, or home remedies.  Symptoms are: gradually worsening.  Triage Disposition: See Physician Within 24 Hours  Patient/caregiver understands and will follow disposition?: Yes---Patient going to Urgent Care at this time                 Message from [facility] sent at 01/18/2024  3:28 PM EDT  Summary: Coughing   Reason for Triage: Patient states she has a cough and coughed all night off and on, will stop but then start back. No runny nose or any other symptoms Patient was coughing to the point she was choking while on with agent, agent made sure patient was okay but patient stated she would rather have a callback from nurse then wait to be transferred. Didn't refuse triage just didn't want to hold  Germani [phone]      Reason for Disposition  [1] Continuous (nonstop) coughing interferes with work or school AND [2] no improvement using cough treatment per Care Advice  Answer Assessment - Initial Assessment Questions 2 days ago coughing started Patient could barely speak a full sentence without coughing. She denies difficulty breathing or fever or coughing up blood. She states she cannot cough up any mucous but it does get in her throat when coughing. Patient is advised that with her continuous coughing and unable to speak long without coughing this RN recommends that she go be seen today either at Urgent Care or the Emergency Room. Patient sounded unwell during triage and kept coughing  Patient states she will go to Urgent Care at this time She is advised to call us  back if she needs to and go to the ER if she gets worse Patient verbalized  understanding.    1. ONSET: When did the cough begin?      2 days ago 2. SEVERITY: How bad is the cough today?      ---- 3. SPUTUM: Describe the color of your sputum (e.g., none, dry cough; clear, white, yellow, green)     unsure 4. HEMOPTYSIS: Are you coughing up any blood? If Yes, ask: How much? (e.g., flecks, streaks, tablespoons, etc.)     no 5. DIFFICULTY BREATHING: Are you having difficulty breathing? If Yes, ask: How bad is it? (e.g., mild, moderate, severe)      no 6. FEVER: Do you have a fever? If Yes, ask: What is your temperature, how was it measured, and when did it start?     no 7. CARDIAC HISTORY: Do you have any history of heart disease? (e.g., heart attack, congestive heart failure)      ----- 8. LUNG HISTORY: Do you have any history of lung disease?  (e.g., pulmonary embolus, asthma, emphysema)     asthma 9. PE RISK FACTORS: Do you have a history of blood clots? (or: recent major surgery, recent prolonged travel, bedridden)     no 10. OTHER SYMPTOMS: Do you have any other symptoms? (e.g., runny nose, wheezing, chest pain)       Congestion,  Protocols used: Cough - Acute Non-Productive-A-AH

## 2024-01-31 ENCOUNTER — Encounter: Payer: Self-pay | Admitting: Nurse Practitioner

## 2024-02-03 ENCOUNTER — Encounter: Payer: Self-pay | Admitting: Internal Medicine

## 2024-02-03 ENCOUNTER — Ambulatory Visit: Admitting: Internal Medicine

## 2024-02-03 VITALS — BP 134/98 | HR 70 | Ht 66.0 in | Wt 286.0 lb

## 2024-02-03 DIAGNOSIS — K219 Gastro-esophageal reflux disease without esophagitis: Secondary | ICD-10-CM

## 2024-02-03 DIAGNOSIS — J4550 Severe persistent asthma, uncomplicated: Secondary | ICD-10-CM | POA: Diagnosis not present

## 2024-02-03 DIAGNOSIS — G4733 Obstructive sleep apnea (adult) (pediatric): Secondary | ICD-10-CM

## 2024-02-03 DIAGNOSIS — I5022 Chronic systolic (congestive) heart failure: Secondary | ICD-10-CM

## 2024-02-03 MED ORDER — ALBUTEROL SULFATE HFA 108 (90 BASE) MCG/ACT IN AERS: 2.0000 | INHALATION_SPRAY | Freq: Four times a day (QID) | RESPIRATORY_TRACT | 11 refills | Status: AC | PRN

## 2024-02-03 MED ORDER — BREZTRI AEROSPHERE 160-9-4.8 MCG/ACT IN AERO: 2.0000 | INHALATION_SPRAY | Freq: Two times a day (BID) | RESPIRATORY_TRACT | 11 refills | Status: AC

## 2024-02-03 NOTE — Progress Notes (Signed)
 Autumn Young    [id number]    20-Feb-1946  Primary Care Physician:Sagardia, Emil Schanz, MD Date of Appointment: 02/03/2024 Established Patient Visit  Chief complaint:   Chief Complaint  Patient presents with   Asthma     HPI: Autumn Young is a 78 y.o. woman with childhood asthma and shortness of breath related to asthma, chronic hfpef, OSA on CPAP, GERD.  Interval Updates: Here for follow up for asthma.   Has been on breztri  and feels jittery on it. She uses it 2 puffs once a day and feels that this is enough.  She does not want to change the medicine right now.   No interval hospitalizations or ED visits.   She has had some iron  deficiency anemia and is getting iron  infusions now.   Wearing cpap some days, non consistently  Reflux well controlled on PPI.   Still on diuretic. She has been trying to move more, and cut back on food and count her calories. She has lost about 15-20 pounds.   I have reviewed the patient's family social and past medical history and updated as appropriate.   Past Medical History:  Diagnosis Date   Asthma    COPD (chronic obstructive pulmonary disease) (HCC)    GERD (gastroesophageal reflux disease)    Hypertension    Stroke [facility])     Past Surgical History:  Procedure Laterality Date   BONE BIOPSY  11/16/2023   Procedure: SHEROLYN BERKE;  Surgeon: Suzann Inocente HERO, MD;  Location: [facility];  Service: Gastroenterology;;   COLONOSCOPY N/A 11/16/2023   Procedure: COLONOSCOPY;  Surgeon: Suzann Inocente HERO, MD;  Location: [facility];  Service: Gastroenterology;  Laterality: N/A;   ESOPHAGOGASTRODUODENOSCOPY N/A 11/16/2023   Procedure: EGD (ESOPHAGOGASTRODUODENOSCOPY);  Surgeon: Suzann Inocente HERO, MD;  Location: [facility];  Service: Gastroenterology;  Laterality: N/A;   PACEMAKER IMPLANT N/A 09/23/2021   Procedure: PACEMAKER IMPLANT;  Surgeon: Cindie Ole DASEN, MD;  Location[facility] INVASIVE CV LAB;  Service: Cardiovascular;   Laterality: N/A;   POLYPECTOMY  11/16/2023   Procedure: POLYPECTOMY, INTESTINE;  Surgeon: Suzann Inocente HERO, MD;  Location: [facility];  Service: Gastroenterology;;   TUBAL LIGATION      Family History  Problem Relation Age of Onset   Cancer Mother        Lung, cervix, mouth   Hypertension Father    Cancer Sister        Colon   Diabetes Sister    Hypertension Sister    Heart disease Sister    Cancer Brother    Hypertension Brother    Liver disease Neg Hx    Esophageal cancer Neg Hx     Social History   Occupational History   Occupation: retired  Tobacco Use   Smoking status: Former    Current packs/day: 0.00    Average packs/day: 3.0 packs/day for 2.0 years (6.0 ttl pk-yrs)    Types: Cigarettes    Start date: 5    Quit date: 1985    Years since quitting: 40.8   Smokeless tobacco: Never  Vaping Use   Vaping status: Never Used  Substance and Sexual Activity   Alcohol use: No   Drug use: No   Sexual activity: Not on file     Physical Exam: Blood pressure (!) 134/98, pulse 70, height 5' 6 (1.676 m), weight 286 lb (129.7 kg), SpO2 94%.  Gen:      No acute distress, no distress, obese  Lungs:   Diminished, No increased respiratory effort, symmetric chest wall excursion, clear to auscultation bilaterally, no wheezes or crackles CV:         Regular rate and rhythm; no murmurs, rubs, or gallops.  No pedal edema   Data Reviewed: Imaging: I have personally reviewed the chest xray Jan 2025 - no acute pulmonary process.   PFTs:     Latest Ref Rng & Units 05/13/2023   12:30 PM  PFT Results  FVC-Pre L 1.63   FVC-Predicted Pre % 56   FVC-Post L 1.85   FVC-Predicted Post % 63   Pre FEV1/FVC % % 71   Post FEV1/FCV % % 77   FEV1-Pre L 1.17   FEV1-Predicted Pre % 53   FEV1-Post L 1.42   DLCO uncorrected ml/min/mmHg 13.81   DLCO UNC% % 68   DLCO corrected ml/min/mmHg 14.99   DLCO COR %Predicted % 74   DLVA Predicted % 110   TLC L 4.06   TLC % Predicted % 75    RV % Predicted % 87    I have personally reviewed the patient's PFTs and no airflow limitation but significant response to bronchodilator. Normal lung volumes and diffusion capacity.    Labs: Lab Results  Component Value Date   NA 140 10/13/2023   K 3.9 10/13/2023   CO2 30 10/13/2023   GLUCOSE 94 10/13/2023   BUN 11 10/13/2023   CREATININE 0.77 10/13/2023   CALCIUM 8.8 (L) 10/13/2023   GFR 80.80 09/18/2023   EGFR 72 07/22/2023   GFRNONAA >60 10/13/2023   Lab Results  Component Value Date   WBC 7.0 01/15/2024   HGB 13.1 01/15/2024   HCT 40.8 01/15/2024   MCV 86.3 01/15/2024   PLT 239 01/15/2024    Immunization status: Immunization History  Administered Date(s) Administered   Influenza-Unspecified 05/28/2015   Pneumococcal Conjugate-13 08/21/2014   Pneumococcal Polysaccharide-23 06/20/2012   Tdap 09/17/2017    External Records Personally Reviewed: oncology, cardiology, primary care  Assessment:  Moderate persistent asthma - controlled GERD, controlled on PPI OSA on CPAP,  Chronic HFpEF Obesity Body mass index is 46.16 kg/m.   Plan/Recommendations: Keep trying to wear your CPAP it will help your breathing and energy level.   For your asthma - continue the breztri  2 puffs once a day. If you are needing to use the albuterol  inhaler more frequently we might need to switch your inhalers to something with a higher dose inhaled steroid.   Continue albuterol  inhaler or nebulizer machine as needed.  continue the acid reflux medication.  continue singulair  for asthma and allerties.   Return to Care: Return in about 4 months (around 06/02/2024).   Verdon Gore, MD Pulmonary and Critical Care Medicine [facility] [phone]

## 2024-02-03 NOTE — Patient Instructions (Addendum)
 It was a pleasure to see you today!  Please schedule follow up with Dr Theodoro in 4 months.  If my schedule is not open yet, we will contact you with a reminder closer to that time. Please call [phone] if you haven't heard from us  a month before, and always call us  sooner if issues or concerns arise. You can also send us  a message through MyChart, but but aware that this is not to be used for urgent issues and it may take up to 5-7 days to receive a reply. Please be aware that you will likely be able to view your results before I have a chance to respond to them. Please give us  5 business days to respond to any non-urgent results.   Keep trying to wear your CPAP it will help your breathing and energy level.   For your asthma - continue the breztri  2 puffs once a day. If you are needing to use the albuterol  inhaler more frequently we might need to switch your inhalers to something with a higher dose inhaled steroid.   Continue albuterol  inhaler or nebulizer machine as needed.  continue the acid reflux medication.  continue singulair  for asthma and allerties.

## 2024-02-18 ENCOUNTER — Other Ambulatory Visit: Payer: Self-pay | Admitting: Nurse Practitioner

## 2024-02-18 DIAGNOSIS — D500 Iron deficiency anemia secondary to blood loss (chronic): Secondary | ICD-10-CM

## 2024-02-18 NOTE — Assessment & Plan Note (Deleted)
 She was referred by her GI provider.  Most recent blood work done 09/18/2023.  Hgb 9.7, HCT 31.3, MCV 78.4, platelets 273.0.  B12 low/normal at 269.  Iron  31, sat ratio 6.4%, ferritin 7.5, transferrin 347.0, and  TIBC 485.8.  She is scheduled for colonoscopy on 11/16/2023. The patient had labs drawn prior to her visit. She is anemic. Her Hgb is 9.9, Hct 32.1, MCV 80.9. her iron  is improved at 32, TIBC 507, saturation ratio is 6%, and transferrin is 475. Her B12 level is improved significantly at 824.  Though still awaiting the results of ferritin, orders were sent to [facility] for her to have IV Venofer  200 mg weekly for 5 weeks.  She is scheduled for colonoscopy and endoscopy with her GI provider on 11/16/2023.  She should continue with oral liquid iron  supplement daily and B12 (selenium) daily.  She has had 5 treatments with IV iron .  Most recent infusion was 11/18/2023.  11/16/2023 - colonoscopy -  There were internal and external hemorrhoids found on the perianal exam.  She had diverticulosis in the sigmoid colon, the descending colon, the transverse colon, and the ascending colon.  She had a 5 mm polyp in the transverse colon which was removed.   11/16/2023 - endoscopy - She had multiple gastric polyps and a large hiatal hernia.  Biopsies yielded benign results.  - 01/15/2024 -CBC is now normal.  Hgb is 13.1 and HCT is 40.8.  Previously these have been 9.9 and 32.1 respectively.  Iron  panel and ferritin both normal.  B12 level is normal.  Continue oral iron  and B12 daily.  Repeat labs in 3 months and treat worsening iron  deficiency as indicated.  Plan for labs and follow-up in 6 months, sooner if needed.

## 2024-02-18 NOTE — Progress Notes (Deleted)
 Patient Care Team: Purcell Emil Schanz, MD as PCP - General (Internal Medicine) Lavona Agent, MD as PCP - Cardiology (Cardiology) Cindie Ole DASEN, MD as PCP - Electrophysiology (Cardiology) Georjean Darice HERO, MD as Consulting Physician (Neurology)  Clinic Day:  02/18/2024  Referring physician: Purcell Emil Schanz, *  ASSESSMENT & PLAN:   Assessment & Plan: Anemia She was referred by her GI provider.  Most recent blood work done 09/18/2023.  Hgb 9.7, HCT 31.3, MCV 78.4, platelets 273.0.  B12 low/normal at 269.  Iron  31, sat ratio 6.4%, ferritin 7.5, transferrin 347.0, and  TIBC 485.8.  She is scheduled for colonoscopy on 11/16/2023. The patient had labs drawn prior to her visit. She is anemic. Her Hgb is 9.9, Hct 32.1, MCV 80.9. her iron  is improved at 32, TIBC 507, saturation ratio is 6%, and transferrin is 475. Her B12 level is improved significantly at 824.  Though still awaiting the results of ferritin, orders were sent to [facility] for her to have IV Venofer  200 mg weekly for 5 weeks.  She is scheduled for colonoscopy and endoscopy with her GI provider on 11/16/2023.  She should continue with oral liquid iron  supplement daily and B12 (selenium) daily.  She has had 5 treatments with IV iron .  Most recent infusion was 11/18/2023.  11/16/2023 - colonoscopy -  There were internal and external hemorrhoids found on the perianal exam.  She had diverticulosis in the sigmoid colon, the descending colon, the transverse colon, and the ascending colon.  She had a 5 mm polyp in the transverse colon which was removed.   11/16/2023 - endoscopy - She had multiple gastric polyps and a large hiatal hernia.  Biopsies yielded benign results.  - 01/15/2024 -CBC is now normal.  Hgb is 13.1 and HCT is 40.8.  Previously these have been 9.9 and 32.1 respectively.  Iron  panel and ferritin both normal.  B12 level is normal.  Continue oral iron  and B12 daily.  Repeat labs in 3 months and  treat worsening iron  deficiency as indicated.  Plan for labs and follow-up in 6 months, sooner if needed.    The patient understands the plans discussed today and is in agreement with them.  She knows to contact our office if she develops concerns prior to her next appointment.  I provided *** minutes of face-to-face time during this encounter and > 50% was spent counseling as documented under my assessment and plan.    Powell FORBES Lessen, NP  [facility] - A DEPT OF [facility] [address] [place] [address] Dept: [phone] Dept Fax: [phone]   No orders of the defined types were placed in this encounter.     CHIEF COMPLAINT:  CC: Iron  deficiency anemia due to chronic blood loss  Current Treatment: Oral iron  and B12 daily.  IV iron  as needed  INTERVAL HISTORY:  Autumn Young is here today for repeat clinical assessment.  She last saw me on 01/15/2024.  She denies fevers or chills. She denies pain. Her appetite is good. Her weight {Weight change:10426}.  I have reviewed the past medical history, past surgical history, social history and family history with the patient and they are unchanged from previous note.  ALLERGIES:  is allergic to shellfish allergy, zocor [simvastatin], lasix [furosemide], latex, and penicillins.  MEDICATIONS:  Current Outpatient Medications  Medication Sig Dispense Refill   acetaminophen  (TYLENOL ) 500 MG tablet Take 500 mg  by mouth every 6 (six) hours as needed (pain).     albuterol  (PROVENTIL  HFA) 108 (90 Base) MCG/ACT inhaler Inhale 2 puffs into the lungs every 6 (six) hours as needed for wheezing or shortness of breath. 25.5 each 11   albuterol  (PROVENTIL ) (2.5 MG/3ML) 0.083% nebulizer solution Take 3 mLs (2.5 mg total) by nebulization every 6 (six) hours as needed for wheezing or shortness of breath. 75 mL 12   apixaban  (ELIQUIS ) 5 MG TABS tablet Take 1 tablet (5 mg total) by mouth 2  (two) times daily. 56 tablet    ascorbic acid (VITAMIN C) 1000 MG tablet Take 1,000 mg by mouth daily.     benzonatate  (TESSALON ) 200 MG capsule Take 1 capsule (200 mg total) by mouth 3 (three) times daily as needed for cough. 60 capsule 0   budesonide -glycopyrrolate-formoterol  (BREZTRI  AEROSPHERE) 160-9-4.8 MCG/ACT AERO inhaler Inhale 2 puffs into the lungs in the morning and at bedtime. 10.7 each 11   Cholecalciferol 25 MCG (1000 UT) tablet Take 1,000 Units by mouth in the morning.     clotrimazole -betamethasone  (LOTRISONE ) cream Apply 1 Application topically 2 (two) times daily. 30 g 2   Cyanocobalamin  (VITAMIN B-12 PO) Take 6 drops by mouth in the morning.     guaifenesin  (ROBITUSSIN) 100 MG/5ML syrup Take 200 mg by mouth 3 (three) times daily as needed for cough.     montelukast  (SINGULAIR ) 10 MG tablet Take 1 tablet (10 mg total) by mouth at bedtime. 30 tablet 3   olmesartan  (BENICAR ) 40 MG tablet TAKE 1 TABLET BY MOUTH EVERY DAY 90 tablet 3   Omega-3 Fatty Acids (OMEGA-3 PO) Take 1 capsule by mouth in the morning.     pantoprazole  (PROTONIX ) 40 MG tablet TAKE 1 TABLET BY MOUTH EVERY DAY BEFORE BREAKFAST 90 tablet 1   Red Yeast Rice Extract (RED YEAST RICE PO) Take 2 tablets by mouth daily.     torsemide  (DEMADEX ) 20 MG tablet TAKE 1 TABLET BY MOUTH EVERY DAY 90 tablet 1   No current facility-administered medications for this visit.    HISTORY OF PRESENT ILLNESS:   Oncology History   No history exists.      REVIEW OF SYSTEMS:   Constitutional: Denies fevers, chills or abnormal weight loss Eyes: Denies blurriness of vision Ears, nose, mouth, throat, and face: Denies mucositis or sore throat Respiratory: Denies cough, dyspnea or wheezes Cardiovascular: Denies palpitation, chest discomfort or lower extremity swelling Gastrointestinal:  Denies nausea, heartburn or change in bowel habits Skin: Denies abnormal skin rashes Lymphatics: Denies new lymphadenopathy or easy  bruising Neurological:Denies numbness, tingling or new weaknesses Behavioral/Psych: Mood is stable, no new changes  All other systems were reviewed with the patient and are negative.   VITALS:  There were no vitals taken for this visit.  Wt Readings from Last 3 Encounters:  02/03/24 286 lb (129.7 kg)  01/15/24 290 lb 1.6 oz (131.6 kg)  11/19/23 297 lb (134.7 kg)    There is no height or weight on file to calculate BMI.  Performance status (ECOG): {CHL ONC [id number]  PHYSICAL EXAM:   GENERAL:alert, no distress and comfortable SKIN: skin color, texture, turgor are normal, no rashes or significant lesions EYES: normal, Conjunctiva are pink and non-injected, sclera clear OROPHARYNX:no exudate, no erythema and lips, buccal mucosa, and tongue normal  NECK: supple, thyroid  normal size, non-tender, without nodularity LYMPH:  no palpable lymphadenopathy in the cervical, axillary or inguinal LUNGS: clear to auscultation and percussion with normal breathing effort HEART:  regular rate & rhythm and no murmurs and no lower extremity edema ABDOMEN:abdomen soft, non-tender and normal bowel sounds Musculoskeletal:no cyanosis of digits and no clubbing  NEURO: alert & oriented x 3 with fluent speech, no focal motor/sensory deficits  LABORATORY DATA:  I have reviewed the data as listed    Component Value Date/Time   NA 140 10/13/2023 1022   NA 143 07/22/2023 1049   K 3.9 10/13/2023 1022   CL 106 10/13/2023 1022   CO2 30 10/13/2023 1022   GLUCOSE 94 10/13/2023 1022   BUN 11 10/13/2023 1022   BUN 14 07/22/2023 1049   CREATININE 0.77 10/13/2023 1022   CALCIUM 8.8 (L) 10/13/2023 1022   PROT 7.7 10/13/2023 1022   ALBUMIN 3.7 10/13/2023 1022   AST 26 10/13/2023 1022   ALT 19 10/13/2023 1022   ALKPHOS 73 10/13/2023 1022   BILITOT 0.4 10/13/2023 1022   GFRNONAA >60 10/13/2023 1022   GFRAA >60 08/13/2017 0606    Lab Results  Component Value Date   SPEP Comment 10/13/2023    Lab  Results  Component Value Date   WBC 7.0 01/15/2024   NEUTROABS 5.0 01/15/2024   HGB 13.1 01/15/2024   HCT 40.8 01/15/2024   MCV 86.3 01/15/2024   PLT 239 01/15/2024      Chemistry      Component Value Date/Time   NA 140 10/13/2023 1022   NA 143 07/22/2023 1049   K 3.9 10/13/2023 1022   CL 106 10/13/2023 1022   CO2 30 10/13/2023 1022   BUN 11 10/13/2023 1022   BUN 14 07/22/2023 1049   CREATININE 0.77 10/13/2023 1022      Component Value Date/Time   CALCIUM 8.8 (L) 10/13/2023 1022   ALKPHOS 73 10/13/2023 1022   AST 26 10/13/2023 1022   ALT 19 10/13/2023 1022   BILITOT 0.4 10/13/2023 1022       RADIOGRAPHIC STUDIES: I have personally reviewed the radiological images as listed and agreed with the findings in the report. No results found.

## 2024-02-19 ENCOUNTER — Inpatient Hospital Stay

## 2024-02-19 ENCOUNTER — Inpatient Hospital Stay: Admitting: Nurse Practitioner

## 2024-02-19 DIAGNOSIS — D500 Iron deficiency anemia secondary to blood loss (chronic): Secondary | ICD-10-CM

## 2024-03-02 ENCOUNTER — Other Ambulatory Visit: Payer: Self-pay | Admitting: Emergency Medicine

## 2024-03-02 DIAGNOSIS — R600 Localized edema: Secondary | ICD-10-CM

## 2024-03-22 ENCOUNTER — Ambulatory Visit: Payer: Medicare HMO

## 2024-03-22 DIAGNOSIS — I442 Atrioventricular block, complete: Secondary | ICD-10-CM | POA: Diagnosis not present

## 2024-03-22 LAB — CUP PACEART REMOTE DEVICE CHECK
Date Time Interrogation Session: 20251223145219
Implantable Lead Connection Status: 753985
Implantable Lead Connection Status: 753985
Implantable Lead Implant Date: 20230626
Implantable Lead Implant Date: 20230626
Implantable Lead Location: 753859
Implantable Lead Location: 753860
Implantable Lead Model: 377
Implantable Lead Model: 377
Implantable Lead Serial Number: 8000898701
Implantable Lead Serial Number: 8000923303
Implantable Pulse Generator Implant Date: 20230626
Pulse Gen Model: 407145
Pulse Gen Serial Number: 70433047

## 2024-03-23 NOTE — Progress Notes (Signed)
 Remote PPM Transmission

## 2024-03-25 ENCOUNTER — Ambulatory Visit: Payer: Self-pay | Admitting: Cardiology

## 2024-04-08 ENCOUNTER — Other Ambulatory Visit: Payer: Self-pay | Admitting: Physician Assistant

## 2024-04-08 ENCOUNTER — Inpatient Hospital Stay: Attending: Nurse Practitioner

## 2024-04-08 DIAGNOSIS — Z79899 Other long term (current) drug therapy: Secondary | ICD-10-CM | POA: Diagnosis not present

## 2024-04-08 DIAGNOSIS — E538 Deficiency of other specified B group vitamins: Secondary | ICD-10-CM | POA: Diagnosis not present

## 2024-04-08 DIAGNOSIS — D509 Iron deficiency anemia, unspecified: Secondary | ICD-10-CM | POA: Diagnosis present

## 2024-04-08 DIAGNOSIS — D500 Iron deficiency anemia secondary to blood loss (chronic): Secondary | ICD-10-CM

## 2024-04-08 LAB — CBC WITH DIFFERENTIAL (CANCER CENTER ONLY)
Abs Immature Granulocytes: 0.01 K/uL (ref 0.00–0.07)
Basophils Absolute: 0.1 K/uL (ref 0.0–0.1)
Basophils Relative: 1 %
Eosinophils Absolute: 0.1 K/uL (ref 0.0–0.5)
Eosinophils Relative: 2 %
HCT: 38.9 % (ref 36.0–46.0)
Hemoglobin: 12.2 g/dL (ref 12.0–15.0)
Immature Granulocytes: 0 %
Lymphocytes Relative: 27 %
Lymphs Abs: 1.4 K/uL (ref 0.7–4.0)
MCH: 28.7 pg (ref 26.0–34.0)
MCHC: 31.4 g/dL (ref 30.0–36.0)
MCV: 91.5 fL (ref 80.0–100.0)
Monocytes Absolute: 0.5 K/uL (ref 0.1–1.0)
Monocytes Relative: 9 %
Neutro Abs: 3.2 K/uL (ref 1.7–7.7)
Neutrophils Relative %: 61 %
Platelet Count: 208 K/uL (ref 150–400)
RBC: 4.25 MIL/uL (ref 3.87–5.11)
RDW: 12.8 % (ref 11.5–15.5)
WBC Count: 5.2 K/uL (ref 4.0–10.5)
nRBC: 0 % (ref 0.0–0.2)

## 2024-04-08 LAB — IRON AND IRON BINDING CAPACITY (CC-WL,HP ONLY)
Iron: 55 ug/dL (ref 28–170)
Saturation Ratios: 14 % (ref 10.4–31.8)
TIBC: 386 ug/dL (ref 250–450)
UIBC: 331 ug/dL

## 2024-04-08 LAB — VITAMIN B12: Vitamin B-12: 411 pg/mL (ref 180–914)

## 2024-04-08 LAB — FERRITIN: Ferritin: 55 ng/mL (ref 11–307)

## 2024-04-12 ENCOUNTER — Ambulatory Visit: Payer: Self-pay | Admitting: Nurse Practitioner

## 2024-04-27 ENCOUNTER — Encounter: Payer: Self-pay | Admitting: Emergency Medicine

## 2024-04-27 ENCOUNTER — Ambulatory Visit: Admitting: Emergency Medicine

## 2024-04-27 ENCOUNTER — Ambulatory Visit: Payer: Self-pay

## 2024-04-27 VITALS — BP 180/100 | HR 68 | Temp 98.1°F | Ht 66.0 in | Wt 291.0 lb

## 2024-04-27 DIAGNOSIS — B349 Viral infection, unspecified: Secondary | ICD-10-CM | POA: Diagnosis not present

## 2024-04-27 DIAGNOSIS — R6889 Other general symptoms and signs: Secondary | ICD-10-CM

## 2024-04-27 LAB — POCT INFLUENZA A/B
Influenza A, POC: NEGATIVE
Influenza B, POC: NEGATIVE

## 2024-04-27 LAB — POC COVID19 BINAXNOW: SARS Coronavirus 2 Ag: NEGATIVE

## 2024-04-27 NOTE — Assessment & Plan Note (Signed)
 Symptom management discussed Advised to rest and stay well-hydrated Take Tylenol  and/or Advil as needed ED precautions given

## 2024-04-27 NOTE — Assessment & Plan Note (Signed)
 Clinically stable.  No red flag signs or symptoms. Negative COVID and flu tests Symptom management discussed. Advised to rest and stay well-hydrated ED precautions given Advised to contact the office if no better or worse during the next several days

## 2024-04-27 NOTE — Patient Instructions (Signed)
 Viral Illness, Adult Viruses are tiny germs that can get into a person's body and cause illness. There are many different types of viruses. And they cause many types of illness. Viral illnesses can range from mild to severe. They can affect various parts of the body. Short-term conditions that are caused by a virus include colds and flu (influenza) and stomach viruses. Long-term conditions that are caused by a virus include herpes, shingles, and human immunodeficiency virus (HIV) infection. A few viruses have been linked to certain cancers. What are the causes? Many types of viruses can cause illness. Viruses get into cells in your body, multiply, and cause the infected cells to work differently or die. When these cells die, they release more of the virus. When this happens, you get symptoms of the illness and the virus spreads to other cells. If the virus takes over how the cell works, it can cause the cell to divide and grow out of control. This happens when a virus causes cancer. Different viruses get into the body in different ways. You can get a virus by: Swallowing food or water that has come in contact with the virus. Breathing in droplets that have been coughed or sneezed into the air by an infected person. Touching a surface that has the virus on it and then touching your eyes, nose, or mouth. Being bitten by an insect or animal that carries the virus. Having sexual contact with a person who is infected with the virus. Being exposed to blood or fluids that contain the virus, either through an open cut or during a transfusion. If a virus enters your body, your body's disease-fighting system (immune system) will try to fight the virus. You may be at higher risk for a viral illness if your immune system is weak. What are the signs or symptoms? Symptoms depend on the type of virus and the location of the cells that it gets into. Symptoms can include: For cold and flu  viruses: Fever. Headache. Sore throat. Muscle aches. Stuffy nose (nasal congestion). Cough. For stomach (gastrointestinal) viruses: Fever. Pain in the abdomen. Nausea or vomiting. Diarrhea. For liver viruses (hepatitis): Loss of appetite. Feeling tired. Skin or the white parts of your eyes turning yellow (jaundice). For brain and spinal cord viruses: Fever. Headache. Stiff neck. Nausea and vomiting. Confusion or being sleepy. For skin viruses: Warts. Itching. Rash. For sexually transmitted viruses: Discharge. Swelling. Redness. Rash. How is this diagnosed? This condition may be diagnosed based on one or more of these: Your symptoms and medical history. A physical exam. Tests, such as: Blood tests. Tests on a sample of mucus from your lungs (sputum sample). Tests on a poop (stool) sample. Tests on a swab of body fluids or a skin sore (lesion). How is this treated? Viruses can be hard to treat because they live within cells. Antibiotics do not treat viruses because these medicines do not get inside cells. Treatment for a viral illness may include: Resting and drinking a lot of fluids. Medicines to treat symptoms. These can include over-the-counter medicine for pain and fever, medicines for cough or congestion, and medicines for diarrhea. Antiviral medicines. These medicines are available only for certain types of viruses. Some viral illnesses can be prevented with vaccinations. A common example is the flu shot. Follow these instructions at home: Medicines Take over-the-counter and prescription medicines only as told by your health care provider. If you were prescribed an antiviral medicine, take it as told by your provider. Do not stop  taking the antiviral even if you start to feel better. Know when antibiotics are needed and when they are not needed. Antibiotics do not treat viruses. You may get an antibiotic if your provider thinks that you may have, or are at risk  for, a bacterial infection and you have a viral infection. Do not ask for an antibiotic prescription if you have been diagnosed with a viral illness. Antibiotics will not make your illness go away faster. Taking antibiotics when they are not needed can lead to antibiotic resistance. When this develops, the medicine no longer works against the bacteria that it normally fights. General instructions Drink enough fluids to keep your pee (urine) pale yellow. Rest as much as possible. Return to your normal activities as told by your provider. Ask your provider what activities are safe for you. How is this prevented? To lower your risk of getting another viral illness: Wash your hands often with soap and water for at least 20 seconds. If soap and water are not available, use hand sanitizer. Avoid touching your nose, eyes, and mouth, especially if you have not washed your hands recently. If anyone in your household has a viral infection, clean all household surfaces that may have been in contact with the virus. Use soap and hot water. You may also use a commercially prepared, bleach-containing solution. Stay away from people who are sick with symptoms of a viral infection. Do not share items such as toothbrushes and water bottles with other people. Keep your vaccinations up to date. This includes getting a yearly flu shot. Eat a healthy diet and get plenty of rest. Contact a health care provider if: You have symptoms of a viral illness that do not go away. Your symptoms come back after going away. Your symptoms get worse. Get help right away if: You have trouble breathing. You have a severe headache or a stiff neck. You have severe vomiting or pain in your abdomen. These symptoms may be an emergency. Get help right away. Call 911. Do not wait to see if the symptoms will go away. Do not drive yourself to the hospital. This information is not intended to replace advice given to you by your health  care provider. Make sure you discuss any questions you have with your health care provider. Document Revised: 04/02/2022 Document Reviewed: 01/15/2022 Elsevier Patient Education  2024 [employer].

## 2024-04-27 NOTE — Telephone Encounter (Signed)
 FYI Only or Action Required?: FYI only for provider: appointment scheduled on 1/28.  Patient was last seen in primary care on 08/18/2023 by Purcell Emil Schanz, MD.  Called Nurse Triage reporting Sinusitis.  Symptoms began yesterday.  Interventions attempted: Nothing.  Symptoms are: unchanged.  Triage Disposition: See PCP When Office is Open (Within 3 Days)  Patient/caregiver understands and will follow disposition?: Yes      Message from [facility] C sent at 04/27/2024  8:42 AM EST  Reason for Triage: Patient states she is having a severely painful headache. And it feels like her nose and neck is extremely stopped up, feels like she is stopped up and tight from left side of her neck all the way down to her shoulder.   Reason for Disposition  [1] Sinus congestion (pressure, fullness) AND [2] present > 10 days    Less than 10 days.  Answer Assessment - Initial Assessment Questions 1. LOCATION: Where does it hurt?      Across face   2. ONSET: When did the sinus pain start?  (e.g., hours, days)      Yesterday  3. SEVERITY: How bad is the pain?   (Scale 0-10; or none, mild, moderate or severe)     Mild 5/10   4. RECURRENT SYMPTOM: Have you ever had sinus problems before? If Yes, ask: When was the last time? and What happened that time?      Yes, a long time ago   5. NASAL CONGESTION: Is the nose blocked? If Yes, ask: Can you open it or must you breathe through your mouth?     Nose blocked at times, then must breathe through nose   6. NASAL DISCHARGE: Do you have discharge from your nose? If so ask, What color?     Runny nose   7. FEVER: Do you have a fever? If Yes, ask: What is it, how was it measured, and when did it start?      No  8. OTHER SYMPTOMS: Do you have any other symptoms? (e.g., sore throat, cough, earache, difficulty breathing)     Cough-mild, nausea, vertigo-mild      Patient called in to triage with complaints of sinus pain,  headache, nasal congestion, pain in neck and shoulder.  This has been ongoing for one day  The patient stated she has some nausea, and vertigo as well.  For home care, the patient is not taking any OTC medications.   Appointment scheduled for further evaluation; Patient agrees with the plan of care, and will reach out if symptoms worsen or persist.  Protocols used: Sinus Pain or Congestion-A-AH

## 2024-04-27 NOTE — Progress Notes (Signed)
 Autumn Young 79 y.o.   Chief Complaint  Patient presents with   Nasal Congestion    Patient states that she just stated the nasal congestion last night along with a headache and she states that her ears may be clogged up     HISTORY OF PRESENT ILLNESS: Acute problem visit today. This is a 79 y.o. female complaining of flulike symptoms that started last night Woke up with a headache today and nasal congestion No other associated symptoms No other complaints or medical concerns today.  HPI   Prior to Admission medications  Medication Sig Start Date End Date Taking? Authorizing Provider  acetaminophen  (TYLENOL ) 500 MG tablet Take 500 mg by mouth every 6 (six) hours as needed (pain).   Yes [provider]  albuterol  (PROVENTIL  HFA) 108 (90 Base) MCG/ACT inhaler Inhale 2 puffs into the lungs every 6 (six) hours as needed for wheezing or shortness of breath. 02/03/24  Yes Meade Verdon RAMAN, MD  albuterol  (PROVENTIL ) (2.5 MG/3ML) 0.083% nebulizer solution Take 3 mLs (2.5 mg total) by nebulization every 6 (six) hours as needed for wheezing or shortness of breath. 08/14/23  Yes Hope Almarie ORN, NP  apixaban  (ELIQUIS ) 5 MG TABS tablet Take 1 tablet (5 mg total) by mouth 2 (two) times daily. 06/12/23  Yes Lesia Ozell Barter, PA-C  ascorbic acid (VITAMIN C) 1000 MG tablet Take 1,000 mg by mouth daily. 05/14/18  Yes [provider]  benzonatate  (TESSALON ) 200 MG capsule Take 1 capsule (200 mg total) by mouth 3 (three) times daily as needed for cough. 10/09/23  Yes Craig Palma R, PA-C  budesonide -glycopyrrolate-formoterol  (BREZTRI  AEROSPHERE) 160-9-4.8 MCG/ACT AERO inhaler Inhale 2 puffs into the lungs in the morning and at bedtime. 02/03/24  Yes Desai, Nikita S, MD  Cholecalciferol 25 MCG (1000 UT) tablet Take 1,000 Units by mouth in the morning.   Yes [provider]  clotrimazole -betamethasone  (LOTRISONE ) cream Apply 1 Application topically 2 (two) times daily.  12/29/22  Yes Jaydin Boniface, Emil Schanz, MD  Cyanocobalamin  (VITAMIN B-12 PO) Take 6 drops by mouth in the morning.   Yes [provider]  guaifenesin  (ROBITUSSIN) 100 MG/5ML syrup Take 200 mg by mouth 3 (three) times daily as needed for cough.   Yes [provider]  montelukast  (SINGULAIR ) 10 MG tablet Take 1 tablet (10 mg total) by mouth at bedtime. 04/07/23  Yes Rai, Ripudeep K, MD  olmesartan  (BENICAR ) 40 MG tablet TAKE 1 TABLET BY MOUTH EVERY DAY 01/02/24  Yes Chao Blazejewski, Emil Schanz, MD  Omega-3 Fatty Acids (OMEGA-3 PO) Take 1 capsule by mouth in the morning.   Yes [provider]  pantoprazole  (PROTONIX ) 40 MG tablet TAKE 1 TABLET (40 MG TOTAL) BY MOUTH TWICE A DAY BEFORE MEALS 04/08/24  Yes Craig Palma R, PA-C  Red Yeast Rice Extract (RED YEAST RICE PO) Take 2 tablets by mouth daily.   Yes [provider]  torsemide  (DEMADEX ) 20 MG tablet TAKE 1 TABLET BY MOUTH EVERY DAY 03/02/24  Yes Purcell Emil Schanz, MD    Allergies[1]  Patient Active Problem List   Diagnosis Date Noted   B12 deficiency 11/16/2023   Gastroesophageal reflux disease 11/16/2023   Hiatal hernia 11/16/2023   Gastric polyps 11/16/2023   Polyp of transverse colon 11/16/2023   Anemia 08/18/2023   Esophageal dysphagia 02/11/2023   Peripheral edema 09/25/2022   Chronic heart failure with preserved ejection fraction (HCC) 09/25/2022   Morbid obesity (HCC) 09/25/2022   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   Dyslipidemia 08/06/2021  History of CVA (cerebrovascular accident) 06/07/2021   OSA (obstructive sleep apnea) 11/28/2015   COPD (chronic obstructive pulmonary disease) with chronic bronchitis (HCC) 06/26/2015   Pulmonary hypertension (HCC) 06/26/2015   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   Mild intermittent asthma without complication 11/19/2014   Essential hypertension 11/18/2014    Past Medical History:  Diagnosis Date   Asthma    COPD (chronic obstructive pulmonary disease) (HCC)     GERD (gastroesophageal reflux disease)    Hypertension    Stroke [facility])     Past Surgical History:  Procedure Laterality Date   BONE BIOPSY  11/16/2023   Procedure: SHEROLYN BERKE;  Surgeon: Suzann Inocente HERO, MD;  Location: [facility];  Service: Gastroenterology;;   COLONOSCOPY N/A 11/16/2023   Procedure: COLONOSCOPY;  Surgeon: Suzann Inocente HERO, MD;  Location: [facility];  Service: Gastroenterology;  Laterality: N/A;   ESOPHAGOGASTRODUODENOSCOPY N/A 11/16/2023   Procedure: EGD (ESOPHAGOGASTRODUODENOSCOPY);  Surgeon: Suzann Inocente HERO, MD;  Location: [facility];  Service: Gastroenterology;  Laterality: N/A;   PACEMAKER IMPLANT N/A 09/23/2021   Procedure: PACEMAKER IMPLANT;  Surgeon: Cindie Ole DASEN, MD;  Location: [facility] INVASIVE CV LAB;  Service: Cardiovascular;  Laterality: N/A;   POLYPECTOMY  11/16/2023   Procedure: POLYPECTOMY, INTESTINE;  Surgeon: Suzann Inocente HERO, MD;  Location: [facility];  Service: Gastroenterology;;   TUBAL LIGATION      Social History   Socioeconomic History   Marital status: Widowed    Spouse name: Not on file   Number of children: 3   Years of education: Not on file   Highest education level: Not on file  Occupational History   Occupation: retired  Tobacco Use   Smoking status: Former    Current packs/day: 0.00    Average packs/day: 3.0 packs/day for 2.0 years (6.0 ttl pk-yrs)    Types: Cigarettes    Start date: 51    Quit date: 1985    Years since quitting: 41.1   Smokeless tobacco: Never  Vaping Use   Vaping status: Never Used  Substance and Sexual Activity   Alcohol use: No   Drug use: No   Sexual activity: Not on file  Other Topics Concern   Not on file  Social History Narrative   Lives alone/2025  3 children with one deceased.  Right handed. Husband has passed away   Social Drivers of Health   Tobacco Use: Medium Risk (04/27/2024)   Patient History    Smoking Tobacco Use: Former    Smokeless Tobacco Use: Never    Passive  Exposure: Not on file  Financial Resource Strain: Medium Risk (11/19/2023)   Overall Financial Resource Strain (CARDIA)    Difficulty of Paying Living Expenses: Somewhat hard  Food Insecurity: No Food Insecurity (11/19/2023)   Epic    Worried About Radiation Protection Practitioner of Food in the Last Year: Never true    Ran Out of Food in the Last Year: Never true  Transportation Needs: No Transportation Needs (11/19/2023)   Epic    Lack of Transportation (Medical): No    Lack of Transportation (Non-Medical): No  Physical Activity: Inactive (11/19/2023)   Exercise Vital Sign    Days of Exercise per Week: 0 days    Minutes of Exercise per Session: 0 min  Stress: No Stress Concern Present (11/19/2023)   [employer] of Occupational Health - Occupational Stress Questionnaire    Feeling of Stress: Only a little  Social Connections: Moderately Integrated (11/19/2023)   Social Connection and Isolation Panel  Frequency of Communication with Friends and Family: More than three times a week    Frequency of Social Gatherings with Friends and Family: More than three times a week    Attends Religious Services: More than 4 times per year    Active Member of [employer] or Organizations: Yes    Attends Banker Meetings: Never    Marital Status: Widowed  Intimate Partner Violence: Patient Unable To Answer (11/19/2023)   Epic    Fear of Current or Ex-Partner: Patient unable to answer    Emotionally Abused: Patient unable to answer    Physically Abused: Patient unable to answer    Sexually Abused: Patient unable to answer  Depression (PHQ2-9): Low Risk (01/15/2024)   Depression (PHQ2-9)    PHQ-2 Score: 0  Alcohol Screen: Low Risk (11/19/2023)   Alcohol Screen    Last Alcohol Screening Score (AUDIT): 0  Housing: Unknown (11/19/2023)   Epic    Unable to Pay for Housing in the Last Year: No    Number of Times Moved in the Last Year: Not on file    Homeless in the Last Year: No  Utilities: Not At Risk  (11/19/2023)   Epic    Threatened with loss of utilities: No  Health Literacy: Adequate Health Literacy (11/19/2023)   B1300 Health Literacy    Frequency of need for help with medical instructions: Never    Family History  Problem Relation Age of Onset   Cancer Mother        Lung, cervix, mouth   Hypertension Father    Cancer Sister        Colon   Diabetes Sister    Hypertension Sister    Heart disease Sister    Cancer Brother    Hypertension Brother    Liver disease Neg Hx    Esophageal cancer Neg Hx      Review of Systems  Constitutional: Negative.  Negative for chills and fever.  HENT:  Positive for congestion.   Respiratory: Negative.  Negative for cough and shortness of breath.   Cardiovascular: Negative.  Negative for chest pain and palpitations.  Gastrointestinal:  Positive for nausea. Negative for abdominal pain, diarrhea and vomiting.  Genitourinary: Negative.  Negative for dysuria and hematuria.  Skin: Negative.  Negative for rash.  Neurological:  Positive for headaches.  All other systems reviewed and are negative.   Vitals:   04/27/24 1421 04/27/24 1429  BP: (!) 180/100 (!) 180/100  Pulse: 68   Temp: 98.1 F (36.7 C)   SpO2: 94%     Physical Exam Vitals reviewed.  Constitutional:      Appearance: Normal appearance.  HENT:     Head: Normocephalic.     Mouth/Throat:     Mouth: Mucous membranes are moist.     Pharynx: Oropharynx is clear.  Eyes:     Extraocular Movements: Extraocular movements intact.     Pupils: Pupils are equal, round, and reactive to light.  Cardiovascular:     Rate and Rhythm: Normal rate and regular rhythm.     Pulses: Normal pulses.     Heart sounds: Murmur heard.  Pulmonary:     Effort: Pulmonary effort is normal.     Breath sounds: Normal breath sounds.  Musculoskeletal:     Cervical back: No tenderness.  Lymphadenopathy:     Cervical: No cervical adenopathy.  Skin:    General: Skin is warm and dry.     Capillary  Refill: Capillary refill takes  less than 2 seconds.  Neurological:     General: No focal deficit present.     Mental Status: She is alert and oriented to person, place, and time.  Psychiatric:        Mood and Affect: Mood normal.        Behavior: Behavior normal.    Results for orders placed or performed in visit on 04/27/24 (from the past 24 hours)  POCT Influenza A/B     Status: Normal   Collection Time: 04/27/24  2:54 PM  Result Value Ref Range   Influenza A, POC Negative Negative   Influenza B, POC Negative Negative  POC COVID-19     Status: Normal   Collection Time: 04/27/24  2:55 PM  Result Value Ref Range   SARS Coronavirus 2 Ag Negative Negative     ASSESSMENT & PLAN: Problem List Items Addressed This Visit       Other   Flu-like symptoms   Symptom management discussed Advised to rest and stay well-hydrated Take Tylenol  and/or Advil as needed ED precautions given      Viral illness - Primary   Clinically stable.  No red flag signs or symptoms. Negative COVID and flu tests Symptom management discussed. Advised to rest and stay well-hydrated ED precautions given Advised to contact the office if no better or worse during the next several days      Patient Instructions  Viral Illness, Adult Viruses are tiny germs that can get into a person's body and cause illness. There are many different types of viruses. And they cause many types of illness. Viral illnesses can range from mild to severe. They can affect various parts of the body. Short-term conditions that are caused by a virus include colds and flu (influenza) and stomach viruses. Long-term conditions that are caused by a virus include herpes, shingles, and human immunodeficiency virus (HIV) infection. A few viruses have been linked to certain cancers. What are the causes? Many types of viruses can cause illness. Viruses get into cells in your body, multiply, and cause the infected cells to work differently or  die. When these cells die, they release more of the virus. When this happens, you get symptoms of the illness and the virus spreads to other cells. If the virus takes over how the cell works, it can cause the cell to divide and grow out of control. This happens when a virus causes cancer. Different viruses get into the body in different ways. You can get a virus by: Swallowing food or water that has come in contact with the virus. Breathing in droplets that have been coughed or sneezed into the air by an infected person. Touching a surface that has the virus on it and then touching your eyes, nose, or mouth. Being bitten by an insect or animal that carries the virus. Having sexual contact with a person who is infected with the virus. Being exposed to blood or fluids that contain the virus, either through an open cut or during a transfusion. If a virus enters your body, your body's disease-fighting system (immune system) will try to fight the virus. You may be at higher risk for a viral illness if your immune system is weak. What are the signs or symptoms? Symptoms depend on the type of virus and the location of the cells that it gets into. Symptoms can include: For cold and flu viruses: Fever. Headache. Sore throat. Muscle aches. Stuffy nose (nasal congestion). Cough. For stomach (gastrointestinal) viruses: Fever.  Pain in the abdomen. Nausea or vomiting. Diarrhea. For liver viruses (hepatitis): Loss of appetite. Feeling tired. Skin or the white parts of your eyes turning yellow (jaundice). For brain and spinal cord viruses: Fever. Headache. Stiff neck. Nausea and vomiting. Confusion or being sleepy. For skin viruses: Warts. Itching. Rash. For sexually transmitted viruses: Discharge. Swelling. Redness. Rash. How is this diagnosed? This condition may be diagnosed based on one or more of these: Your symptoms and medical history. A physical exam. Tests, such as: Blood  tests. Tests on a sample of mucus from your lungs (sputum sample). Tests on a poop (stool) sample. Tests on a swab of body fluids or a skin sore (lesion). How is this treated? Viruses can be hard to treat because they live within cells. Antibiotics do not treat viruses because these medicines do not get inside cells. Treatment for a viral illness may include: Resting and drinking a lot of fluids. Medicines to treat symptoms. These can include over-the-counter medicine for pain and fever, medicines for cough or congestion, and medicines for diarrhea. Antiviral medicines. These medicines are available only for certain types of viruses. Some viral illnesses can be prevented with vaccinations. A common example is the flu shot. Follow these instructions at home: Medicines Take over-the-counter and prescription medicines only as told by your health care provider. If you were prescribed an antiviral medicine, take it as told by your provider. Do not stop taking the antiviral even if you start to feel better. Know when antibiotics are needed and when they are not needed. Antibiotics do not treat viruses. You may get an antibiotic if your provider thinks that you may have, or are at risk for, a bacterial infection and you have a viral infection. Do not ask for an antibiotic prescription if you have been diagnosed with a viral illness. Antibiotics will not make your illness go away faster. Taking antibiotics when they are not needed can lead to antibiotic resistance. When this develops, the medicine no longer works against the bacteria that it normally fights. General instructions Drink enough fluids to keep your pee (urine) pale yellow. Rest as much as possible. Return to your normal activities as told by your provider. Ask your provider what activities are safe for you. How is this prevented? To lower your risk of getting another viral illness: Wash your hands often with soap and water for at least 20  seconds. If soap and water are not available, use hand sanitizer. Avoid touching your nose, eyes, and mouth, especially if you have not washed your hands recently. If anyone in your household has a viral infection, clean all household surfaces that may have been in contact with the virus. Use soap and hot water. You may also use a commercially prepared, bleach-containing solution. Stay away from people who are sick with symptoms of a viral infection. Do not share items such as toothbrushes and water bottles with other people. Keep your vaccinations up to date. This includes getting a yearly flu shot. Eat a healthy diet and get plenty of rest. Contact a health care provider if: You have symptoms of a viral illness that do not go away. Your symptoms come back after going away. Your symptoms get worse. Get help right away if: You have trouble breathing. You have a severe headache or a stiff neck. You have severe vomiting or pain in your abdomen. These symptoms may be an emergency. Get help right away. Call 911. Do not wait to see if the symptoms will  go away. Do not drive yourself to the hospital. This information is not intended to replace advice given to you by your health care provider. Make sure you discuss any questions you have with your health care provider. Document Revised: 04/02/2022 Document Reviewed: 01/15/2022 Elsevier Patient Education  2024 Elsevier Inc.    Emil Schaumann, MD [facility]     [1]  Allergies Allergen Reactions   Shellfish Allergy Anaphylaxis   Zocor [Simvastatin] Swelling   Lasix [Furosemide] Swelling   Latex Rash   Penicillins Rash

## 2024-06-06 ENCOUNTER — Inpatient Hospital Stay: Admitting: Nurse Practitioner

## 2024-06-21 ENCOUNTER — Ambulatory Visit

## 2024-09-20 ENCOUNTER — Ambulatory Visit

## 2024-12-20 ENCOUNTER — Ambulatory Visit

## 2025-03-21 ENCOUNTER — Ambulatory Visit
